# Patient Record
Sex: Female | Born: 1980 | Race: White | Hispanic: No | Marital: Married | Smoking: Never smoker
Health system: Southern US, Community
[De-identification: ages and names within clinical notes are randomized; demographics above are authoritative.]

## PROBLEM LIST (undated history)

## (undated) DIAGNOSIS — I10 Essential (primary) hypertension: Secondary | ICD-10-CM

## (undated) DIAGNOSIS — N809 Endometriosis, unspecified: Secondary | ICD-10-CM

## (undated) DIAGNOSIS — G43909 Migraine, unspecified, not intractable, without status migrainosus: Secondary | ICD-10-CM

## (undated) DIAGNOSIS — E282 Polycystic ovarian syndrome: Secondary | ICD-10-CM

## (undated) HISTORY — PX: OTHER SURGICAL HISTORY: SHX169

## (undated) HISTORY — DX: Polycystic ovarian syndrome: E28.2

## (undated) HISTORY — DX: Migraine, unspecified, not intractable, without status migrainosus: G43.909

## (undated) HISTORY — PX: ADENOIDECTOMY: SUR15

## (undated) HISTORY — DX: Endometriosis, unspecified: N80.9

## (undated) HISTORY — DX: Essential (primary) hypertension: I10

---
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
## 2001-12-31 ENCOUNTER — Other Ambulatory Visit: Admission: RE | Admit: 2001-12-31 | Discharge: 2001-12-31 | Payer: Self-pay | Admitting: *Deleted

## 2002-05-21 ENCOUNTER — Other Ambulatory Visit: Admission: RE | Admit: 2002-05-21 | Discharge: 2002-05-21 | Payer: Self-pay | Admitting: *Deleted

## 2002-10-18 ENCOUNTER — Other Ambulatory Visit: Admission: RE | Admit: 2002-10-18 | Discharge: 2002-10-18 | Payer: Self-pay | Admitting: *Deleted

## 2003-03-20 ENCOUNTER — Other Ambulatory Visit: Admission: RE | Admit: 2003-03-20 | Discharge: 2003-03-20 | Payer: Self-pay | Admitting: *Deleted

## 2006-12-27 ENCOUNTER — Other Ambulatory Visit: Admission: RE | Admit: 2006-12-27 | Discharge: 2006-12-27 | Payer: Self-pay | Admitting: *Deleted

## 2007-06-27 ENCOUNTER — Ambulatory Visit: Payer: Self-pay | Admitting: Family Medicine

## 2007-10-25 HISTORY — PX: WISDOM TOOTH EXTRACTION: SHX21

## 2008-01-09 ENCOUNTER — Other Ambulatory Visit: Admission: RE | Admit: 2008-01-09 | Discharge: 2008-01-09 | Payer: Self-pay | Admitting: *Deleted

## 2009-04-14 ENCOUNTER — Ambulatory Visit: Payer: Self-pay | Admitting: Internal Medicine

## 2015-11-30 ENCOUNTER — Other Ambulatory Visit: Payer: Self-pay | Admitting: Obstetrics and Gynecology

## 2015-11-30 DIAGNOSIS — N644 Mastodynia: Secondary | ICD-10-CM

## 2015-12-03 ENCOUNTER — Ambulatory Visit
Admission: RE | Admit: 2015-12-03 | Discharge: 2015-12-03 | Disposition: A | Discharge status: Home / Self Care | Payer: BLUE CROSS/BLUE SHIELD | Source: Ambulatory Visit | Attending: Obstetrics and Gynecology | Admitting: Obstetrics and Gynecology

## 2015-12-03 ENCOUNTER — Other Ambulatory Visit: Payer: Self-pay | Admitting: Obstetrics and Gynecology

## 2015-12-03 DIAGNOSIS — N644 Mastodynia: Secondary | ICD-10-CM

## 2016-02-04 DIAGNOSIS — H5203 Hypermetropia, bilateral: Secondary | ICD-10-CM | POA: Diagnosis not present

## 2016-08-02 DIAGNOSIS — M531 Cervicobrachial syndrome: Secondary | ICD-10-CM | POA: Diagnosis not present

## 2016-08-02 DIAGNOSIS — M9902 Segmental and somatic dysfunction of thoracic region: Secondary | ICD-10-CM | POA: Diagnosis not present

## 2016-08-02 DIAGNOSIS — M5386 Other specified dorsopathies, lumbar region: Secondary | ICD-10-CM | POA: Diagnosis not present

## 2016-08-16 ENCOUNTER — Ambulatory Visit
Admission: RE | Admit: 2016-08-16 | Discharge: 2016-08-16 | Disposition: A | Discharge status: Home / Self Care | Payer: BLUE CROSS/BLUE SHIELD | Source: Ambulatory Visit | Attending: Physician Assistant | Admitting: Physician Assistant

## 2016-08-16 ENCOUNTER — Other Ambulatory Visit: Payer: Self-pay | Admitting: Physician Assistant

## 2016-08-16 DIAGNOSIS — M546 Pain in thoracic spine: Secondary | ICD-10-CM | POA: Diagnosis not present

## 2016-08-16 DIAGNOSIS — M5387 Other specified dorsopathies, lumbosacral region: Secondary | ICD-10-CM | POA: Diagnosis not present

## 2016-08-16 DIAGNOSIS — R52 Pain, unspecified: Secondary | ICD-10-CM

## 2016-08-16 DIAGNOSIS — R079 Chest pain, unspecified: Secondary | ICD-10-CM | POA: Diagnosis not present

## 2016-08-16 DIAGNOSIS — M5442 Lumbago with sciatica, left side: Secondary | ICD-10-CM | POA: Diagnosis not present

## 2016-08-16 DIAGNOSIS — R0789 Other chest pain: Secondary | ICD-10-CM | POA: Diagnosis not present

## 2016-08-16 DIAGNOSIS — E559 Vitamin D deficiency, unspecified: Secondary | ICD-10-CM | POA: Diagnosis not present

## 2016-08-16 DIAGNOSIS — R635 Abnormal weight gain: Secondary | ICD-10-CM | POA: Diagnosis not present

## 2016-08-16 DIAGNOSIS — R945 Abnormal results of liver function studies: Secondary | ICD-10-CM | POA: Diagnosis not present

## 2016-08-16 DIAGNOSIS — M5441 Lumbago with sciatica, right side: Secondary | ICD-10-CM | POA: Diagnosis not present

## 2016-08-16 DIAGNOSIS — R0781 Pleurodynia: Secondary | ICD-10-CM | POA: Diagnosis not present

## 2016-09-20 DIAGNOSIS — M531 Cervicobrachial syndrome: Secondary | ICD-10-CM | POA: Diagnosis not present

## 2016-09-20 DIAGNOSIS — M5387 Other specified dorsopathies, lumbosacral region: Secondary | ICD-10-CM | POA: Diagnosis not present

## 2016-09-20 DIAGNOSIS — M7541 Impingement syndrome of right shoulder: Secondary | ICD-10-CM | POA: Diagnosis not present

## 2016-12-06 DIAGNOSIS — Z6835 Body mass index (BMI) 35.0-35.9, adult: Secondary | ICD-10-CM | POA: Diagnosis not present

## 2016-12-06 DIAGNOSIS — Z01419 Encounter for gynecological examination (general) (routine) without abnormal findings: Secondary | ICD-10-CM | POA: Diagnosis not present

## 2016-12-06 DIAGNOSIS — Z31430 Encounter of female for testing for genetic disease carrier status for procreative management: Secondary | ICD-10-CM | POA: Diagnosis not present

## 2016-12-06 DIAGNOSIS — R350 Frequency of micturition: Secondary | ICD-10-CM | POA: Diagnosis not present

## 2017-01-25 DIAGNOSIS — L659 Nonscarring hair loss, unspecified: Secondary | ICD-10-CM | POA: Diagnosis not present

## 2017-06-22 DIAGNOSIS — Z30432 Encounter for removal of intrauterine contraceptive device: Secondary | ICD-10-CM | POA: Diagnosis not present

## 2017-12-08 DIAGNOSIS — N926 Irregular menstruation, unspecified: Secondary | ICD-10-CM | POA: Diagnosis not present

## 2017-12-15 DIAGNOSIS — R945 Abnormal results of liver function studies: Secondary | ICD-10-CM | POA: Diagnosis not present

## 2017-12-20 DIAGNOSIS — Z8049 Family history of malignant neoplasm of other genital organs: Secondary | ICD-10-CM | POA: Diagnosis not present

## 2017-12-20 DIAGNOSIS — Z8041 Family history of malignant neoplasm of ovary: Secondary | ICD-10-CM | POA: Diagnosis not present

## 2017-12-20 DIAGNOSIS — Z01419 Encounter for gynecological examination (general) (routine) without abnormal findings: Secondary | ICD-10-CM | POA: Diagnosis not present

## 2017-12-20 DIAGNOSIS — Z801 Family history of malignant neoplasm of trachea, bronchus and lung: Secondary | ICD-10-CM | POA: Diagnosis not present

## 2017-12-20 DIAGNOSIS — Z8042 Family history of malignant neoplasm of prostate: Secondary | ICD-10-CM | POA: Diagnosis not present

## 2017-12-20 DIAGNOSIS — Z6836 Body mass index (BMI) 36.0-36.9, adult: Secondary | ICD-10-CM | POA: Diagnosis not present

## 2017-12-27 ENCOUNTER — Ambulatory Visit: Payer: BLUE CROSS/BLUE SHIELD | Admitting: Internal Medicine

## 2017-12-27 ENCOUNTER — Encounter: Payer: Self-pay | Admitting: Internal Medicine

## 2017-12-27 VITALS — BP 122/80 | HR 88 | Resp 16 | Ht 63.0 in | Wt 218.2 lb

## 2017-12-27 DIAGNOSIS — R635 Abnormal weight gain: Secondary | ICD-10-CM | POA: Diagnosis not present

## 2017-12-27 DIAGNOSIS — R5383 Other fatigue: Secondary | ICD-10-CM

## 2017-12-27 DIAGNOSIS — R748 Abnormal levels of other serum enzymes: Secondary | ICD-10-CM

## 2017-12-27 NOTE — Progress Notes (Signed)
[facility] [place], [place] [address]  Internal MEDICINE  Office Visit Note  Patient Name: Jennifer Barron  [id number]  [id number]  Date of Service: 12/28/2017   Complaints/HPI Pt is here for establishment of PCP. She had her labs done by her obgyn which showed elevated transaminases. She does need take any medications and concerned about it. No h/o of increased risk factors for this. She is concerned about her fatigue and weight gain. Denies any problems with her menses.   Current Medication: No outpatient encounter medications on file as of 12/27/2017.   No facility-administered encounter medications on file as of 12/27/2017.     Surgical History: Past Surgical History:  Procedure Laterality Date  . abdominal laproscopy    . ADENOIDECTOMY     twice  . TONSILLECTOMY Bilateral 1987  . WISDOM TOOTH EXTRACTION  2009    Medical History: Past Medical History:  Diagnosis Date  . Endometriosis     Family History: Family History  Problem Relation Age of Onset  . Ovarian cancer Paternal Grandmother   . Prostate cancer Paternal Grandfather     Social History   Socioeconomic History  . Marital status: Married    Spouse name: Not on file  . Number of children: Not on file  . Years of education: Not on file  . Highest education level: Not on file  Social Needs  . Financial resource strain: Not on file  . Food insecurity - worry: Not on file  . Food insecurity - inability: Not on file  . Transportation needs - medical: Not on file  . Transportation needs - non-medical: Not on file  Occupational History  . Not on file  Tobacco Use  . Smoking status: Never Smoker  . Smokeless tobacco: Never Used  Substance and Sexual Activity  . Alcohol use: Yes    Frequency: Never    Comment: social  . Drug use: No  . Sexual activity: Not on file  Other Topics Concern  . Not on file  Social History Narrative  . Not on file     Review of Systems   Constitutional: Positive for fatigue and unexpected weight change. Negative for chills.  HENT: Negative for congestion, postnasal drip, rhinorrhea, sneezing and sore throat.   Eyes: Negative for redness.  Respiratory: Negative for cough, chest tightness and shortness of breath.   Cardiovascular: Negative for chest pain and palpitations.  Gastrointestinal: Negative for abdominal pain, constipation, diarrhea, nausea and vomiting.  Genitourinary: Negative for dysuria and frequency.  Musculoskeletal: Negative for arthralgias, back pain, joint swelling and neck pain.  Skin: Negative for rash.  Neurological: Negative.  Negative for tremors and numbness.  Hematological: Negative for adenopathy. Does not bruise/bleed easily.  Psychiatric/Behavioral: Negative for behavioral problems (Depression), sleep disturbance and suicidal ideas. The patient is not nervous/anxious.     Vital Signs: BP 122/80   Pulse 88   Resp 16   Ht 5' 3"  (1.6 m)   Wt 218 lb 3.2 oz (99 kg)   SpO2 98%   BMI 38.65 kg/m    Physical Exam  Constitutional: She is oriented to person, place, and time. She appears well-developed and well-nourished. No distress.  HENT:  Head: Normocephalic and atraumatic.  Mouth/Throat: Oropharynx is clear and moist.  Eyes: EOM are normal. Pupils are equal, round, and reactive to light.  Neck: Normal range of motion. Neck supple.  Cardiovascular: Normal rate, regular rhythm and normal heart sounds. Exam reveals no gallop and no  friction rub.  No murmur heard. Pulmonary/Chest: Effort normal.  Abdominal: Soft. Bowel sounds are normal.  Musculoskeletal: Normal range of motion.  Neurological: She is alert and oriented to person, place, and time. No cranial nerve deficit.  Skin: Skin is warm and dry. She is not diaphoretic.  Psychiatric: She has a normal mood and affect. Her behavior is normal. Judgment and thought content normal.    Assessment/Plan: 1. Abnormal liver enzymes -  Fe+TIBC+Fer - Hepatitis B Surface AntiGEN - Hepatitis c vrs RNA detect by PCR-qual - US Abdomen Complete; Future  2. Abnormal weight gain - Fe+TIBC+Fer - TSH+T4F+T3Free - Thyroglobulin antibody - Thyroid peroxidase antibody  3. Fatigue, unspecified type - Fe+TIBC+Fer - B12 and Folate Panel  General Counseling: Jennifer Barron verbalizes understanding of the findings of todays visit and agrees with plan of treatment. I have discussed any further diagnostic evaluation that may be needed or ordered today. We also reviewed her medications today. she has been encouraged to call the office with any questions or concerns that should arise related to todays visit. Obesity Counseling: Risk Assessment: An assessment of behavioral risk factors was made today and includes lack of exercise sedentary lifestyle, lack of portion control and poor dietary habits.  Risk Modification Advice: She was counseled on portion control guidelines. Restricting daily caloric intake to. . The detrimental long term effects of obesity on her health and ongoing poor compliance was also discussed with the patient.   Orders Placed This Encounter  Procedures  . US Abdomen Complete  . Fe+TIBC+Fer  . Hepatitis B Surface AntiGEN  . Hepatitis c vrs RNA detect by PCR-qual  . TSH+T4F+T3Free  . Thyroglobulin antibody  . Thyroid peroxidase antibody    Time spent:25 Minutes

## 2017-12-28 ENCOUNTER — Encounter: Payer: Self-pay | Admitting: Internal Medicine

## 2018-01-08 ENCOUNTER — Other Ambulatory Visit: Payer: Self-pay

## 2018-01-12 ENCOUNTER — Ambulatory Visit (INDEPENDENT_AMBULATORY_CARE_PROVIDER_SITE_OTHER): Payer: BLUE CROSS/BLUE SHIELD

## 2018-01-12 DIAGNOSIS — R5383 Other fatigue: Secondary | ICD-10-CM | POA: Diagnosis not present

## 2018-01-12 DIAGNOSIS — R748 Abnormal levels of other serum enzymes: Secondary | ICD-10-CM | POA: Diagnosis not present

## 2018-01-12 DIAGNOSIS — R635 Abnormal weight gain: Secondary | ICD-10-CM | POA: Diagnosis not present

## 2018-01-12 LAB — HM HEPATITIS C SCREENING LAB: HM Hepatitis Screen: NEGATIVE

## 2018-01-13 LAB — B12 AND FOLATE PANEL
Folate: 4.7 ng/mL (ref >3.0)
Vitamin B-12: 712 pg/mL (ref 232–1245)

## 2018-01-14 LAB — IRON,TIBC AND FERRITIN PANEL
Ferritin: 547 ng/mL — ABNORMAL HIGH (ref 15–150)
Iron Saturation: 33 % (ref 15–55)
Iron: 121 ug/dL (ref 27–159)
Total Iron Binding Capacity: 365 ug/dL (ref 250–450)
UIBC: 244 ug/dL (ref 131–425)

## 2018-01-14 LAB — HEPATITIS B SURFACE ANTIGEN: HEP B S AG: NEGATIVE

## 2018-01-14 LAB — TSH+T4F+T3FREE
Free T4: 1.36 ng/dL (ref 0.82–1.77)
T3, Free: 3.5 pg/mL (ref 2.0–4.4)
TSH: 1.9 u[IU]/mL (ref 0.450–4.500)

## 2018-01-14 LAB — HEPATITIS C VRS RNA DETECT BY PCR-QUAL: HCV RNA NAA QUALITATIVE: NEGATIVE

## 2018-01-15 ENCOUNTER — Other Ambulatory Visit: Payer: Self-pay

## 2018-01-23 ENCOUNTER — Encounter: Payer: Self-pay | Admitting: Internal Medicine

## 2018-01-23 ENCOUNTER — Ambulatory Visit: Payer: BLUE CROSS/BLUE SHIELD | Admitting: Internal Medicine

## 2018-01-23 VITALS — BP 126/90 | HR 83 | Resp 16 | Ht 62.0 in | Wt 213.8 lb

## 2018-01-23 DIAGNOSIS — K760 Fatty (change of) liver, not elsewhere classified: Secondary | ICD-10-CM | POA: Diagnosis not present

## 2018-01-23 DIAGNOSIS — R7989 Other specified abnormal findings of blood chemistry: Secondary | ICD-10-CM

## 2018-01-23 DIAGNOSIS — N809 Endometriosis, unspecified: Secondary | ICD-10-CM | POA: Insufficient documentation

## 2018-01-23 MED ORDER — PHENTERMINE HCL 37.5 MG PO CAPS: 37.5000 mg | ORAL_CAPSULE | ORAL | 0 refills | Status: DC

## 2018-01-23 NOTE — Progress Notes (Signed)
[facility] [place], [place] [address]  Internal MEDICINE  Office Visit Note  Patient Name: Jennifer Barron  [id number]  [id number]  Date of Service: 02/01/2018  Chief Complaint  Patient presents with  . Results    labs, ultrasound    HPI Pt is here for routine follow up.  She had her labs done by her obgyn which showed elevated transaminases. She does need take any medications and concerned about it. No h/o of increased risk factors for this. She is concerned about her fatigue and weight gain. Will like to start weight loss programe Denies any problems with her menses. Ultrasound of the abdomen was ordered along with hepatic profile. Pt is here to discuss result  Current Medication: Outpatient Encounter Medications as of 01/23/2018  Medication Sig  . phentermine 37.5 MG capsule Take 1 capsule (37.5 mg total) by mouth every morning. (Patient not taking: Reported on 01/31/2018)   No facility-administered encounter medications on file as of 01/23/2018.     Surgical History: Past Surgical History:  Procedure Laterality Date  . abdominal laproscopy    . ADENOIDECTOMY     twice  . TONSILLECTOMY Bilateral 1987  . WISDOM TOOTH EXTRACTION  2009    Medical History: Past Medical History:  Diagnosis Date  . Endometriosis   . Migraine     Family History: Family History  Problem Relation Age of Onset  . Ovarian cancer Paternal Grandmother   . Cancer Paternal Grandmother   . Prostate cancer Paternal Grandfather   . Cancer Paternal Grandfather   . Cancer Father     Social History   Socioeconomic History  . Marital status: Married    Spouse name: Not on file  . Number of children: Not on file  . Years of education: Not on file  . Highest education level: Not on file  Occupational History  . Not on file  Social Needs  . Financial resource strain: Not on file  . Food insecurity:    Worry: Not on file    Inability: Not on file  .  Transportation needs:    Medical: Not on file    Non-medical: Not on file  Tobacco Use  . Smoking status: Never Smoker  . Smokeless tobacco: Never Used  Substance and Sexual Activity  . Alcohol use: Yes    Frequency: Never    Comment: social  . Drug use: No  . Sexual activity: Yes  Lifestyle  . Physical activity:    Days per week: Not on file    Minutes per session: Not on file  . Stress: Not on file  Relationships  . Social connections:    Talks on phone: Not on file    Gets together: Not on file    Attends religious service: Not on file    Active member of club or organization: Not on file    Attends meetings of clubs or organizations: Not on file    Relationship status: Not on file  . Intimate partner violence:    Fear of current or ex partner: Not on file    Emotionally abused: Not on file    Physically abused: Not on file    Forced sexual activity: Not on file  Other Topics Concern  . Not on file  Social History Narrative  . Not on file   Review of Systems  Constitutional: Positive for fatigue and unexpected weight change. Negative for chills.  HENT: Negative for congestion, postnasal drip, rhinorrhea,  sneezing and sore throat.   Eyes: Negative for redness.  Respiratory: Negative for cough, chest tightness and shortness of breath.   Cardiovascular: Negative for chest pain and palpitations.  Gastrointestinal: Negative for abdominal pain, constipation, diarrhea, nausea and vomiting.  Genitourinary: Negative for dysuria and frequency.  Musculoskeletal: Negative for arthralgias, back pain, joint swelling and neck pain.  Skin: Negative for rash.  Neurological: Negative.  Negative for tremors and numbness.  Hematological: Negative for adenopathy. Does not bruise/bleed easily.  Psychiatric/Behavioral: Negative for behavioral problems (Depression), sleep disturbance and suicidal ideas. The patient is not nervous/anxious.     Vital Signs: BP 126/90 (BP Location: Left  Arm, Patient Position: Sitting)   Pulse 83   Resp 16   Ht 5' 2"  (1.575 m)   Wt 213 lb 12.8 oz (97 kg)   SpO2 98%   BMI 39.10 kg/m    Physical Exam  Constitutional: She is oriented to person, place, and time. She appears well-developed and well-nourished. No distress.  HENT:  Head: Normocephalic and atraumatic.  Mouth/Throat: Oropharynx is clear and moist.  Eyes: EOM are normal. Pupils are equal, round, and reactive to light.  Neck: Normal range of motion. Neck supple.  Cardiovascular: Normal rate, regular rhythm and normal heart sounds. Exam reveals no gallop and no friction rub.  No murmur heard. Pulmonary/Chest: Effort normal.  Abdominal: Soft. Bowel sounds are normal.  Musculoskeletal: Normal range of motion.  Neurological: She is alert and oriented to person, place, and time. No cranial nerve deficit.  Skin: Skin is warm and dry. She is not diaphoretic.  Psychiatric: She has a normal mood and affect. Her behavior is normal. Judgment and thought content normal.  Assessment/Plan: 1. Elevated ferritin - Ambulatory referral to Hematology.   2. Fatty liver disease, nonalcoholic - Monitor for now   3. Morbid obesity (HCC) - phentermine 37.5 MG capsule; Take 1 capsule (37.5 mg total) by mouth every morning. (Patient not taking: Reported on 01/31/2018)  Dispense: 30 capsule; Refill: 0 - Metabolic Test   General Counseling: Jennifer Barron verbalizes understanding of the findings of todays visit and agrees with plan of treatment. I have discussed any further diagnostic evaluation that may be needed or ordered today. We also reviewed her medications today. she has been encouraged to call the office with any questions or concerns that should arise related to todays visit. Obesity Counseling: Risk Assessment: An assessment of behavioral risk factors was made today and includes lack of exercise sedentary lifestyle, lack of portion control and poor dietary habits.  Risk Modification Advice: She  was counseled on portion control guidelines. Restricting daily caloric intake to. . The detrimental long term effects of obesity on her health and ongoing poor compliance was also discussed with the patient.   Orders Placed This Encounter  Procedures  . Metabolic Test  . Ambulatory referral to Hematology    Meds ordered this encounter  Medications  . phentermine 37.5 MG capsule    Sig: Take 1 capsule (37.5 mg total) by mouth every morning.    Dispense:  30 capsule    Refill:  0    Time spent:25 Minutes  Dr Lavera Guise Internal medicine

## 2018-01-24 DIAGNOSIS — L700 Acne vulgaris: Secondary | ICD-10-CM | POA: Diagnosis not present

## 2018-01-24 DIAGNOSIS — D2272 Melanocytic nevi of left lower limb, including hip: Secondary | ICD-10-CM | POA: Diagnosis not present

## 2018-01-24 DIAGNOSIS — D2261 Melanocytic nevi of right upper limb, including shoulder: Secondary | ICD-10-CM | POA: Diagnosis not present

## 2018-01-24 DIAGNOSIS — D2262 Melanocytic nevi of left upper limb, including shoulder: Secondary | ICD-10-CM | POA: Diagnosis not present

## 2018-01-31 ENCOUNTER — Inpatient Hospital Stay: Payer: BLUE CROSS/BLUE SHIELD | Attending: Oncology | Admitting: Oncology

## 2018-01-31 ENCOUNTER — Other Ambulatory Visit: Payer: Self-pay

## 2018-01-31 ENCOUNTER — Inpatient Hospital Stay: Payer: BLUE CROSS/BLUE SHIELD

## 2018-01-31 ENCOUNTER — Encounter: Payer: Self-pay | Admitting: Oncology

## 2018-01-31 VITALS — BP 138/93 | HR 86 | Temp 98.5°F | Resp 16 | Wt 214.2 lb

## 2018-01-31 DIAGNOSIS — R7989 Other specified abnormal findings of blood chemistry: Secondary | ICD-10-CM

## 2018-01-31 DIAGNOSIS — R740 Nonspecific elevation of levels of transaminase and lactic acid dehydrogenase [LDH]: Secondary | ICD-10-CM | POA: Diagnosis not present

## 2018-01-31 DIAGNOSIS — R17 Unspecified jaundice: Secondary | ICD-10-CM

## 2018-01-31 DIAGNOSIS — E559 Vitamin D deficiency, unspecified: Secondary | ICD-10-CM | POA: Diagnosis not present

## 2018-01-31 LAB — CBC WITH DIFFERENTIAL/PLATELET
BASOS ABS: 0 10*3/uL (ref 0–0.1)
BASOS PCT: 1 %
Eosinophils Absolute: 0.2 10*3/uL (ref 0–0.7)
Eosinophils Relative: 3 %
HEMATOCRIT: 43.5 % (ref 35.0–47.0)
HEMOGLOBIN: 15.8 g/dL (ref 12.0–16.0)
LYMPHS PCT: 34 %
Lymphs Abs: 2.5 10*3/uL (ref 1.0–3.6)
MCH: 34.1 pg — ABNORMAL HIGH (ref 26.0–34.0)
MCHC: 36.2 g/dL — ABNORMAL HIGH (ref 32.0–36.0)
MCV: 94.2 fL (ref 80.0–100.0)
MONOS PCT: 9 %
Monocytes Absolute: 0.6 10*3/uL (ref 0.2–0.9)
NEUTROS ABS: 3.8 10*3/uL (ref 1.4–6.5)
NEUTROS PCT: 53 %
Platelets: 289 10*3/uL (ref 150–440)
RBC: 4.62 MIL/uL (ref 3.80–5.20)
RDW: 11.9 % (ref 11.5–14.5)
WBC: 7.2 10*3/uL (ref 3.6–11.0)

## 2018-01-31 LAB — COMPREHENSIVE METABOLIC PANEL
ALT: 282 U/L — ABNORMAL HIGH (ref 14–54)
ANION GAP: 8 (ref 5–15)
AST: 184 U/L — ABNORMAL HIGH (ref 15–41)
Albumin: 4.7 g/dL (ref 3.5–5.0)
Alkaline Phosphatase: 57 U/L (ref 38–126)
BILIRUBIN TOTAL: 1.4 mg/dL — AB (ref 0.3–1.2)
BUN: 9 mg/dL (ref 6–20)
CALCIUM: 9.6 mg/dL (ref 8.9–10.3)
CO2: 25 mmol/L (ref 22–32)
CREATININE: 0.81 mg/dL (ref 0.44–1.00)
Chloride: 99 mmol/L — ABNORMAL LOW (ref 101–111)
Glucose, Bld: 103 mg/dL — ABNORMAL HIGH (ref 65–99)
Potassium: 4 mmol/L (ref 3.5–5.1)
Sodium: 132 mmol/L — ABNORMAL LOW (ref 135–145)
TOTAL PROTEIN: 8 g/dL (ref 6.5–8.1)

## 2018-01-31 NOTE — Progress Notes (Signed)
New patient in for evaluation elevated ferritin.  Pt reports being tired and fatigue, some shortness of breath on exertion.

## 2018-02-01 LAB — PTH, INTACT AND CALCIUM
CALCIUM TOTAL (PTH): 9.8 mg/dL (ref 8.7–10.2)
PTH: 42 pg/mL (ref 15–65)

## 2018-02-01 LAB — VITAMIN D 25 HYDROXY (VIT D DEFICIENCY, FRACTURES): Vit D, 25-Hydroxy: 15.1 ng/mL — ABNORMAL LOW (ref 30.0–100.0)

## 2018-02-01 MED ORDER — VITAMIN D 1000 UNITS PO TABS: 1000.0000 [IU] | ORAL_TABLET | Freq: Every day | ORAL | 0 refills | Status: AC

## 2018-02-01 NOTE — Progress Notes (Signed)
Hematology/Oncology Consult note [facility] [phone] [phone]   Patient Care Team: Lavera Guise, MD as PCP - General (Internal Medicine)  REFERRING PROVIDER: Lavera Guise, MD CHIEF COMPLAINTS/PURPOSE OF CONSULTATION:  Evaluation of elevated ferritin.  HISTORY OF PRESENTING ILLNESS:  Jennifer Barron Morning is a  37 y.o.  female with PMH listed below who was referred to me for evaluation of elevated ferritin. Patient follows up with GYN for IUD placement, and was fine to have abnormal liver function test, mainly elevated transaminitis.  Patient went to primary care physician Dr. Humphrey Rolls And had additional testing.  She was found to have elevated ferritin level at 547, iron saturation 33, ultrasound of abdomen was done on January 12, 2018, which showed upper normal limits spleen size and increased echogenicity of liver questionable fatty liver. Patient was referred to [facility] for further evaluation of elevated ferritin. Currently patient takes spironolactone for acne and this was started by dermatologist a week ago.  She also takes phentermine for obesity, recently started. Patient reports feeling tired fatigue.  Denies any joint pain shortness of breath, lower extremity swelling.  She denies any family history of hemochromatosis. Review of Systems  Constitutional: Positive for malaise/fatigue. Negative for chills, fever and weight loss.  HENT: Negative for hearing loss and nosebleeds.   Eyes: Negative for double vision and photophobia.  Cardiovascular: Negative for chest pain, orthopnea and claudication.  Gastrointestinal: Negative for nausea and vomiting.  Genitourinary: Negative for dysuria and urgency.  Musculoskeletal: Negative for myalgias and neck pain.  Skin: Negative for rash.  Neurological: Negative for dizziness, tingling and tremors.  Endo/Heme/Allergies: Does not bruise/bleed easily.  Psychiatric/Behavioral: Negative for depression and  substance abuse.    MEDICAL HISTORY:  Past Medical History:  Diagnosis Date  . Endometriosis   . Migraine     SURGICAL HISTORY: Past Surgical History:  Procedure Laterality Date  . abdominal laproscopy    . ADENOIDECTOMY     twice  . TONSILLECTOMY Bilateral 1987  . WISDOM TOOTH EXTRACTION  2009    SOCIAL HISTORY: Social History   Socioeconomic History  . Marital status: Married    Spouse name: Not on file  . Number of children: Not on file  . Years of education: Not on file  . Highest education level: Not on file  Occupational History  . Not on file  Social Needs  . Financial resource strain: Not on file  . Food insecurity:    Worry: Not on file    Inability: Not on file  . Transportation needs:    Medical: Not on file    Non-medical: Not on file  Tobacco Use  . Smoking status: Never Smoker  . Smokeless tobacco: Never Used  Substance and Sexual Activity  . Alcohol use: Yes    Frequency: Never    Comment: social  . Drug use: No  . Sexual activity: Yes  Lifestyle  . Physical activity:    Days per week: Not on file    Minutes per session: Not on file  . Stress: Not on file  Relationships  . Social connections:    Talks on phone: Not on file    Gets together: Not on file    Attends religious service: Not on file    Active member of club or organization: Not on file    Attends meetings of clubs or organizations: Not on file    Relationship status: Not on file  . Intimate partner violence:  Fear of current or ex partner: Not on file    Emotionally abused: Not on file    Physically abused: Not on file    Forced sexual activity: Not on file  Other Topics Concern  . Not on file  Social History Narrative  . Not on file    FAMILY HISTORY: Family History  Problem Relation Age of Onset  . Ovarian cancer Paternal Grandmother   . Cancer Paternal Grandmother   . Prostate cancer Paternal Grandfather   . Cancer Paternal Grandfather   . Cancer Father      ALLERGIES:  has No Known Allergies.  MEDICATIONS:  Current Outpatient Medications  Medication Sig Dispense Refill  . spironolactone (ALDACTONE) 50 MG tablet   4  . phentermine 37.5 MG capsule Take 1 capsule (37.5 mg total) by mouth every morning. (Patient not taking: Reported on 01/31/2018) 30 capsule 0   No current facility-administered medications for this visit.      PHYSICAL EXAMINATION: ECOG PERFORMANCE STATUS: 1 - Symptomatic but completely ambulatory Vitals:   01/31/18 1456  BP: (!) 138/93  Pulse: 86  Resp: 16  Temp: 98.5 F (36.9 C)  SpO2: 97%   Filed Weights   01/31/18 1456  Weight: 214 lb 3 oz (97.2 kg)    Physical Exam  Constitutional: She is oriented to person, place, and time. She appears well-developed and well-nourished. No distress.  HENT:  Head: Normocephalic and atraumatic.  Eyes: Pupils are equal, round, and reactive to light. Conjunctivae and EOM are normal.  Neck: Normal range of motion. Neck supple.  Cardiovascular: Normal rate and regular rhythm.  No murmur heard. Pulmonary/Chest: Effort normal and breath sounds normal. She has no wheezes.  Abdominal: Soft. Bowel sounds are normal.  Musculoskeletal: Normal range of motion. She exhibits no edema.  Lymphadenopathy:    She has no cervical adenopathy.  Neurological: She is alert and oriented to person, place, and time. She displays normal reflexes. Coordination normal.  Skin: Skin is warm and dry.  Psychiatric: She has a normal mood and affect. Her behavior is normal.     LABORATORY DATA:  I have reviewed the data as listed Lab Results  Component Value Date   WBC 7.2 01/31/2018   HGB 15.8 01/31/2018   HCT 43.5 01/31/2018   MCV 94.2 01/31/2018   PLT 289 01/31/2018   Recent Labs    01/31/18 1515  NA 132*  K 4.0  CL 99*  CO2 25  GLUCOSE 103*  BUN 9  CREATININE 0.81  CALCIUM 9.6  9.8  GFRNONAA >60  GFRAA >60  PROT 8.0  ALBUMIN 4.7  AST 184*  ALT 282*  ALKPHOS 57  BILITOT  1.4*    Hepatitis B surface antigen negative, hepatitis C RNA nondetectable.   ASSESSMENT & PLAN:  1. Elevated ferritin   2. Hypercalcemia    Patient brought her lab work was done at [facility].  In addition to transaminitis, she also has a slightly high calcium level, this can be due to dehydration or secondary to underlying malignancy.  Discussed with patient that ferritin can reflect her iron store but also can serve as acute phase reactant.  Her iron saturation appears normal.   2 weeks to discuss results.  I will obtain some lab workup first including repeating kidney and liver function, CBC, PTH, vitamin D 25-hydroxy, hemochromatosis DNA PCR.   Lab results were reviewed. Calcium level normalized. She has transaminitis, and hyperbilirubinemia with a bilirubin of 1.4.  We will add  direct bilirubin test.  Start vitamin D supplements 1000 unit daily. Rx sent to her pharmacy. Repeat Vitamin D level in 3 months.  All questions were answered. The patient knows to call the clinic with any problems questions or concerns.  Return of visit: 2 weeks  Dr. Humphrey Rolls, thank you for this kind referral and the opportunity to participate in the care of this patient. A copy of today's note is routed to referring provider    Earlie Server, MD, PhD Hematology Oncology [facility] Pager- [phone] 02/01/2018

## 2018-02-02 ENCOUNTER — Encounter: Payer: Self-pay | Admitting: *Deleted

## 2018-02-05 LAB — HEMOCHROMATOSIS DNA-PCR(C282Y,H63D)

## 2018-02-06 DIAGNOSIS — Z809 Family history of malignant neoplasm, unspecified: Secondary | ICD-10-CM | POA: Diagnosis not present

## 2018-02-08 ENCOUNTER — Inpatient Hospital Stay: Payer: BLUE CROSS/BLUE SHIELD

## 2018-02-08 DIAGNOSIS — R740 Nonspecific elevation of levels of transaminase and lactic acid dehydrogenase [LDH]: Secondary | ICD-10-CM | POA: Diagnosis not present

## 2018-02-08 DIAGNOSIS — R7989 Other specified abnormal findings of blood chemistry: Secondary | ICD-10-CM | POA: Diagnosis not present

## 2018-02-08 DIAGNOSIS — R17 Unspecified jaundice: Secondary | ICD-10-CM | POA: Diagnosis not present

## 2018-02-08 DIAGNOSIS — E559 Vitamin D deficiency, unspecified: Secondary | ICD-10-CM | POA: Diagnosis not present

## 2018-02-08 LAB — CBC WITH DIFFERENTIAL/PLATELET
Basophils Absolute: 0.1 10*3/uL (ref 0–0.1)
Basophils Relative: 1 %
Eosinophils Absolute: 0.2 10*3/uL (ref 0–0.7)
Eosinophils Relative: 3 %
HCT: 43.4 % (ref 35.0–47.0)
Hemoglobin: 15.6 g/dL (ref 12.0–16.0)
Lymphocytes Relative: 26 %
Lymphs Abs: 1.9 10*3/uL (ref 1.0–3.6)
MCH: 34.1 pg — ABNORMAL HIGH (ref 26.0–34.0)
MCHC: 35.9 g/dL (ref 32.0–36.0)
MCV: 95 fL (ref 80.0–100.0)
Monocytes Absolute: 0.7 10*3/uL (ref 0.2–0.9)
Monocytes Relative: 9 %
Neutro Abs: 4.5 10*3/uL (ref 1.4–6.5)
Neutrophils Relative %: 61 %
Platelets: 254 10*3/uL (ref 150–440)
RBC: 4.57 MIL/uL (ref 3.80–5.20)
RDW: 12.2 % (ref 11.5–14.5)
WBC: 7.3 10*3/uL (ref 3.6–11.0)

## 2018-02-08 LAB — COMPREHENSIVE METABOLIC PANEL
ALBUMIN: 4.5 g/dL (ref 3.5–5.0)
ALK PHOS: 48 U/L (ref 38–126)
ALT: 238 U/L — ABNORMAL HIGH (ref 14–54)
ANION GAP: 8 (ref 5–15)
AST: 141 U/L — AB (ref 15–41)
BUN: 11 mg/dL (ref 6–20)
CALCIUM: 9.6 mg/dL (ref 8.9–10.3)
CO2: 24 mmol/L (ref 22–32)
Chloride: 96 mmol/L — ABNORMAL LOW (ref 101–111)
Creatinine, Ser: 0.88 mg/dL (ref 0.44–1.00)
GFR calc Af Amer: >60 mL/min (ref >60)
GLUCOSE: 91 mg/dL (ref 65–99)
Potassium: 4 mmol/L (ref 3.5–5.1)
Sodium: 128 mmol/L — ABNORMAL LOW (ref 135–145)
Total Bilirubin: 2.1 mg/dL — ABNORMAL HIGH (ref 0.3–1.2)
Total Protein: 7.5 g/dL (ref 6.5–8.1)

## 2018-02-08 LAB — BILIRUBIN, DIRECT: Bilirubin, Direct: 0.3 mg/dL (ref 0.1–0.5)

## 2018-02-13 ENCOUNTER — Encounter: Payer: Self-pay | Admitting: Oncology

## 2018-02-13 ENCOUNTER — Other Ambulatory Visit: Payer: Self-pay

## 2018-02-13 ENCOUNTER — Inpatient Hospital Stay: Payer: BLUE CROSS/BLUE SHIELD

## 2018-02-13 ENCOUNTER — Inpatient Hospital Stay (HOSPITAL_BASED_OUTPATIENT_CLINIC_OR_DEPARTMENT_OTHER): Payer: BLUE CROSS/BLUE SHIELD | Admitting: Oncology

## 2018-02-13 DIAGNOSIS — E559 Vitamin D deficiency, unspecified: Secondary | ICD-10-CM | POA: Diagnosis not present

## 2018-02-13 DIAGNOSIS — R7401 Elevation of levels of liver transaminase levels: Secondary | ICD-10-CM

## 2018-02-13 DIAGNOSIS — R7989 Other specified abnormal findings of blood chemistry: Secondary | ICD-10-CM | POA: Diagnosis not present

## 2018-02-13 DIAGNOSIS — R740 Nonspecific elevation of levels of transaminase and lactic acid dehydrogenase [LDH]: Secondary | ICD-10-CM | POA: Diagnosis not present

## 2018-02-13 DIAGNOSIS — Z148 Genetic carrier of other disease: Secondary | ICD-10-CM | POA: Insufficient documentation

## 2018-02-13 DIAGNOSIS — R17 Unspecified jaundice: Secondary | ICD-10-CM | POA: Diagnosis not present

## 2018-02-13 LAB — FERRITIN: FERRITIN: 466 ng/mL — AB (ref 11–307)

## 2018-02-13 LAB — BASIC METABOLIC PANEL
Anion gap: 9 (ref 5–15)
BUN: 10 mg/dL (ref 6–20)
CALCIUM: 10 mg/dL (ref 8.9–10.3)
CHLORIDE: 101 mmol/L (ref 101–111)
CO2: 26 mmol/L (ref 22–32)
CREATININE: 0.77 mg/dL (ref 0.44–1.00)
GFR calc Af Amer: >60 mL/min (ref >60)
GFR calc non Af Amer: >60 mL/min (ref >60)
GLUCOSE: 110 mg/dL — AB (ref 65–99)
Potassium: 4.4 mmol/L (ref 3.5–5.1)
Sodium: 136 mmol/L (ref 135–145)

## 2018-02-13 LAB — CBC
HEMATOCRIT: 44.8 % (ref 36.0–46.0)
HEMOGLOBIN: 16.2 g/dL — AB (ref 12.0–15.0)
MCH: 34.4 pg — AB (ref 26.0–34.0)
MCHC: 36.1 g/dL — AB (ref 30.0–36.0)
MCV: 95.2 fL (ref 78.0–100.0)
Platelets: 268 10*3/uL (ref 150–400)
RBC: 4.7 MIL/uL (ref 3.87–5.11)
RDW: 12.2 % (ref 11.5–15.5)
WBC: 5.6 10*3/uL (ref 4.0–10.5)

## 2018-02-13 LAB — DAT, POLYSPECIFIC AHG (ARMC ONLY): POLYSPECIFIC AHG TEST: NEGATIVE

## 2018-02-13 LAB — IRON AND TIBC
IRON: 131 ug/dL (ref 28–170)
Saturation Ratios: 31 % (ref 10.4–31.8)
TIBC: 426 ug/dL (ref 250–450)
UIBC: 295 ug/dL

## 2018-02-13 LAB — LACTATE DEHYDROGENASE: LDH: 207 U/L — ABNORMAL HIGH (ref 98–192)

## 2018-02-13 NOTE — Progress Notes (Signed)
Patient here today for follow up.   

## 2018-02-13 NOTE — Progress Notes (Signed)
Hematology/Oncology Follow up note [facility] Telephone:(336) [phone] [phone]   Patient Care Team: Lavera Guise, MD as PCP - General (Internal Medicine)  REFERRING PROVIDER: Lavera Guise, MD CHIEF COMPLAINTS/PURPOSE OF CONSULTATION:  Evaluation of elevated ferritin.  HISTORY OF PRESENTING ILLNESS:  Jennifer Barron is a  37 y.o.  female with PMH listed below who was referred to me for evaluation of elevated ferritin. Patient follows up with GYN for IUD placement, and was fine to have abnormal liver function test, mainly elevated transaminitis.  Patient went to primary care physician Dr. Humphrey Rolls And had additional testing.  She was found to have elevated ferritin level at 547, iron saturation 33, ultrasound of abdomen was done on January 12, 2018, which showed upper normal limits spleen size and increased echogenicity of liver questionable fatty liver. Patient was referred to [facility] for further evaluation of elevated ferritin. Currently patient takes spironolactone for acne and this was started by dermatologist a week ago.  She also takes phentermine for obesity, recently started. Patient reports feeling tired fatigue.  Denies any joint pain shortness of breath, lower extremity swelling.  She denies any family history of hemochromatosis.  INTERVAL HISTORY Jennifer Barron is a 37 y.o. female who has above history reviewed by me today presents for follow up visit for management of iron overload.  She has had labs done and present to discuss result. Continues to feel fatigued.   Problems and complaints are listed below: Review of Systems  Constitutional: Positive for malaise/fatigue. Negative for chills, diaphoresis, fever and weight loss.  HENT: Negative for ear discharge, hearing loss and nosebleeds.   Eyes: Negative for double vision, photophobia and pain.  Respiratory: Negative for cough, hemoptysis and sputum production.   Cardiovascular: Negative  for chest pain, palpitations, orthopnea, claudication and leg swelling.  Gastrointestinal: Negative for abdominal pain, nausea and vomiting.  Genitourinary: Negative for dysuria, frequency and urgency.  Musculoskeletal: Negative for myalgias and neck pain.  Skin: Negative for itching and rash.  Neurological: Negative for dizziness, tingling, tremors and sensory change.  Endo/Heme/Allergies: Does not bruise/bleed easily.  Psychiatric/Behavioral: Negative for depression, hallucinations and substance abuse.    MEDICAL HISTORY:  Past Medical History:  Diagnosis Date  . Endometriosis   . Migraine     SURGICAL HISTORY: Past Surgical History:  Procedure Laterality Date  . abdominal laproscopy    . ADENOIDECTOMY     twice  . TONSILLECTOMY Bilateral 1987  . WISDOM TOOTH EXTRACTION  2009    SOCIAL HISTORY: Social History   Socioeconomic History  . Marital status: Married    Spouse name: Not on file  . Number of children: Not on file  . Years of education: Not on file  . Highest education level: Not on file  Occupational History  . Not on file  Social Needs  . Financial resource strain: Not on file  . Food insecurity:    Worry: Not on file    Inability: Not on file  . Transportation needs:    Medical: Not on file    Non-medical: Not on file  Tobacco Use  . Smoking status: Never Smoker  . Smokeless tobacco: Never Used  Substance and Sexual Activity  . Alcohol use: Yes    Frequency: Never    Comment: social  . Drug use: No  . Sexual activity: Yes  Lifestyle  . Physical activity:    Days per week: Not on file    Minutes per session: Not on  file  . Stress: Not on file  Relationships  . Social connections:    Talks on phone: Not on file    Gets together: Not on file    Attends religious service: Not on file    Active member of club or organization: Not on file    Attends meetings of clubs or organizations: Not on file    Relationship status: Not on file  . Intimate  partner violence:    Fear of current or ex partner: Not on file    Emotionally abused: Not on file    Physically abused: Not on file    Forced sexual activity: Not on file  Other Topics Concern  . Not on file  Social History Narrative  . Not on file    FAMILY HISTORY: Family History  Problem Relation Age of Onset  . Ovarian cancer Paternal Grandmother   . Cancer Paternal Grandmother   . Prostate cancer Paternal Grandfather   . Cancer Paternal Grandfather   . Cancer Father     ALLERGIES:  has No Known Allergies.  MEDICATIONS:  Current Outpatient Medications  Medication Sig Dispense Refill  . cholecalciferol (VITAMIN D) 1000 units tablet Take 1 tablet (1,000 Units total) by mouth daily. 90 tablet 0  . phentermine 37.5 MG capsule Take 1 capsule (37.5 mg total) by mouth every morning. (Patient not taking: Reported on 01/31/2018) 30 capsule 0  . spironolactone (ALDACTONE) 50 MG tablet   4   No current facility-administered medications for this visit.      PHYSICAL EXAMINATION: ECOG PERFORMANCE STATUS: 1 - Symptomatic but completely ambulatory Vitals:   02/13/18 0902  BP: 134/86  Pulse: 89  Temp: 98.4 F (36.9 C)   Filed Weights   02/13/18 0902  Weight: 208 lb 2 oz (94.4 kg)    Physical Exam  Constitutional: She is oriented to person, place, and time. She appears well-developed and well-nourished. She appears distressed.  HENT:  Head: Normocephalic and atraumatic.  Right Ear: External ear normal.  Left Ear: External ear normal.  Eyes: Pupils are equal, round, and reactive to light. Conjunctivae and EOM are normal.  Neck: Normal range of motion. Neck supple.  Cardiovascular: Normal rate and regular rhythm. Exam reveals no gallop and no friction rub.  No murmur heard. Pulmonary/Chest: Effort normal and breath sounds normal. She has no wheezes.  Abdominal: Soft. Bowel sounds are normal. There is no tenderness.  Musculoskeletal: Normal range of motion. She exhibits no  edema.  Lymphadenopathy:    She has no cervical adenopathy.  Neurological: She is alert and oriented to person, place, and time. She displays normal reflexes. Coordination normal.  Skin: Skin is warm and dry. No erythema.  Psychiatric: She has a normal mood and affect. Her behavior is normal.     LABORATORY DATA:  I have reviewed the data as listed Lab Results  Component Value Date   WBC 7.3 02/08/2018   HGB 15.6 02/08/2018   HCT 43.4 02/08/2018   MCV 95.0 02/08/2018   PLT 254 02/08/2018   Recent Labs    01/31/18 1515 02/08/18 1550  NA 132* 128*  K 4.0 4.0  CL 99* 96*  CO2 25 24  GLUCOSE 103* 91  BUN 9 11  CREATININE 0.81 0.88  CALCIUM 9.6  9.8 9.6  GFRNONAA >60 >60  GFRAA >60 >60  PROT 8.0 7.5  ALBUMIN 4.7 4.5  AST 184* 141*  ALT 282* 238*  ALKPHOS 57 48  BILITOT 1.4* 2.1*  BILIDIR  --  0.3    Hepatitis B surface antigen negative, hepatitis C RNA nondetectable.  01/13/2108 US abdomen showed upper normal limits spleen size and increased echogenicity of liver questionable fatty liver.  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 Iron overload   3. Hyperbilirubinemia   4. Vitamin D deficiency   5. Transaminitis    # Discussed with patient that she has heterozygous H63D hemochromatosis. Usually heterozygous patient are asymtomatic. However, she does have transaminitis, ferritin >500, will recheck iron panel today. Plan theraputic phelmbotomy 531m x 4.  Refer to GI for evaluation for liver hemochromatosis   # Transaminitis in patient with hemochromatosis, refer to GI.   # Hyperbilirubinemia: mostly indirect, check LDH, haptoglobin, DAT rule out hemolysis. :also can be GRosanna Randysyndrome, can confirm after hemolysis ruled out.  # Hyponatremia: repeat BMP showed sodium has resolved.  # Vitamin D deficiency: started on Vitamin D 1000 units daily recheck vitamin D in 3 months.   All questions were answered. The patient knows to call the clinic  with any problems questions or concerns.  Return of visit: 2 weeks  Dr. KHumphrey Rolls thank you for this kind referral and the opportunity to participate in the care of this patient. A copy of today's note is routed to referring provider    ZEarlie Server MD, PhD Hematology Oncology [facility]ager- [phone]/23/2019

## 2018-02-14 ENCOUNTER — Inpatient Hospital Stay: Payer: BLUE CROSS/BLUE SHIELD | Admitting: Oncology

## 2018-02-14 LAB — HAPTOGLOBIN: HAPTOGLOBIN: 60 mg/dL (ref 34–200)

## 2018-02-20 ENCOUNTER — Inpatient Hospital Stay: Payer: BLUE CROSS/BLUE SHIELD

## 2018-02-20 DIAGNOSIS — R740 Nonspecific elevation of levels of transaminase and lactic acid dehydrogenase [LDH]: Secondary | ICD-10-CM | POA: Diagnosis not present

## 2018-02-20 DIAGNOSIS — E559 Vitamin D deficiency, unspecified: Secondary | ICD-10-CM | POA: Diagnosis not present

## 2018-02-20 DIAGNOSIS — R7989 Other specified abnormal findings of blood chemistry: Secondary | ICD-10-CM | POA: Diagnosis not present

## 2018-02-20 DIAGNOSIS — R17 Unspecified jaundice: Secondary | ICD-10-CM | POA: Diagnosis not present

## 2018-02-20 NOTE — Progress Notes (Signed)
Phlebotomy completed 02/20/18. 500 ml removed. Vital signs stable.

## 2018-02-27 ENCOUNTER — Inpatient Hospital Stay: Payer: BLUE CROSS/BLUE SHIELD | Attending: Oncology

## 2018-02-27 DIAGNOSIS — R17 Unspecified jaundice: Secondary | ICD-10-CM | POA: Insufficient documentation

## 2018-02-27 DIAGNOSIS — E559 Vitamin D deficiency, unspecified: Secondary | ICD-10-CM | POA: Insufficient documentation

## 2018-02-27 DIAGNOSIS — R740 Nonspecific elevation of levels of transaminase and lactic acid dehydrogenase [LDH]: Secondary | ICD-10-CM | POA: Insufficient documentation

## 2018-03-06 ENCOUNTER — Encounter: Payer: Self-pay | Admitting: Internal Medicine

## 2018-03-06 ENCOUNTER — Inpatient Hospital Stay: Payer: BLUE CROSS/BLUE SHIELD

## 2018-03-06 ENCOUNTER — Ambulatory Visit (INDEPENDENT_AMBULATORY_CARE_PROVIDER_SITE_OTHER): Payer: BLUE CROSS/BLUE SHIELD | Admitting: Internal Medicine

## 2018-03-06 VITALS — BP 132/96 | HR 112 | Resp 16 | Ht 63.0 in | Wt 201.6 lb

## 2018-03-06 DIAGNOSIS — R7989 Other specified abnormal findings of blood chemistry: Secondary | ICD-10-CM | POA: Diagnosis not present

## 2018-03-06 DIAGNOSIS — E559 Vitamin D deficiency, unspecified: Secondary | ICD-10-CM | POA: Diagnosis not present

## 2018-03-06 DIAGNOSIS — R740 Nonspecific elevation of levels of transaminase and lactic acid dehydrogenase [LDH]: Secondary | ICD-10-CM | POA: Diagnosis not present

## 2018-03-06 DIAGNOSIS — R17 Unspecified jaundice: Secondary | ICD-10-CM | POA: Diagnosis not present

## 2018-03-06 MED ORDER — PHENTERMINE HCL 37.5 MG PO CAPS: 37.5000 mg | ORAL_CAPSULE | ORAL | 0 refills | Status: DC

## 2018-03-06 NOTE — Progress Notes (Signed)
PIV inserted to right AC with good blood return.  500 mL of blood removed from patient as ordered.  Pt tolerated procedure well. Pt drank a cup of water and ate some crackers prior to leaving the chemo suite.  Pt left infusion room stable and ambulatory with no complaints.

## 2018-03-06 NOTE — Progress Notes (Signed)
[facility] [place], [place] [address]  Internal MEDICINE  Office Visit Note  Patient Name: Jennifer Barron  [id number]  [id number]  Date of Service: 03/06/2018  Chief Complaint  Patient presents with  . Elevated ferritin  . weight management    HPI  Pt is here for routine follow up. Pt has seen Hematology, she is getting phlebotomy. Able to lose weight about 10 lbs since last visit   Current Medication: Outpatient Encounter Medications as of 03/06/2018  Medication Sig  . cholecalciferol (VITAMIN D) 1000 units tablet Take 1 tablet (1,000 Units total) by mouth daily.  . phentermine 37.5 MG capsule Take 1 capsule (37.5 mg total) by mouth every morning.  Marland Kitchen spironolactone (ALDACTONE) 50 MG tablet   . [DISCONTINUED] phentermine 37.5 MG capsule Take 1 capsule (37.5 mg total) by mouth every morning.   No facility-administered encounter medications on file as of 03/06/2018.     Surgical History: Past Surgical History:  Procedure Laterality Date  . abdominal laproscopy    . ADENOIDECTOMY     twice  . TONSILLECTOMY Bilateral 1987  . WISDOM TOOTH EXTRACTION  2009    Medical History: Past Medical History:  Diagnosis Date  . Endometriosis   . Migraine     Family History: Family History  Problem Relation Age of Onset  . Ovarian cancer Paternal Grandmother   . Cancer Paternal Grandmother   . Prostate cancer Paternal Grandfather   . Cancer Paternal Grandfather   . Cancer Father     Social History   Socioeconomic History  . Marital status: Married    Spouse name: Not on file  . Number of children: Not on file  . Years of education: Not on file  . Highest education level: Not on file  Occupational History  . Not on file  Social Needs  . Financial resource strain: Not on file  . Food insecurity:    Worry: Not on file    Inability: Not on file  . Transportation needs:    Medical: Not on file    Non-medical: Not on file  Tobacco Use   . Smoking status: Never Smoker  . Smokeless tobacco: Never Used  Substance and Sexual Activity  . Alcohol use: Yes    Frequency: Never    Comment: social  . Drug use: No  . Sexual activity: Yes  Lifestyle  . Physical activity:    Days per week: Not on file    Minutes per session: Not on file  . Stress: Not on file  Relationships  . Social connections:    Talks on phone: Not on file    Gets together: Not on file    Attends religious service: Not on file    Active member of club or organization: Not on file    Attends meetings of clubs or organizations: Not on file    Relationship status: Not on file  . Intimate partner violence:    Fear of current or ex partner: Not on file    Emotionally abused: Not on file    Physically abused: Not on file    Forced sexual activity: Not on file  Other Topics Concern  . Not on file  Social History Narrative  . Not on file      Review of Systems  Constitutional: Negative for chills, diaphoresis and fatigue.  HENT: Negative for ear pain, postnasal drip and sinus pressure.   Eyes: Negative for photophobia, discharge, redness, itching and visual  disturbance.  Respiratory: Negative for cough, shortness of breath and wheezing.   Cardiovascular: Negative for chest pain, palpitations and leg swelling.  Gastrointestinal: Negative for abdominal pain, constipation, diarrhea, nausea and vomiting.  Genitourinary: Negative for dysuria and flank pain.  Musculoskeletal: Negative for arthralgias, back pain, gait problem and neck pain.  Skin: Negative for color change.  Allergic/Immunologic: Negative for environmental allergies and food allergies.  Neurological: Negative for dizziness and headaches.  Hematological: Does not bruise/bleed easily.  Psychiatric/Behavioral: Negative for agitation, behavioral problems (depression) and hallucinations.   Vital Signs: BP (!) 132/96   Pulse (!) 112   Resp 16   Ht 5' 3"  (1.6 m)   Wt 201 lb 9.6 oz (91.4 kg)    SpO2 99%   BMI 35.71 kg/m   Physical Exam  Constitutional: She is oriented to person, place, and time. She appears well-developed and well-nourished. No distress.  HENT:  Head: Normocephalic and atraumatic.  Mouth/Throat: Oropharynx is clear and moist. No oropharyngeal exudate.  Eyes: Pupils are equal, round, and reactive to light. EOM are normal.  Neck: Normal range of motion. Neck supple. No JVD present. No tracheal deviation present. No thyromegaly present.  Cardiovascular: Normal rate, regular rhythm and normal heart sounds. Exam reveals no gallop and no friction rub.  No murmur heard. Pulmonary/Chest: Effort normal. No respiratory distress. She has no wheezes. She has no rales. She exhibits no tenderness.  Abdominal: Soft. Bowel sounds are normal.  Musculoskeletal: Normal range of motion.  Lymphadenopathy:    She has no cervical adenopathy.  Neurological: She is alert and oriented to person, place, and time. No cranial nerve deficit.  Skin: Skin is warm and dry. She is not diaphoretic.  Psychiatric: She has a normal mood and affect. Her behavior is normal. Judgment and thought content normal.   Assessment/Plan: 1. Elevated ferritin - She is getting phlebotomy for it, will have f/u with Hematology  2. Morbid obesity (HCC) - phentermine 37.5 MG capsule; Take 1 capsule (37.5 mg total) by mouth every morning.  Dispense: 30 capsule; Refill: 0  General Counseling: Yolunda verbalizes understanding of the findings of todays visit and agrees with plan of treatment. I have discussed any further diagnostic evaluation that may be needed or ordered today. We also reviewed her medications today. she has been encouraged to call the office with any questions or concerns that should arise related to todays visit.  Meds ordered this encounter  Medications  . phentermine 37.5 MG capsule    Sig: Take 1 capsule (37.5 mg total) by mouth every morning.    Dispense:  30 capsule    Refill:  0     Time spent: 53 Minutes   Dr Lavera Guise Internal medicine

## 2018-03-09 ENCOUNTER — Inpatient Hospital Stay: Payer: BLUE CROSS/BLUE SHIELD

## 2018-03-09 DIAGNOSIS — R740 Nonspecific elevation of levels of transaminase and lactic acid dehydrogenase [LDH]: Secondary | ICD-10-CM | POA: Diagnosis not present

## 2018-03-09 DIAGNOSIS — E559 Vitamin D deficiency, unspecified: Secondary | ICD-10-CM | POA: Diagnosis not present

## 2018-03-09 DIAGNOSIS — R17 Unspecified jaundice: Secondary | ICD-10-CM | POA: Diagnosis not present

## 2018-03-09 LAB — CBC WITH DIFFERENTIAL/PLATELET
BASOS PCT: 1 %
Basophils Absolute: 0 10*3/uL (ref 0–0.1)
Eosinophils Absolute: 0.2 10*3/uL (ref 0–0.7)
Eosinophils Relative: 3 %
HEMATOCRIT: 37.7 % (ref 35.0–47.0)
HEMOGLOBIN: 13.9 g/dL (ref 12.0–16.0)
LYMPHS PCT: 26 %
Lymphs Abs: 1.7 10*3/uL (ref 1.0–3.6)
MCH: 35.1 pg — ABNORMAL HIGH (ref 26.0–34.0)
MCHC: 36.8 g/dL — ABNORMAL HIGH (ref 32.0–36.0)
MCV: 95.4 fL (ref 80.0–100.0)
Monocytes Absolute: 0.6 10*3/uL (ref 0.2–0.9)
Monocytes Relative: 9 %
NEUTROS ABS: 3.9 10*3/uL (ref 1.4–6.5)
NEUTROS PCT: 61 %
Platelets: 319 10*3/uL (ref 150–440)
RBC: 3.96 MIL/uL (ref 3.80–5.20)
RDW: 12.5 % (ref 11.5–14.5)
WBC: 6.4 10*3/uL (ref 3.6–11.0)

## 2018-03-09 LAB — IRON AND TIBC
Iron: 66 ug/dL (ref 28–170)
Saturation Ratios: 15 % (ref 10.4–31.8)
TIBC: 452 ug/dL — ABNORMAL HIGH (ref 250–450)
UIBC: 387 ug/dL

## 2018-03-09 LAB — FERRITIN: Ferritin: 168 ng/mL (ref 11–307)

## 2018-03-12 ENCOUNTER — Other Ambulatory Visit: Payer: BLUE CROSS/BLUE SHIELD

## 2018-03-12 ENCOUNTER — Encounter: Payer: Self-pay | Admitting: Oncology

## 2018-03-12 ENCOUNTER — Inpatient Hospital Stay (HOSPITAL_BASED_OUTPATIENT_CLINIC_OR_DEPARTMENT_OTHER): Payer: BLUE CROSS/BLUE SHIELD | Admitting: Oncology

## 2018-03-12 ENCOUNTER — Inpatient Hospital Stay: Payer: BLUE CROSS/BLUE SHIELD

## 2018-03-12 DIAGNOSIS — R17 Unspecified jaundice: Secondary | ICD-10-CM | POA: Diagnosis not present

## 2018-03-12 DIAGNOSIS — R740 Nonspecific elevation of levels of transaminase and lactic acid dehydrogenase [LDH]: Secondary | ICD-10-CM | POA: Diagnosis not present

## 2018-03-12 DIAGNOSIS — E559 Vitamin D deficiency, unspecified: Secondary | ICD-10-CM | POA: Diagnosis not present

## 2018-03-12 NOTE — Progress Notes (Signed)
Hematology/Oncology Follow up note [facility] Telephone:(336) [phone] Fax:(336) [phone]   Patient Care Team: Lavera Guise, MD as PCP - General (Internal Medicine)  REFERRING PROVIDER: Lavera Guise, MD REASON FOR VISIT Follow up for treatment of iron overload and hemochromatosis.    HISTORY OF PRESENTING ILLNESS:  Jennifer Barron is a  37 y.o.  female with PMH listed below who was referred to me for evaluation of elevated ferritin. Patient follows up with GYN for IUD placement, and was fine to have abnormal liver function test, mainly elevated transaminitis.  Patient went to primary care physician Dr. Humphrey Rolls And had additional testing.  She was found to have elevated ferritin level at 547, iron saturation 33, ultrasound of abdomen was done on January 12, 2018, which showed upper normal limits spleen size and increased echogenicity of liver questionable fatty liver. Patient was referred to [facility] for further evaluation of elevated ferritin. Currently patient takes spironolactone for acne and this was started by dermatologist a week ago.  She also takes phentermine for obesity, recently started. Patient reports feeling tired fatigue.  Denies any joint pain shortness of breath, lower extremity swelling.  She denies any family history of hemochromatosis.  INTERVAL HISTORY Jennifer Barron is a 37 y.o. female who has above history reviewed by me today presents for follow up visit for management of iron overload and hemachromatosis.  She has had therapeutic phlebotomy done during the interval and return to repeat iron panel. She continues to have chronic fatigue, otherwise feeling well.   Problems and complaints are listed below: Review of Systems  Constitutional: Positive for malaise/fatigue. Negative for chills, diaphoresis, fever and weight loss.  HENT: Negative for congestion, ear discharge, ear pain, hearing loss, nosebleeds, sinus pain and sore throat.     Eyes: Negative for double vision, photophobia, pain, discharge and redness.  Respiratory: Negative for cough, hemoptysis, sputum production, shortness of breath and wheezing.   Cardiovascular: Negative for chest pain, palpitations, orthopnea, claudication and leg swelling.  Gastrointestinal: Negative for abdominal pain, blood in stool, constipation, diarrhea, heartburn, melena, nausea and vomiting.  Genitourinary: Negative for dysuria, flank pain, frequency, hematuria and urgency.  Musculoskeletal: Negative for back pain, myalgias and neck pain.  Skin: Negative for itching and rash.  Neurological: Negative for dizziness, tingling, tremors, sensory change, focal weakness, weakness and headaches.  Endo/Heme/Allergies: Negative for environmental allergies. Does not bruise/bleed easily.  Psychiatric/Behavioral: Negative for depression, hallucinations and substance abuse. The patient is not nervous/anxious.     MEDICAL HISTORY:  Past Medical History:  Diagnosis Date  . Endometriosis   . Migraine     SURGICAL HISTORY: Past Surgical History:  Procedure Laterality Date  . abdominal laproscopy    . ADENOIDECTOMY     twice  . TONSILLECTOMY Bilateral 1987  . WISDOM TOOTH EXTRACTION  2009    SOCIAL HISTORY: Social History   Socioeconomic History  . Marital status: Married    Spouse name: Not on file  . Number of children: Not on file  . Years of education: Not on file  . Highest education level: Not on file  Occupational History  . Not on file  Social Needs  . Financial resource strain: Not on file  . Food insecurity:    Worry: Not on file    Inability: Not on file  . Transportation needs:    Medical: Not on file    Non-medical: Not on file  Tobacco Use  . Smoking status: Never Smoker  .  Smokeless tobacco: Never Used  Substance and Sexual Activity  . Alcohol use: Yes    Frequency: Never    Comment: social  . Drug use: No  . Sexual activity: Yes  Lifestyle  . Physical  activity:    Days per week: Not on file    Minutes per session: Not on file  . Stress: Not on file  Relationships  . Social connections:    Talks on phone: Not on file    Gets together: Not on file    Attends religious service: Not on file    Active member of club or organization: Not on file    Attends meetings of clubs or organizations: Not on file    Relationship status: Not on file  . Intimate partner violence:    Fear of current or ex partner: Not on file    Emotionally abused: Not on file    Physically abused: Not on file    Forced sexual activity: Not on file  Other Topics Concern  . Not on file  Social History Narrative  . Not on file    FAMILY HISTORY: Family History  Problem Relation Age of Onset  . Ovarian cancer Paternal Grandmother   . Cancer Paternal Grandmother   . Prostate cancer Paternal Grandfather   . Cancer Paternal Grandfather   . Cancer Father     ALLERGIES:  has No Known Allergies.  MEDICATIONS:  Current Outpatient Medications  Medication Sig Dispense Refill  . phentermine 37.5 MG capsule Take 1 capsule (37.5 mg total) by mouth every morning. 30 capsule 0  . spironolactone (ALDACTONE) 50 MG tablet   4  . cholecalciferol (VITAMIN D) 1000 units tablet Take 1 tablet (1,000 Units total) by mouth daily. (Patient not taking: Reported on 03/12/2018) 90 tablet 0   No current facility-administered medications for this visit.      PHYSICAL EXAMINATION: ECOG PERFORMANCE STATUS: 1 - Symptomatic but completely ambulatory Vitals:   03/12/18 1026  BP: (!) 135/97  Pulse: 100  Resp: 16  Temp: 98.5 F (36.9 C)   Filed Weights   03/12/18 1026  Weight: 197 lb 9 oz (89.6 kg)    Physical Exam  Constitutional: She is oriented to person, place, and time. She appears well-developed and well-nourished. No distress.  HENT:  Head: Normocephalic and atraumatic.  Right Ear: External ear normal.  Left Ear: External ear normal.  Mouth/Throat: Oropharynx is  clear and moist.  Eyes: Pupils are equal, round, and reactive to light. Conjunctivae and EOM are normal. No scleral icterus.  Neck: Normal range of motion. Neck supple.  Cardiovascular: Normal rate, regular rhythm and normal heart sounds. Exam reveals no gallop and no friction rub.  No murmur heard. Pulmonary/Chest: Effort normal and breath sounds normal. No respiratory distress. She has no wheezes. She has no rales. She exhibits no tenderness.  Abdominal: Soft. Bowel sounds are normal. She exhibits no distension and no mass. There is no tenderness.  Musculoskeletal: Normal range of motion. She exhibits no edema or deformity.  Lymphadenopathy:    She has no cervical adenopathy.  Neurological: She is alert and oriented to person, place, and time. She displays normal reflexes. No cranial nerve deficit. Coordination normal.  Skin: Skin is warm and dry. No rash noted. No erythema.  Psychiatric: She has a normal mood and affect. Her behavior is normal. Thought content normal.     LABORATORY DATA:  I have reviewed the data as listed Lab Results  Component Value Date  WBC 6.4 03/09/2018   HGB 13.9 03/09/2018   HCT 37.7 03/09/2018   MCV 95.4 03/09/2018   PLT 319 03/09/2018   Recent Labs    01/31/18 1515 02/08/18 1550 02/13/18 0940  NA 132* 128* 136  K 4.0 4.0 4.4  CL 99* 96* 101  CO2 25 24 26   GLUCOSE 103* 91 110*  BUN 9 11 10   CREATININE 0.81 0.88 0.77  CALCIUM 9.6  9.8 9.6 10.0  GFRNONAA >60 >60 >60  GFRAA >60 >60 >60  PROT 8.0 7.5  --   ALBUMIN 4.7 4.5  --   AST 184* 141*  --   ALT 282* 238*  --   ALKPHOS 57 48  --   BILITOT 1.4* 2.1*  --   BILIDIR  --  0.3  --     Hepatitis B surface antigen negative, hepatitis C RNA nondetectable.  01/13/2108 US abdomen showed upper normal limits spleen size and increased echogenicity of liver questionable fatty liver.  ASSESSMENT & PLAN:  1. Iron overload   2. Hereditary hemochromatosis ([place])   3. Hyperbilirubinemia   4.  Vitamin D deficiency    #Heterozygous H63D hemochromatosis. ferritin decreased to 168 after 4 phlebotomy.  She has been referred to GI for evaluation for liver hemochromatosis/ transaminitis.  Iron overload, improving, will proceed with one more phlebotomy, hopefully reach goal of ferritin <100.   # Transaminitis in patient with hemochromatosis, refer to GI.   # Hyperbilirubinemia: nommal haptoglobin, DAT,  ? Gilbert syndrome,    # Vitamin D deficiency: started on Vitamin D 1000 units daily recheck vitamin D in 3 months.   All questions were answered. The patient knows to call the clinic with any problems questions or concerns.  Return of visit: 3 months with repeat cbc, iron ferritin, vitamin D, labs to be done prior to visit.  Dr. Humphrey Rolls, thank you for this kind referral and the opportunity to participate in the care of this patient. A copy of today's note is routed to referring provider    Earlie Server, MD, PhD Hematology Oncology [facility] Pager- [phone] 03/12/2018

## 2018-03-12 NOTE — Progress Notes (Signed)
Pt in for follow up, had labs done on Friday 17th. Pt feeling better "not as tired", "nervous today about results".  Reports only taking phentermine twice a week.

## 2018-03-13 ENCOUNTER — Encounter: Payer: Self-pay | Admitting: Oncology

## 2018-03-15 ENCOUNTER — Other Ambulatory Visit: Payer: Self-pay | Admitting: Internal Medicine

## 2018-03-15 NOTE — Telephone Encounter (Signed)
SPOKE WITH PT DR Humphrey Rolls GAVE HER PRES  ON HAND

## 2018-03-27 ENCOUNTER — Other Ambulatory Visit
Admission: RE | Admit: 2018-03-27 | Discharge: 2018-03-27 | Disposition: A | Discharge status: Home / Self Care | Payer: BLUE CROSS/BLUE SHIELD | Source: Ambulatory Visit | Attending: Gastroenterology | Admitting: Gastroenterology

## 2018-03-27 ENCOUNTER — Ambulatory Visit (INDEPENDENT_AMBULATORY_CARE_PROVIDER_SITE_OTHER): Payer: BLUE CROSS/BLUE SHIELD | Admitting: Gastroenterology

## 2018-03-27 ENCOUNTER — Encounter: Payer: Self-pay | Admitting: Gastroenterology

## 2018-03-27 VITALS — BP 133/90 | HR 105 | Resp 18 | Ht 63.0 in | Wt 196.4 lb

## 2018-03-27 DIAGNOSIS — G43909 Migraine, unspecified, not intractable, without status migrainosus: Secondary | ICD-10-CM | POA: Diagnosis not present

## 2018-03-27 DIAGNOSIS — R945 Abnormal results of liver function studies: Secondary | ICD-10-CM

## 2018-03-27 DIAGNOSIS — Z6834 Body mass index (BMI) 34.0-34.9, adult: Secondary | ICD-10-CM | POA: Diagnosis not present

## 2018-03-27 DIAGNOSIS — R7989 Other specified abnormal findings of blood chemistry: Secondary | ICD-10-CM

## 2018-03-27 DIAGNOSIS — E669 Obesity, unspecified: Secondary | ICD-10-CM | POA: Diagnosis not present

## 2018-03-27 DIAGNOSIS — K760 Fatty (change of) liver, not elsewhere classified: Secondary | ICD-10-CM | POA: Diagnosis not present

## 2018-03-27 LAB — HEPATIC FUNCTION PANEL
ALK PHOS: 46 U/L (ref 38–126)
ALT: 179 U/L — ABNORMAL HIGH (ref 14–54)
AST: 139 U/L — AB (ref 15–41)
Albumin: 4.6 g/dL (ref 3.5–5.0)
BILIRUBIN DIRECT: 0.2 mg/dL (ref 0.1–0.5)
BILIRUBIN TOTAL: 1.5 mg/dL — AB (ref 0.3–1.2)
Indirect Bilirubin: 1.3 mg/dL — ABNORMAL HIGH (ref 0.3–0.9)
Total Protein: 7.7 g/dL (ref 6.5–8.1)

## 2018-03-27 NOTE — Progress Notes (Signed)
Cephas Darby, MD [address]  [place]  [place], [place] [address]  Main: [phone]  Fax: [phone]    Gastroenterology Consultation  Referring Provider:     Lavera Guise, MD Primary Care Physician:  Lavera Guise, MD Primary Gastroenterologist:  Dr. Cephas Darby Reason for Consultation:    Elevated LFTs, iron overload        HPI:   Jennifer Barron is a 37 y.o. female referred by Dr. Humphrey Rolls, Timoteo Gaul, MD  for consultation & management of elevated LFTs, iron overload. She was found to have elevated transaminases, to 4 times upper limit of normal in 11/2017 as part of routine physical by her OB/GYN. Later she had 2 sets of LFTs that were elevated on 2 separate occasions. She had an ultrasound abdomen on 01/12/2018 which revealed hepatic steatosis only and biliary sludge. Further workup revealed elevated ferritin 547, hemoglobin 15.8, heterozygous for H63D mutation. Hepatitis B surface antigen negative, HCV RNA negative. She was seen by hematology and underwent serial phlebotomies, which resulted in improvement of her ferritin as well as hemoglobin. She is referred here for further management. She reports that she lost about 20 pounds by following healthy diet and also on phentermine as needed for weight loss. She denies any other complaints today. She just returned from a 10 day trip to [place]. She reports intermittent episodes of epigastric pain which are mild. She denies any other GI symptoms.  NSAIDs: none  Antiplts/Anticoagulants/Anti thrombotics: None  GI Procedures:  none  Past Medical History:  Diagnosis Date  . Endometriosis   . Migraine     Past Surgical History:  Procedure Laterality Date  . abdominal laproscopy    . ADENOIDECTOMY     twice  . TONSILLECTOMY Bilateral 1987  . WISDOM TOOTH EXTRACTION  2009    Current Outpatient Medications:  .  cholecalciferol (VITAMIN D) 1000 units tablet, Take 1 tablet (1,000 Units total) by mouth daily.,  Disp: 90 tablet, Rfl: 0 .  phentermine 37.5 MG capsule, Take 1 capsule (37.5 mg total) by mouth every morning., Disp: 30 capsule, Rfl: 0 .  spironolactone (ALDACTONE) 50 MG tablet, , Disp: , Rfl: 4    Family History  Problem Relation Age of Onset  . Ovarian cancer Paternal Grandmother   . Cancer Paternal Grandmother   . Prostate cancer Paternal Grandfather   . Cancer Paternal Grandfather   . Cancer Father      Social History   Tobacco Use  . Smoking status: Never Smoker  . Smokeless tobacco: Never Used  Substance Use Topics  . Alcohol use: Yes    Frequency: Never    Comment: social  . Drug use: No    Allergies as of 03/27/2018  . (No Known Allergies)    Review of Systems:    All systems reviewed and negative except where noted in HPI.   Physical Exam:  BP 133/90 (BP Location: Right Arm, Patient Position: Sitting, Cuff Size: Normal)   Pulse (!) 105   Resp 18   Ht 5' 3"  (1.6 m)   Wt 196 lb 6.4 oz (89.1 kg)   BMI 34.79 kg/m  No LMP recorded. Patient is premenarcheal.  General:   Alert,  Well-developed, well-nourished, pleasant and cooperative in NAD Head:  Normocephalic and atraumatic. Eyes:  Sclera clear, no icterus.   Conjunctiva pink. Ears:  Normal auditory acuity. Nose:  No deformity, discharge, or lesions. Mouth:  No deformity or lesions,oropharynx pink & moist. Neck:  Supple; no masses or thyromegaly. Lungs:  Respirations even and unlabored.  Clear throughout to auscultation.   No wheezes, crackles, or rhonchi. No acute distress. Heart:  Regular rate and rhythm; no murmurs, clicks, rubs, or gallops. Abdomen:  Normal bowel sounds. Soft, non-tender and non-distended without masses, hepatosplenomegaly or hernias noted.  No guarding or rebound tenderness.   Rectal: Not performed Msk:  Symmetrical without gross deformities. Good, equal movement & strength bilaterally. Pulses:  Normal pulses noted. Extremities:  No clubbing or edema.  No cyanosis. Neurologic:   Alert and oriented x3;  grossly normal neurologically. Skin:  Intact without significant lesions or rashes. No jaundice. Lymph Nodes:  No significant cervical adenopathy. Psych:  Alert and cooperative. Normal mood and affect.  Imaging Studies: reviewed  Assessment and Plan:   Doranne Schmutz Goebel is a 38 y.o. 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

## 2018-03-28 LAB — ANA W/REFLEX IF POSITIVE: Anti Nuclear Antibody(ANA): NEGATIVE

## 2018-03-28 LAB — TISSUE TRANSGLUTAMINASE, IGA

## 2018-03-28 LAB — HIV ANTIBODY (ROUTINE TESTING W REFLEX): HIV SCREEN 4TH GENERATION: NONREACTIVE

## 2018-03-28 LAB — ANTI-MICROSOMAL ANTIBODY LIVER / KIDNEY: LKM1 Ab: 1.1 Units (ref 0.0–20.0)

## 2018-03-29 LAB — MISC LABCORP TEST (SEND OUT): Labcorp test code: 550140

## 2018-03-29 LAB — MITOCHONDRIAL ANTIBODIES: Mitochondrial M2 Ab, IgG: <20 Units (ref 0.0–20.0)

## 2018-03-29 LAB — BETA-2-GLYCOPROTEIN I ABS, IGG/M/A
Beta-2 Glyco I IgG: <9 GPI IgG units (ref 0–20)
Beta-2-Glycoprotein I IgA: <9 GPI IgA units (ref 0–25)
Beta-2-Glycoprotein I IgM: <9 GPI IgM units (ref 0–32)

## 2018-03-29 LAB — ANTI-SMOOTH MUSCLE ANTIBODY, IGG: F-Actin IgG: 6 Units (ref 0–19)

## 2018-04-10 ENCOUNTER — Ambulatory Visit: Payer: BLUE CROSS/BLUE SHIELD | Admitting: Gastroenterology

## 2018-05-04 DIAGNOSIS — L658 Other specified nonscarring hair loss: Secondary | ICD-10-CM | POA: Diagnosis not present

## 2018-05-04 DIAGNOSIS — L700 Acne vulgaris: Secondary | ICD-10-CM | POA: Diagnosis not present

## 2018-05-09 ENCOUNTER — Ambulatory Visit (INDEPENDENT_AMBULATORY_CARE_PROVIDER_SITE_OTHER): Payer: Self-pay | Admitting: Adult Health

## 2018-05-09 ENCOUNTER — Encounter: Payer: Self-pay | Admitting: Adult Health

## 2018-05-09 VITALS — BP 136/92 | HR 72 | Resp 16 | Ht 63.0 in | Wt 188.6 lb

## 2018-05-09 DIAGNOSIS — K5900 Constipation, unspecified: Secondary | ICD-10-CM

## 2018-05-09 DIAGNOSIS — K760 Fatty (change of) liver, not elsewhere classified: Secondary | ICD-10-CM | POA: Diagnosis not present

## 2018-05-09 DIAGNOSIS — R7989 Other specified abnormal findings of blood chemistry: Secondary | ICD-10-CM

## 2018-05-09 MED ORDER — PHENTERMINE HCL 37.5 MG PO CAPS: 37.5000 mg | ORAL_CAPSULE | ORAL | 0 refills | Status: DC

## 2018-05-09 MED ORDER — DOCUSATE SODIUM 100 MG PO CAPS: 100.0000 mg | ORAL_CAPSULE | Freq: Every day | ORAL | 0 refills | Status: DC

## 2018-05-09 NOTE — Progress Notes (Signed)
[facility] [place], [place] [address]  Internal MEDICINE  Office Visit Note  Patient Name: Jennifer Barron  [id number]  [id number]  Date of Service: 05/14/2018  Chief Complaint  Patient presents with  . Weight Loss    HPI Pt has multiple medical problems  Pt here for weight loss follow up.  She reports she has been taking her Phentermine. She has been trying to walk and ride her stationary bike.  She is watching her intake and states she is doing well.  She is down 25 pounds since starting the appetite suppressant. She denies palpitations, chest pain or other side effects of medication.       Current Medication: Outpatient Encounter Medications as of 05/09/2018  Medication Sig  . cholecalciferol (VITAMIN D) 1000 units tablet Take 1 tablet (1,000 Units total) by mouth daily.  Marland Kitchen docusate sodium (COLACE) 100 MG capsule Take 1 capsule (100 mg total) by mouth daily.  . phentermine 37.5 MG capsule Take 1 capsule (37.5 mg total) by mouth every morning.  Marland Kitchen spironolactone (ALDACTONE) 50 MG tablet   . [DISCONTINUED] phentermine 37.5 MG capsule Take 1 capsule (37.5 mg total) by mouth every morning.   No facility-administered encounter medications on file as of 05/09/2018.     Surgical History: Past Surgical History:  Procedure Laterality Date  . abdominal laproscopy    . ADENOIDECTOMY     twice  . TONSILLECTOMY Bilateral 1987  . WISDOM TOOTH EXTRACTION  2009    Medical History: Past Medical History:  Diagnosis Date  . Endometriosis   . Migraine     Family History: Family History  Problem Relation Age of Onset  . Ovarian cancer Paternal Grandmother   . Cancer Paternal Grandmother   . Prostate cancer Paternal Grandfather   . Cancer Paternal Grandfather   . Cancer Father     Social History   Socioeconomic History  . Marital status: Married    Spouse name: Not on file  . Number of children: Not on file  . Years of education: Not on file   . Highest education level: Not on file  Occupational History  . Not on file  Social Needs  . Financial resource strain: Not on file  . Food insecurity:    Worry: Not on file    Inability: Not on file  . Transportation needs:    Medical: Not on file    Non-medical: Not on file  Tobacco Use  . Smoking status: Never Smoker  . Smokeless tobacco: Never Used  Substance and Sexual Activity  . Alcohol use: Yes    Frequency: Never    Comment: social  . Drug use: No  . Sexual activity: Yes  Lifestyle  . Physical activity:    Days per week: Not on file    Minutes per session: Not on file  . Stress: Not on file  Relationships  . Social connections:    Talks on phone: Not on file    Gets together: Not on file    Attends religious service: Not on file    Active member of club or organization: Not on file    Attends meetings of clubs or organizations: Not on file    Relationship status: Not on file  . Intimate partner violence:    Fear of current or ex partner: Not on file    Emotionally abused: Not on file    Physically abused: Not on file    Forced sexual activity: Not  on file  Other Topics Concern  . Not on file  Social History Narrative  . Not on file      Review of Systems  Constitutional: Negative for chills, fatigue and unexpected weight change.  HENT: Negative for congestion, rhinorrhea, sneezing and sore throat.   Eyes: Negative for photophobia, pain and redness.  Respiratory: Negative for cough, chest tightness and shortness of breath.   Cardiovascular: Negative for chest pain and palpitations.  Gastrointestinal: Positive for constipation. Negative for abdominal pain, diarrhea, nausea and vomiting.  Endocrine: Negative.   Genitourinary: Negative for dysuria and frequency.  Musculoskeletal: Negative for arthralgias, back pain, joint swelling and neck pain.  Skin: Negative for rash.  Allergic/Immunologic: Negative.   Neurological: Negative for tremors and  numbness.  Hematological: Negative for adenopathy. Does not bruise/bleed easily.  Psychiatric/Behavioral: Negative for behavioral problems and sleep disturbance. The patient is not nervous/anxious.     Vital Signs: BP (!) 136/92   Pulse 72   Resp 16   Ht 5' 3"  (1.6 m)   Wt 188 lb 9.6 oz (85.5 kg)   SpO2 95%   BMI 33.41 kg/m    Physical Exam  Constitutional: She is oriented to person, place, and time. She appears well-developed and well-nourished. No distress.  HENT:  Head: Normocephalic and atraumatic.  Mouth/Throat: Oropharynx is clear and moist. No oropharyngeal exudate.  Eyes: Pupils are equal, round, and reactive to light. EOM are normal.  Neck: Normal range of motion. Neck supple. No JVD present. No tracheal deviation present. No thyromegaly present.  Cardiovascular: Normal rate, regular rhythm and normal heart sounds. Exam reveals no gallop and no friction rub.  No murmur heard. Pulmonary/Chest: Effort normal and breath sounds normal. No respiratory distress. She has no wheezes. She has no rales. She exhibits no tenderness.  Abdominal: Soft. There is no tenderness. There is no guarding.  Musculoskeletal: Normal range of motion.  Lymphadenopathy:    She has no cervical adenopathy.  Neurological: She is alert and oriented to person, place, and time. No cranial nerve deficit.  Skin: Skin is warm and dry. She is not diaphoretic.  Psychiatric: She has a normal mood and affect. Her behavior is normal. Judgment and thought content normal.  Nursing note and vitals reviewed.   Assessment/Plan:  1. Elevated ferritin Continue to follow up with Hematology for phlebotomy.   2. Morbid obesity ([place]) Pt continues to lose weight with appetite suppressant. She is starting back her exercise routine since returning from vacation.  She is intermittently taking her phentermine in addition to this.  - phentermine 37.5 MG capsule; Take 1 capsule (37.5 mg total) by mouth every morning.   Dispense: 30 capsule; Refill: 0  3. Fatty liver disease, nonalcoholic   Continue to monitor.   4. Constipation, unspecified constipation type Encouraged water intake.  Educated on fiber and roughage in diet.  - docusate sodium (COLACE) 100 MG capsule; Take 1 capsule (100 mg total) by mouth daily.  Dispense: 30 capsule; Refill: 0   General Counseling: Indiyah verbalizes understanding of the findings of todays visit and agrees with plan of treatment. I have discussed any further diagnostic evaluation that may be needed or ordered today. We also reviewed her medications today. she has been encouraged to call the office with any questions or concerns that should arise related to todays visit.   Meds ordered this encounter  Medications  . docusate sodium (COLACE) 100 MG capsule    Sig: Take 1 capsule (100 mg total) by mouth  daily.    Dispense:  30 capsule    Refill:  0  . phentermine 37.5 MG capsule    Sig: Take 1 capsule (37.5 mg total) by mouth every morning.    Dispense:  30 capsule    Refill:  0    Please use tablets instead of capsules.    Time spent: 25 Minutes   This patient was seen by Orson Gear AGNP-C in Collaboration with Dr Lavera Guise as a part of collaborative care agreement    Dr Lavera Guise Internal medicine

## 2018-05-29 ENCOUNTER — Ambulatory Visit (INDEPENDENT_AMBULATORY_CARE_PROVIDER_SITE_OTHER): Payer: BLUE CROSS/BLUE SHIELD | Admitting: Gastroenterology

## 2018-05-29 ENCOUNTER — Encounter: Payer: Self-pay | Admitting: Gastroenterology

## 2018-05-29 VITALS — BP 136/90 | HR 83 | Resp 17 | Ht 63.0 in | Wt 183.8 lb

## 2018-05-29 DIAGNOSIS — K7581 Nonalcoholic steatohepatitis (NASH): Secondary | ICD-10-CM

## 2018-05-29 NOTE — Progress Notes (Signed)
Jennifer Darby, MD [address]  [place]  [place], [place] [address]  Main: [phone]  Fax: [phone]    Gastroenterology Consultation  Referring Provider:     Lavera Guise, MD Primary Care Physician:  Lavera Guise, MD Primary Gastroenterologist:  Dr. Cephas Barron Reason for Consultation:    Elevated LFTs, iron overload        HPI:   Jennifer Barron is a 37 y.o. female referred by Dr. Humphrey Rolls, Timoteo Gaul, MD  for consultation & management of elevated LFTs, iron overload. She was found to have elevated transaminases, to 4 times upper limit of normal in 11/2017 as part of routine physical by her OB/GYN. Later she had 2 sets of LFTs that were elevated on 2 separate occasions. She had an ultrasound abdomen on 01/12/2018 which revealed hepatic steatosis only and biliary sludge. Further workup revealed elevated ferritin 547, hemoglobin 15.8, heterozygous for H63D mutation. Hepatitis B surface antigen negative, HCV RNA negative. She was seen by hematology and underwent serial phlebotomies, which resulted in improvement of her ferritin as well as hemoglobin. She is referred here for further management. She reports that she lost about 20 pounds by following healthy diet and also on phentermine as needed for weight loss. She denies any other complaints today. She just returned from a 10 day trip to [place]. She reports intermittent episodes of epigastric pain which are mild. She denies any other GI symptoms.  Follow up visit 05/29/2018 Secondary liver disease workup came back unremarkable. There is no evidence of fibrosis on Nash fibrosis score. Her transaminases have slightly improved. She is scheduled for phlebotomy end of this month with hematology. She reports that her fatigue has returned and thinks that she probably needs phlebotomy sooner than her scheduled date. She lost about 10 pounds since last visit following healthy diet and exercise. She takes phentermine about 2-3 times  per week. She does not have any other GI complaints  NSAIDs: none  Antiplts/Anticoagulants/Anti thrombotics: None  GI Procedures:  none  Past Medical History:  Diagnosis Date  . Endometriosis   . Migraine     Past Surgical History:  Procedure Laterality Date  . abdominal laproscopy    . ADENOIDECTOMY     twice  . TONSILLECTOMY Bilateral 1987  . WISDOM TOOTH EXTRACTION  2009    Current Outpatient Medications:  .  cholecalciferol (VITAMIN D) 1000 units tablet, Take 1 tablet (1,000 Units total) by mouth daily., Disp: 90 tablet, Rfl: 0 .  docusate sodium (COLACE) 100 MG capsule, Take 1 capsule (100 mg total) by mouth daily., Disp: 30 capsule, Rfl: 0 .  phentermine 37.5 MG capsule, Take 1 capsule (37.5 mg total) by mouth every morning., Disp: 30 capsule, Rfl: 0 .  spironolactone (ALDACTONE) 50 MG tablet, , Disp: , Rfl: 4    Family History  Problem Relation Age of Onset  . Ovarian cancer Paternal Grandmother   . Cancer Paternal Grandmother   . Prostate cancer Paternal Grandfather   . Cancer Paternal Grandfather   . Cancer Father      Social History   Tobacco Use  . Smoking status: Never Smoker  . Smokeless tobacco: Never Used  Substance Use Topics  . Alcohol use: Yes    Frequency: Never    Comment: social  . Drug use: No    Allergies as of 05/29/2018  . (No Known Allergies)    Review of Systems:    All systems reviewed and negative except  where noted in HPI.   Physical Exam:  BP 136/90 (BP Location: Left Arm, Patient Position: Sitting, Cuff Size: Large)   Pulse 83   Resp 17   Ht 5' 3"  (1.6 m)   Wt 183 lb 12.8 oz (83.4 kg)   BMI 32.56 kg/m  No LMP recorded. Patient is premenarcheal.  General:   Alert,  Well-developed, well-nourished, pleasant and cooperative in NAD Head:  Normocephalic and atraumatic. Eyes:  Sclera clear, no icterus.   Conjunctiva pink. Ears:  Normal auditory acuity. Nose:  No deformity, discharge, or lesions. Mouth:  No deformity  or lesions,oropharynx pink & moist. Neck:  Supple; no masses or thyromegaly. Lungs:  Respirations even and unlabored.  Clear throughout to auscultation.   No wheezes, crackles, or rhonchi. No acute distress. Heart:  Regular rate and rhythm; no murmurs, clicks, rubs, or gallops. Abdomen:  Normal bowel sounds. Soft, non-tender and non-distended without masses, hepatosplenomegaly or hernias noted.  No guarding or rebound tenderness.   Rectal: Not performed Msk:  Symmetrical without gross deformities. Good, equal movement & strength bilaterally. Pulses:  Normal pulses noted. Extremities:  No clubbing or edema.  No cyanosis. Neurologic:  Alert and oriented x3;  grossly normal neurologically. Skin:  Intact without significant lesions or rashes. No jaundice. Lymph Nodes:  No significant cervical adenopathy. Psych:  Alert and cooperative. Normal mood and affect.  Imaging Studies: reviewed  Assessment and Plan:   Jennifer Barron is a 37 y.o. 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 Tasia Catchings to schedule earlier appointment for phlebotomy   Follow up in 3-4 months   Jennifer Darby, MD

## 2018-06-05 NOTE — Progress Notes (Signed)
Please arrange patient to get cbc, iron ferritin, CMP, vitamin D level done this week and based on the labs, will decide if she needs phlebotomy earlier or not. Thanks.

## 2018-06-06 ENCOUNTER — Other Ambulatory Visit: Payer: Self-pay | Admitting: Oncology

## 2018-06-06 ENCOUNTER — Telehealth: Payer: Self-pay | Admitting: Oncology

## 2018-06-06 NOTE — Telephone Encounter (Signed)
Spoke to patient to let her know that Dr. Tasia Catchings want her to get labs this week and she should be expecting call from schedulers.

## 2018-06-06 NOTE — Progress Notes (Signed)
Patient notified and message sent to schedulers.

## 2018-06-07 ENCOUNTER — Inpatient Hospital Stay: Payer: BLUE CROSS/BLUE SHIELD | Attending: Oncology

## 2018-06-07 ENCOUNTER — Encounter: Payer: Self-pay | Admitting: Gastroenterology

## 2018-06-07 ENCOUNTER — Other Ambulatory Visit: Payer: Self-pay

## 2018-06-07 DIAGNOSIS — E559 Vitamin D deficiency, unspecified: Secondary | ICD-10-CM | POA: Insufficient documentation

## 2018-06-07 DIAGNOSIS — R5383 Other fatigue: Secondary | ICD-10-CM | POA: Insufficient documentation

## 2018-06-07 DIAGNOSIS — R740 Nonspecific elevation of levels of transaminase and lactic acid dehydrogenase [LDH]: Secondary | ICD-10-CM | POA: Diagnosis not present

## 2018-06-07 LAB — COMPREHENSIVE METABOLIC PANEL
ALBUMIN: 4.4 g/dL (ref 3.5–5.0)
ALT: 153 U/L — AB (ref 0–44)
AST: 113 U/L — AB (ref 15–41)
Alkaline Phosphatase: 52 U/L (ref 38–126)
Anion gap: 11 (ref 5–15)
BUN: 11 mg/dL (ref 6–20)
CHLORIDE: 103 mmol/L (ref 98–111)
CO2: 24 mmol/L (ref 22–32)
Calcium: 9.6 mg/dL (ref 8.9–10.3)
Creatinine, Ser: 0.86 mg/dL (ref 0.44–1.00)
GFR calc Af Amer: >60 mL/min (ref >60)
GFR calc non Af Amer: >60 mL/min (ref >60)
GLUCOSE: 104 mg/dL — AB (ref 70–99)
Potassium: 4.1 mmol/L (ref 3.5–5.1)
Sodium: 138 mmol/L (ref 135–145)
Total Bilirubin: 0.9 mg/dL (ref 0.3–1.2)
Total Protein: 7.4 g/dL (ref 6.5–8.1)

## 2018-06-07 LAB — CBC WITH DIFFERENTIAL/PLATELET
Basophils Absolute: 0 10*3/uL (ref 0–0.1)
Basophils Relative: 0 %
EOS PCT: 2 %
Eosinophils Absolute: 0.1 10*3/uL (ref 0–0.7)
HCT: 45.5 % (ref 35.0–47.0)
Hemoglobin: 15.5 g/dL (ref 12.0–16.0)
LYMPHS ABS: 1.5 10*3/uL (ref 1.0–3.6)
LYMPHS PCT: 27 %
MCH: 31.5 pg (ref 26.0–34.0)
MCHC: 34 g/dL (ref 32.0–36.0)
MCV: 92.5 fL (ref 80.0–100.0)
MONO ABS: 0.6 10*3/uL (ref 0.2–0.9)
MONOS PCT: 10 %
Neutro Abs: 3.3 10*3/uL (ref 1.4–6.5)
Neutrophils Relative %: 61 %
Platelets: 272 10*3/uL (ref 150–440)
RBC: 4.92 MIL/uL (ref 3.80–5.20)
RDW: 13.7 % (ref 11.5–14.5)
WBC: 5.4 10*3/uL (ref 3.6–11.0)

## 2018-06-07 LAB — IRON AND TIBC
Iron: 80 ug/dL (ref 28–170)
Saturation Ratios: 18 % (ref 10.4–31.8)
TIBC: 446 ug/dL (ref 250–450)
UIBC: 366 ug/dL

## 2018-06-07 LAB — FERRITIN: FERRITIN: 51 ng/mL (ref 11–307)

## 2018-06-08 LAB — VITAMIN D 25 HYDROXY (VIT D DEFICIENCY, FRACTURES): VIT D 25 HYDROXY: 32.4 ng/mL (ref 30.0–100.0)

## 2018-06-11 ENCOUNTER — Encounter: Payer: Self-pay | Admitting: *Deleted

## 2018-06-15 ENCOUNTER — Other Ambulatory Visit: Payer: BLUE CROSS/BLUE SHIELD

## 2018-06-18 ENCOUNTER — Inpatient Hospital Stay: Payer: BLUE CROSS/BLUE SHIELD

## 2018-06-18 ENCOUNTER — Encounter: Payer: Self-pay | Admitting: Oncology

## 2018-06-18 ENCOUNTER — Other Ambulatory Visit: Payer: Self-pay

## 2018-06-18 ENCOUNTER — Inpatient Hospital Stay (HOSPITAL_BASED_OUTPATIENT_CLINIC_OR_DEPARTMENT_OTHER): Payer: BLUE CROSS/BLUE SHIELD | Admitting: Oncology

## 2018-06-18 ENCOUNTER — Other Ambulatory Visit: Payer: BLUE CROSS/BLUE SHIELD

## 2018-06-18 DIAGNOSIS — R5383 Other fatigue: Secondary | ICD-10-CM

## 2018-06-18 DIAGNOSIS — R740 Nonspecific elevation of levels of transaminase and lactic acid dehydrogenase [LDH]: Secondary | ICD-10-CM | POA: Diagnosis not present

## 2018-06-18 DIAGNOSIS — R7401 Elevation of levels of liver transaminase levels: Secondary | ICD-10-CM

## 2018-06-18 DIAGNOSIS — E559 Vitamin D deficiency, unspecified: Secondary | ICD-10-CM | POA: Diagnosis not present

## 2018-06-18 NOTE — Progress Notes (Signed)
Patient here today for follow up and phlebotomy

## 2018-06-18 NOTE — Progress Notes (Signed)
Hematology/Oncology Follow up note [facility] [phone] [phone]   Patient Care Team: Lavera Guise, MD as PCP - General (Internal Medicine)  REFERRING PROVIDER: Lavera Guise, MD REASON FOR VISIT Follow up for treatment of iron overload and hemochromatosis.    HISTORY OF PRESENTING ILLNESS:  Jennifer Barron is a  37 y.o.  female with PMH listed below who was referred to me for evaluation of elevated ferritin. Patient follows up with GYN for IUD placement, and was fine to have abnormal liver function test, mainly elevated transaminitis.  Patient went to primary care physician Dr. Humphrey Rolls And had additional testing.  She was found to have elevated ferritin level at 547, iron saturation 33, ultrasound of abdomen was done on January 12, 2018, which showed upper normal limits spleen size and increased echogenicity of liver questionable fatty liver. Patient was referred to [facility] for further evaluation of elevated ferritin. Currently patient takes spironolactone for acne and this was started by dermatologist a week ago.  She also takes phentermine for obesity, recently started. Patient reports feeling tired fatigue.  Denies any joint pain shortness of breath, lower extremity swelling.  She denies any family history of hemochromatosis.  INTERVAL HISTORY Jennifer Barron is a 37 y.o. female who has above history reviewed by me today presents for follow-up visit for management of iron overload and hemochromatosis. During the interval patient was seen by Dr. Marius Ditch.  Patient reports fatigue has been worse and Dr. Marius Ditch felt may be the fatigue level was caused by increased of iron level.  Therefore patient's iron panel was obtained prior to the scheduled visits.  Ferritin has reduced to 51, saturation 18, TIBC 446. Today patient continues to feel fatigued, otherwise no new complaints.  Fatigue has been chronic.  Patient reports fatigue level was better  previously after each phlebotomy episodes.  Review of Systems  Constitutional: Positive for malaise/fatigue. Negative for chills, diaphoresis, fever and weight loss.  HENT: Negative for congestion, ear discharge, ear pain, hearing loss, nosebleeds, sinus pain and sore throat.   Eyes: Negative for double vision, photophobia, pain, discharge and redness.  Respiratory: Negative for cough, hemoptysis, sputum production, shortness of breath and wheezing.   Cardiovascular: Negative for chest pain, palpitations, orthopnea, claudication and leg swelling.  Gastrointestinal: Negative for abdominal pain, blood in stool, constipation, diarrhea, heartburn, melena, nausea and vomiting.  Genitourinary: Negative for dysuria, flank pain, frequency, hematuria and urgency.  Musculoskeletal: Negative for back pain, myalgias and neck pain.  Skin: Negative for itching and rash.  Neurological: Negative for dizziness, tingling, tremors, sensory change, focal weakness, weakness and headaches.  Endo/Heme/Allergies: Negative for environmental allergies. Does not bruise/bleed easily.  Psychiatric/Behavioral: Negative for depression, hallucinations and substance abuse. The patient is not nervous/anxious.     MEDICAL HISTORY:  Past Medical History:  Diagnosis Date  . Endometriosis   . Migraine     SURGICAL HISTORY: Past Surgical History:  Procedure Laterality Date  . abdominal laproscopy    . ADENOIDECTOMY     twice  . TONSILLECTOMY Bilateral 1987  . WISDOM TOOTH EXTRACTION  2009    SOCIAL HISTORY: Social History   Socioeconomic History  . Marital status: Married    Spouse name: Not on file  . Number of children: Not on file  . Years of education: Not on file  . Highest education level: Not on file  Occupational History  . Not on file  Social Needs  . Financial resource strain: Not on file  .  Food insecurity:    Worry: Not on file    Inability: Not on file  . Transportation needs:    Medical:  Not on file    Non-medical: Not on file  Tobacco Use  . Smoking status: Never Smoker  . Smokeless tobacco: Never Used  Substance and Sexual Activity  . Alcohol use: Yes    Frequency: Never    Comment: social  . Drug use: No  . Sexual activity: Yes  Lifestyle  . Physical activity:    Days per week: Not on file    Minutes per session: Not on file  . Stress: Not on file  Relationships  . Social connections:    Talks on phone: Not on file    Gets together: Not on file    Attends religious service: Not on file    Active member of club or organization: Not on file    Attends meetings of clubs or organizations: Not on file    Relationship status: Not on file  . Intimate partner violence:    Fear of current or ex partner: Not on file    Emotionally abused: Not on file    Physically abused: Not on file    Forced sexual activity: Not on file  Other Topics Concern  . Not on file  Social History Narrative  . Not on file    FAMILY HISTORY: Family History  Problem Relation Age of Onset  . Ovarian cancer Paternal Grandmother   . Cancer Paternal Grandmother   . Prostate cancer Paternal Grandfather   . Cancer Paternal Grandfather   . Cancer Father     ALLERGIES:  has No Known Allergies.  MEDICATIONS:  Current Outpatient Medications  Medication Sig Dispense Refill  . cholecalciferol (VITAMIN D) 1000 units tablet Take 1 tablet (1,000 Units total) by mouth daily. 90 tablet 0  . phentermine 37.5 MG capsule Take 1 capsule (37.5 mg total) by mouth every morning. 30 capsule 0  . spironolactone (ALDACTONE) 50 MG tablet   4  . docusate sodium (COLACE) 100 MG capsule Take 1 capsule (100 mg total) by mouth daily. 30 capsule 0   No current facility-administered medications for this visit.      PHYSICAL EXAMINATION: ECOG PERFORMANCE STATUS: 1 - Symptomatic but completely ambulatory Vitals:   06/18/18 1333  BP: 129/89  Pulse: 68  Resp: 18  Temp: 97.7 F (36.5 C)   Filed Weights     06/18/18 1333  Weight: 186 lb 2 oz (84.4 kg)    Physical Exam  Constitutional: She is oriented to person, place, and time. She appears well-developed and well-nourished. No distress.  HENT:  Head: Normocephalic and atraumatic.  Mouth/Throat: Oropharynx is clear and moist.  Eyes: Pupils are equal, round, and reactive to light. Conjunctivae and EOM are normal. No scleral icterus.  Neck: Normal range of motion. Neck supple.  Cardiovascular: Normal rate, regular rhythm and normal heart sounds. Exam reveals no gallop and no friction rub.  No murmur heard. Pulmonary/Chest: Effort normal and breath sounds normal. No respiratory distress. She has no wheezes. She has no rales. She exhibits no tenderness.  Abdominal: Soft. Bowel sounds are normal. She exhibits no distension and no mass. There is no tenderness.  Musculoskeletal: Normal range of motion. She exhibits no edema or deformity.  Lymphadenopathy:    She has no cervical adenopathy.  Neurological: She is alert and oriented to person, place, and time. She displays normal reflexes. No cranial nerve deficit. Coordination normal.  Skin:  Skin is warm and dry. No rash noted. No erythema.  Psychiatric: She has a normal mood and affect. Her behavior is normal. Thought content normal.     LABORATORY DATA:  I have reviewed the data as listed Lab Results  Component Value Date   WBC 5.4 06/07/2018   HGB 15.5 06/07/2018   HCT 45.5 06/07/2018   MCV 92.5 06/07/2018   PLT 272 06/07/2018   Recent Labs    02/08/18 1550 02/13/18 0940 03/27/18 1105 06/07/18 0828  NA 128* 136  --  138  K 4.0 4.4  --  4.1  CL 96* 101  --  103  CO2 24 26  --  24  GLUCOSE 91 110*  --  104*  BUN 11 10  --  11  CREATININE 0.88 0.77  --  0.86  CALCIUM 9.6 10.0  --  9.6  GFRNONAA >60 >60  --  >60  GFRAA >60 >60  --  >60  PROT 7.5  --  7.7 7.4  ALBUMIN 4.5  --  4.6 4.4  AST 141*  --  139* 113*  ALT 238*  --  179* 153*  ALKPHOS 48  --  46 52  BILITOT 2.1*  --   1.5* 0.9  BILIDIR 0.3  --  0.2  --   IBILI  --   --  1.3*  --     Hepatitis B surface antigen negative, hepatitis C RNA nondetectable.  01/13/2108 US abdomen showed upper normal limits spleen size and increased echogenicity of liver questionable fatty liver.  ASSESSMENT & PLAN:  1. Iron overload   2. Hereditary hemochromatosis ([place])   3. Vitamin D deficiency   4. Transaminitis   5. Other fatigue    #Heterozygous H63D hemochromatosis. ferritin has decreased to 51. We will proceed phlebotomy 500 cc x 1 today to further decrease ferritin level to be less than 50 which will be her goal. Plan CBC, iron TIBC ferritin monthly.  She can follow-up with me in 3 months for possible phlebotomy.  During interval if iron level above goal we will proceed with additional phlebotomy.  #Fatigue, etiology unknown.  Chronic.  Thyroid function has been previously checked are normal.  I suspect her fatigue level may be related to functional iron deficiency. Continue to monitor.  #Vitamin D deficiency, continue vitamin D 1000 units daily.  Vitamin D level has improved. #Transaminitis and patient with hemochromatosis.  Continue follow-up with GI.  All questions were answered. The patient knows to call the clinic with any problems questions or concerns.  Return of visit: 3 months with repeat cbc, iron ferritin, vitamin D, labs to be done prior to visit.  Orders Placed This Encounter  Procedures  . CBC with Differential/Platelet    Standing Status:   Standing    Number of Occurrences:   6    Standing Expiration Date:   06/19/2019  . Iron and TIBC    Standing Status:   Standing    Number of Occurrences:   6    Standing Expiration Date:   06/19/2019  . Ferritin    Standing Status:   Standing    Number of Occurrences:   6    Standing Expiration Date:   06/19/2019   Total face to face encounter time for this patient visit was 74mn. >50% of the time was  spent in counseling and coordination of care.    ZEarlie Server MD, PhD Hematology Oncology [facility]  [phone] 06/18/2018 

## 2018-06-22 ENCOUNTER — Encounter: Payer: Self-pay | Admitting: Adult Health

## 2018-06-22 ENCOUNTER — Ambulatory Visit: Payer: Self-pay | Admitting: Adult Health

## 2018-06-22 DIAGNOSIS — K760 Fatty (change of) liver, not elsewhere classified: Secondary | ICD-10-CM | POA: Diagnosis not present

## 2018-06-22 MED ORDER — PHENTERMINE HCL 37.5 MG PO CAPS: 37.5000 mg | ORAL_CAPSULE | ORAL | 0 refills | Status: DC

## 2018-06-22 NOTE — Progress Notes (Signed)
[facility] [place], [place] [address]  Internal MEDICINE  Office Visit Note  Patient Name: Jennifer Barron  [id number]  [id number]  Date of Service: 07/08/2018  Chief Complaint  Patient presents with  . Medical Management of Chronic Issues    weight management    HPI Pt here for for multiple medical problems, and weight loss follow up. She reports her fatigue is improving some.  She has lost nine pounds since last visit on phentermine.  She reports she has been lifting weights and eating well.  She has been sleeping good.  She denies chest pain, sob, palpitations or other side effects.  She has been receiving phlebotomy for her elevated ferritin level and has been feeling much better.      Current Medication: Outpatient Encounter Medications as of 06/22/2018  Medication Sig  . cholecalciferol (VITAMIN D) 1000 units tablet Take 1 tablet (1,000 Units total) by mouth daily.  . phentermine 37.5 MG capsule Take 1 capsule (37.5 mg total) by mouth every morning.  Marland Kitchen spironolactone (ALDACTONE) 50 MG tablet   . [DISCONTINUED] phentermine 37.5 MG capsule Take 1 capsule (37.5 mg total) by mouth every morning.  . docusate sodium (COLACE) 100 MG capsule Take 1 capsule (100 mg total) by mouth daily.   No facility-administered encounter medications on file as of 06/22/2018.     Surgical History: Past Surgical History:  Procedure Laterality Date  . abdominal laproscopy    . ADENOIDECTOMY     twice  . TONSILLECTOMY Bilateral 1987  . WISDOM TOOTH EXTRACTION  2009    Medical History: Past Medical History:  Diagnosis Date  . Endometriosis   . Migraine     Family History: Family History  Problem Relation Age of Onset  . Ovarian cancer Paternal Grandmother   . Cancer Paternal Grandmother   . Prostate cancer Paternal Grandfather   . Cancer Paternal Grandfather   . Cancer Father     Social History   Socioeconomic History  . Marital status: Married     Spouse name: Not on file  . Number of children: Not on file  . Years of education: Not on file  . Highest education level: Not on file  Occupational History  . Not on file  Social Needs  . Financial resource strain: Not on file  . Food insecurity:    Worry: Not on file    Inability: Not on file  . Transportation needs:    Medical: Not on file    Non-medical: Not on file  Tobacco Use  . Smoking status: Never Smoker  . Smokeless tobacco: Never Used  Substance and Sexual Activity  . Alcohol use: Yes    Frequency: Never    Comment: social  . Drug use: No  . Sexual activity: Yes  Lifestyle  . Physical activity:    Days per week: Not on file    Minutes per session: Not on file  . Stress: Not on file  Relationships  . Social connections:    Talks on phone: Not on file    Gets together: Not on file    Attends religious service: Not on file    Active member of club or organization: Not on file    Attends meetings of clubs or organizations: Not on file    Relationship status: Not on file  . Intimate partner violence:    Fear of current or ex partner: Not on file    Emotionally abused: Not on file  Physically abused: Not on file    Forced sexual activity: Not on file  Other Topics Concern  . Not on file  Social History Narrative  . Not on file      Review of Systems  Constitutional: Negative for chills, fatigue and unexpected weight change.  HENT: Negative for congestion, rhinorrhea, sneezing and sore throat.   Eyes: Negative for photophobia, pain and redness.  Respiratory: Negative for cough, chest tightness and shortness of breath.   Cardiovascular: Negative for chest pain and palpitations.  Gastrointestinal: Negative for abdominal pain, constipation, diarrhea, nausea and vomiting.  Endocrine: Negative.   Genitourinary: Negative for dysuria and frequency.  Musculoskeletal: Negative for arthralgias, back pain, joint swelling and neck pain.  Skin: Negative for rash.   Allergic/Immunologic: Negative.   Neurological: Negative for tremors and numbness.  Hematological: Negative for adenopathy. Does not bruise/bleed easily.  Psychiatric/Behavioral: Negative for behavioral problems and sleep disturbance. The patient is not nervous/anxious.     Vital Signs: BP 120/80   Pulse (!) 107   Resp 16   Ht 5' 3"  (1.6 m)   Wt 181 lb (82.1 kg)   SpO2 97%   BMI 32.06 kg/m    Physical Exam  Constitutional: She is oriented to person, place, and time. She appears well-developed and well-nourished. No distress.  HENT:  Head: Normocephalic and atraumatic.  Mouth/Throat: Oropharynx is clear and moist. No oropharyngeal exudate.  Eyes: Pupils are equal, round, and reactive to light. EOM are normal.  Neck: Normal range of motion. Neck supple. No JVD present. No tracheal deviation present. No thyromegaly present.  Cardiovascular: Normal rate, regular rhythm and normal heart sounds. Exam reveals no gallop and no friction rub.  No murmur heard. Pulmonary/Chest: Effort normal and breath sounds normal. No respiratory distress. She has no wheezes. She has no rales. She exhibits no tenderness.  Abdominal: Soft. There is no tenderness. There is no guarding.  Musculoskeletal: Normal range of motion.  Lymphadenopathy:    She has no cervical adenopathy.  Neurological: She is alert and oriented to person, place, and time. No cranial nerve deficit.  Skin: Skin is warm and dry. She is not diaphoretic.  Psychiatric: She has a normal mood and affect. Her behavior is normal. Judgment and thought content normal.  Nursing note and vitals reviewed.  Assessment/Plan: 1. Iron overload Continue to see hematology for phlebotomy.  2. Morbid obesity ([place]) Pt is down 9 pounds.  Continue phentermine.  Follow up in 6 weeks. - phentermine 37.5 MG capsule; Take 1 capsule (37.5 mg total) by mouth every morning.  Dispense: 30 capsule; Refill: 0  3. Fatty liver disease, nonalcoholic Continue to  follow up with hematology.   General Counseling: Nawaal verbalizes understanding of the findings of todays visit and agrees with plan of treatment. I have discussed any further diagnostic evaluation that may be needed or ordered today. We also reviewed her medications today. she has been encouraged to call the office with any questions or concerns that should arise related to todays visit.   Meds ordered this encounter  Medications  . phentermine 37.5 MG capsule    Sig: Take 1 capsule (37.5 mg total) by mouth every morning.    Dispense:  30 capsule    Refill:  0    Please use tablets instead of capsules.    Time spent:25  Minutes   This patient was seen by Orson Gear AGNP-C in Collaboration with Dr Lavera Guise as a part of collaborative care agreement  Dr Lavera Guise Internal medicine

## 2018-06-22 NOTE — Patient Instructions (Signed)
Exercising to Lose Weight Exercising can help you to lose weight. In order to lose weight through exercise, you need to do vigorous-intensity exercise. You can tell that you are exercising with vigorous intensity if you are breathing very hard and fast and cannot hold a conversation while exercising. Moderate-intensity exercise helps to maintain your current weight. You can tell that you are exercising at a moderate level if you have a higher heart rate and faster breathing, but you are still able to hold a conversation. How often should I exercise? Choose an activity that you enjoy and set realistic goals. Your health care provider can help you to make an activity plan that works for you. Exercise regularly as directed by your health care provider. This may include:  Doing resistance training twice each week, such as: ? Push-ups. ? Sit-ups. ? Lifting weights. ? Using resistance bands.  Doing a given intensity of exercise for a given amount of time. Choose from these options: ? 150 minutes of moderate-intensity exercise every week. ? 75 minutes of vigorous-intensity exercise every week. ? A mix of moderate-intensity and vigorous-intensity exercise every week.  Children, pregnant women, people who are out of shape, people who are overweight, and older adults may need to consult a health care provider for individual recommendations. If you have any sort of medical condition, be sure to consult your health care provider before starting a new exercise program. What are some activities that can help me to lose weight?  Walking at a rate of at least 4.5 miles an hour.  Jogging or running at a rate of 5 miles per hour.  Biking at a rate of at least 10 miles per hour.  Lap swimming.  Roller-skating or in-line skating.  Cross-country skiing.  Vigorous competitive sports, such as football, basketball, and soccer.  Jumping rope.  Aerobic dancing. How can I be more active in my day-to-day  activities?  Use the stairs instead of the elevator.  Take a walk during your lunch break.  If you drive, park your car farther away from work or school.  If you take public transportation, get off one stop early and walk the rest of the way.  Make all of your phone calls while standing up and walking around.  Get up, stretch, and walk around every 30 minutes throughout the day. What guidelines should I follow while exercising?  Do not exercise so much that you hurt yourself, feel dizzy, or get very short of breath.  Consult your health care provider prior to starting a new exercise program.  Wear comfortable clothes and shoes with good support.  Drink plenty of water while you exercise to prevent dehydration or heat stroke. Body water is lost during exercise and must be replaced.  Work out until you breathe faster and your heart beats faster. This information is not intended to replace advice given to you by your health care provider. Make sure you discuss any questions you have with your health care provider. Document Released: 11/12/2010 Document Revised: 03/17/2016 Document Reviewed: 03/13/2014 Elsevier Interactive Patient Education  2018 [employer]  

## 2018-07-19 ENCOUNTER — Other Ambulatory Visit: Payer: Self-pay

## 2018-07-19 ENCOUNTER — Inpatient Hospital Stay: Payer: BLUE CROSS/BLUE SHIELD | Attending: Oncology

## 2018-07-19 LAB — IRON AND TIBC
Iron: 159 ug/dL (ref 28–170)
Saturation Ratios: 33 % — ABNORMAL HIGH (ref 10.4–31.8)
TIBC: 488 ug/dL — ABNORMAL HIGH (ref 250–450)
UIBC: 329 ug/dL

## 2018-07-19 LAB — CBC WITH DIFFERENTIAL/PLATELET
BASOS ABS: 0 10*3/uL (ref 0–0.1)
Basophils Relative: 0 %
Eosinophils Absolute: 0.1 10*3/uL (ref 0–0.7)
Eosinophils Relative: 1 %
HCT: 43.2 % (ref 35.0–47.0)
HEMOGLOBIN: 15 g/dL (ref 12.0–16.0)
LYMPHS PCT: 12 %
Lymphs Abs: 1.1 10*3/uL (ref 1.0–3.6)
MCH: 32 pg (ref 26.0–34.0)
MCHC: 34.8 g/dL (ref 32.0–36.0)
MCV: 92 fL (ref 80.0–100.0)
Monocytes Absolute: 0.8 10*3/uL (ref 0.2–0.9)
Monocytes Relative: 9 %
NEUTROS ABS: 7 10*3/uL — AB (ref 1.4–6.5)
NEUTROS PCT: 78 %
Platelets: 303 10*3/uL (ref 150–440)
RBC: 4.69 MIL/uL (ref 3.80–5.20)
RDW: 14.2 % (ref 11.5–14.5)
WBC: 8.9 10*3/uL (ref 3.6–11.0)

## 2018-07-19 LAB — FERRITIN: Ferritin: 150 ng/mL (ref 11–307)

## 2018-07-25 ENCOUNTER — Encounter: Payer: Self-pay | Admitting: Oncology

## 2018-07-31 ENCOUNTER — Inpatient Hospital Stay: Payer: BLUE CROSS/BLUE SHIELD | Attending: Oncology

## 2018-08-03 ENCOUNTER — Encounter: Payer: Self-pay | Admitting: Adult Health

## 2018-08-03 ENCOUNTER — Ambulatory Visit (INDEPENDENT_AMBULATORY_CARE_PROVIDER_SITE_OTHER): Payer: Self-pay | Admitting: Adult Health

## 2018-08-03 DIAGNOSIS — R7989 Other specified abnormal findings of blood chemistry: Secondary | ICD-10-CM | POA: Diagnosis not present

## 2018-08-03 MED ORDER — PHENTERMINE HCL 37.5 MG PO CAPS: 37.5000 mg | ORAL_CAPSULE | ORAL | 0 refills | Status: DC

## 2018-08-03 NOTE — Progress Notes (Signed)
[facility] [place], [place] [address]  Internal MEDICINE  Office Visit Note  Patient Name: Jennifer Barron  [id number]  [id number]  Date of Service: 08/03/2018  Chief Complaint  Patient presents with  . Medical Management of Chronic Issues    weight loss management     HPI Pt is here for follow up on weight loss.  She is currently taking Phentermine a few days a week.  She has drastically changed her diet and exercise plan.  She has lost 11 pounds since 06/22/2018.  She denies chest pain, sob or palpitations.  She reports feeling great and denies further need at this time.      Current Medication: Outpatient Encounter Medications as of 08/03/2018  Medication Sig  . cholecalciferol (VITAMIN D) 1000 units tablet Take 1 tablet (1,000 Units total) by mouth daily.  Marland Kitchen docusate sodium (COLACE) 100 MG capsule Take 1 capsule (100 mg total) by mouth daily.  . phentermine 37.5 MG capsule Take 1 capsule (37.5 mg total) by mouth every morning.  Marland Kitchen spironolactone (ALDACTONE) 50 MG tablet   . [DISCONTINUED] phentermine 37.5 MG capsule Take 1 capsule (37.5 mg total) by mouth every morning.  . [DISCONTINUED] phentermine 37.5 MG capsule Take 1 capsule (37.5 mg total) by mouth every morning.   No facility-administered encounter medications on file as of 08/03/2018.     Surgical History: Past Surgical History:  Procedure Laterality Date  . abdominal laproscopy    . ADENOIDECTOMY     twice  . TONSILLECTOMY Bilateral 1987  . WISDOM TOOTH EXTRACTION  2009    Medical History: Past Medical History:  Diagnosis Date  . Endometriosis   . Migraine     Family History: Family History  Problem Relation Age of Onset  . Ovarian cancer Paternal Grandmother   . Cancer Paternal Grandmother   . Prostate cancer Paternal Grandfather   . Cancer Paternal Grandfather   . Cancer Father     Social History   Socioeconomic History  . Marital status: Married    Spouse  name: Not on file  . Number of children: Not on file  . Years of education: Not on file  . Highest education level: Not on file  Occupational History  . Not on file  Social Needs  . Financial resource strain: Not on file  . Food insecurity:    Worry: Not on file    Inability: Not on file  . Transportation needs:    Medical: Not on file    Non-medical: Not on file  Tobacco Use  . Smoking status: Never Smoker  . Smokeless tobacco: Never Used  Substance and Sexual Activity  . Alcohol use: Yes    Frequency: Never    Comment: social  . Drug use: No  . Sexual activity: Yes  Lifestyle  . Physical activity:    Days per week: Not on file    Minutes per session: Not on file  . Stress: Not on file  Relationships  . Social connections:    Talks on phone: Not on file    Gets together: Not on file    Attends religious service: Not on file    Active member of club or organization: Not on file    Attends meetings of clubs or organizations: Not on file    Relationship status: Not on file  . Intimate partner violence:    Fear of current or ex partner: Not on file    Emotionally abused: Not  on file    Physically abused: Not on file    Forced sexual activity: Not on file  Other Topics Concern  . Not on file  Social History Narrative  . Not on file      Review of Systems  Constitutional: Negative for chills, fatigue and unexpected weight change.  HENT: Negative for congestion, rhinorrhea, sneezing and sore throat.   Eyes: Negative for photophobia, pain and redness.  Respiratory: Negative for cough, chest tightness and shortness of breath.   Cardiovascular: Negative for chest pain and palpitations.  Gastrointestinal: Negative for abdominal pain, constipation, diarrhea, nausea and vomiting.  Endocrine: Negative.   Genitourinary: Negative for dysuria and frequency.  Musculoskeletal: Negative for arthralgias, back pain, joint swelling and neck pain.  Skin: Negative for rash.   Allergic/Immunologic: Negative.   Neurological: Negative for tremors and numbness.  Hematological: Negative for adenopathy. Does not bruise/bleed easily.  Psychiatric/Behavioral: Negative for behavioral problems and sleep disturbance. The patient is not nervous/anxious.     Vital Signs: BP 118/82   Pulse 93   Resp 16   Ht 5' 3"  (1.6 m)   Wt 170 lb 6.4 oz (77.3 kg)   SpO2 97%   BMI 30.19 kg/m    Physical Exam  Constitutional: She is oriented to person, place, and time. She appears well-developed and well-nourished. No distress.  HENT:  Head: Normocephalic and atraumatic.  Mouth/Throat: Oropharynx is clear and moist. No oropharyngeal exudate.  Eyes: Pupils are equal, round, and reactive to light. EOM are normal.  Neck: Normal range of motion. Neck supple. No JVD present. No tracheal deviation present. No thyromegaly present.  Cardiovascular: Normal rate, regular rhythm and normal heart sounds. Exam reveals no gallop and no friction rub.  No murmur heard. Pulmonary/Chest: Effort normal and breath sounds normal. No respiratory distress. She has no wheezes. She has no rales. She exhibits no tenderness.  Abdominal: Soft. There is no tenderness. There is no guarding.  Musculoskeletal: Normal range of motion.  Lymphadenopathy:    She has no cervical adenopathy.  Neurological: She is alert and oriented to person, place, and time. No cranial nerve deficit.  Skin: Skin is warm and dry. She is not diaphoretic.  Psychiatric: She has a normal mood and affect. Her behavior is normal. Judgment and thought content normal.  Nursing note and vitals reviewed.  Assessment/Plan: 1. Morbid obesity ([place]) Continue current diet and exercise plan.  Use Phentermine.   - phentermine 37.5 MG capsule; Take 1 capsule (37.5 mg total) by mouth every morning.  Dispense: 30 capsule; Refill: 0  2. Hereditary hemochromatosis ([place]) Stable, will continue to follow.   3. Elevated ferritin Will continue to  monitor.  General Counseling: Xochil verbalizes understanding of the findings of todays visit and agrees with plan of treatment. I have discussed any further diagnostic evaluation that may be needed or ordered today. We also reviewed her medications today. she has been encouraged to call the office with any questions or concerns that should arise related to todays visit.    No orders of the defined types were placed in this encounter.   Meds ordered this encounter  Medications  . DISCONTD: phentermine 37.5 MG capsule    Sig: Take 1 capsule (37.5 mg total) by mouth every morning.    Dispense:  30 capsule    Refill:  0    Please use tablets instead of capsules.  . phentermine 37.5 MG capsule    Sig: Take 1 capsule (37.5 mg total) by mouth  every morning.    Dispense:  30 capsule    Refill:  0    Please use tablets instead of capsules.    Time spent: 25 Minutes   This patient was seen by Orson Gear AGNP-C in Collaboration with Dr Lavera Guise as a part of collaborative care agreement     Kendell Bane AGNP-C Internal medicine

## 2018-08-03 NOTE — Patient Instructions (Signed)
Phendimetrazine extended-release capsules What is this medicine? PHENDIMETRAZINE (fen dye MET ra zeen) decreases your appetite. It is used with a reduced calorie diet and exercise to help you lose weight. This medicine may be used for other purposes; ask your health care provider or pharmacist if you have questions. COMMON BRAND NAME(S): Bontril SR, [place] [address], Prelu-2 What should I tell my health care provider before I take this medicine? They need to know if you have any of these conditions: -agitation or nervousness -diabetes -glaucoma -heart disease -high blood pressure -history of substance abuse -kidney disease -lung disease called Primary Pulmonary Hypertension (PPH) -taken an MAOI like Carbex, Eldepryl, Marplan, Nardil, or Parnate in last 14 days -thyroid disease -an unusual or allergic reaction to phendimetrazine, other medicines, foods, dyes, or preservatives -pregnant or trying to get pregnant -breast-feeding How should I use this medicine? Take this medicine by mouth with a glass of water. Follow the directions on the prescription label. Take this medicine 30 to 60 minutes before breakfast. Swallow whole. Do not open or chew the capsules. Do not take your medicine more often than directed. Do not suddenly stop taking your medicine because you may develop a severe reaction. Your doctor will tell you how much medicine to take. If your doctor wants you to stop the medicine, the dose will be slowly lowered over time to avoid any side effects. Talk to your pediatrician regarding the use of this medicine in children. While this medicine may be prescribed for children as young as 37 years of age for selected conditions, precautions do apply. Overdosage: If you think you have taken too much of this medicine contact a poison control center or emergency room at once. NOTE: This medicine is only for you. Do not share this medicine with others. What if I miss a dose? If you miss  a dose, take it as soon as you can. If it is almost time for your next dose, take only that dose. Do not take double or extra doses. What may interact with this medicine? Do not take this medicine with any of the following medications: -MAOIs like Carbex, Eldepryl, Marplan, Nardil, and Parnate -medicines for colds or breathing difficulties like pseudoephedrine or phenylephrine -procarbazine -sibutramine -stimulants like dexmethylphenidate, methylphenidate or modafinil This medicine may also interact with the following medications: -medicines for diabetes -medicines for high blood pressure This list may not describe all possible interactions. Give your health care provider a list of all the medicines, herbs, non-prescription drugs, or dietary supplements you use. Also tell them if you smoke, drink alcohol, or use illegal drugs. Some items may interact with your medicine. What should I watch for while using this medicine? Notify your physician immediately if you become short of breath while doing your normal activities. Do not take this medicine within 6 hours of bedtime. It can keep you from getting to sleep. Avoid drinks that contain caffeine and try to stick to a regular bedtime every night. This medicine was intended to be used in addition to a healthy diet and exercise. The best results are achieved this way. This medicine is only indicated for short-term use. Eventually your weight loss may level out. At that point, the drug will only help you maintain your new weight. Do not increase or in any way change your dose without consulting your doctor. You may get drowsy or dizzy. Do not drive, use machinery, or do anything that needs mental alertness until you know how this medicine affects you. Do  not stand or sit up quickly, especially if you are an older patient. This reduces the risk of dizzy or fainting spells. Alcohol may increase dizziness and drowsiness. Avoid alcoholic drinks. What side  effects may I notice from receiving this medicine? Side effects that you should report to your doctor or health care professional as soon as possible: -chest pain, palpitations -depression or severe changes in mood -increased blood pressure -irritability -nervousness or restlessness -painful urination -severe dizziness -vomiting Side effects that usually do not require medical attention (report to your doctor or health care professional if they continue or are bothersome): -blurred vision or other eye problems -changes in sexual ability or desire -constipation or diarrhea -difficulty sleeping -dry mouth -flushing -headache -nausea -sweating This list may not describe all possible side effects. Call your doctor for medical advice about side effects. You may report side effects to FDA at 1-800-FDA-1088. Where should I keep my medicine? Keep out of the reach of children. This medicine can be abused. Keep your medicine in a safe place to protect it from theft. Do not share this medicine with anyone. Selling or giving away this medicine is dangerous and against the law. This medicine may cause accidental overdose and death if taken by other adults, children, or pets. Mix any unused medicine with a substance like cat litter or coffee grounds. Then throw the medicine away in a sealed container like a sealed bag or a coffee can with a lid. Do not use the medicine after the expiration date. Store between 20 and 25 degrees C (68 and 77 degrees F). NOTE: This sheet is a summary. It may not cover all possible information. If you have questions about this medicine, talk to your doctor, pharmacist, or health care provider.  2018 Elsevier/Gold Standard (2014-07-01 16:17:27)

## 2018-08-07 ENCOUNTER — Inpatient Hospital Stay: Payer: BLUE CROSS/BLUE SHIELD

## 2018-08-07 NOTE — Progress Notes (Signed)
Phlebotomy performed today with 500 ml removed, as ordered. Jennifer Barron tolerated treatment well. Vital signs stable.

## 2018-08-16 ENCOUNTER — Inpatient Hospital Stay: Payer: BLUE CROSS/BLUE SHIELD

## 2018-08-16 ENCOUNTER — Other Ambulatory Visit: Payer: Self-pay

## 2018-08-16 LAB — IRON AND TIBC
Iron: 26 ug/dL — ABNORMAL LOW (ref 28–170)
SATURATION RATIOS: 5 % — AB (ref 10.4–31.8)
TIBC: 523 ug/dL — ABNORMAL HIGH (ref 250–450)
UIBC: 497 ug/dL

## 2018-08-16 LAB — CBC WITH DIFFERENTIAL/PLATELET
ABS IMMATURE GRANULOCYTES: 0.03 10*3/uL (ref 0.00–0.07)
BASOS ABS: 0 10*3/uL (ref 0.0–0.1)
Basophils Relative: 0 %
EOS PCT: 1 %
Eosinophils Absolute: 0.1 10*3/uL (ref 0.0–0.5)
HEMATOCRIT: 36.1 % (ref 36.0–46.0)
HEMOGLOBIN: 11.8 g/dL — AB (ref 12.0–15.0)
Immature Granulocytes: 0 %
LYMPHS ABS: 1.3 10*3/uL (ref 0.7–4.0)
Lymphocytes Relative: 15 %
MCH: 30.2 pg (ref 26.0–34.0)
MCHC: 32.7 g/dL (ref 30.0–36.0)
MCV: 92.3 fL (ref 80.0–100.0)
MONO ABS: 0.6 10*3/uL (ref 0.1–1.0)
Monocytes Relative: 8 %
NEUTROS ABS: 6.2 10*3/uL (ref 1.7–7.7)
NEUTROS PCT: 76 %
NRBC: 0 % (ref 0.0–0.2)
Platelets: 349 10*3/uL (ref 150–400)
RBC: 3.91 MIL/uL (ref 3.87–5.11)
RDW: 11.6 % (ref 11.5–15.5)
WBC: 8.2 10*3/uL (ref 4.0–10.5)

## 2018-08-16 LAB — FERRITIN: FERRITIN: 21 ng/mL (ref 11–307)

## 2018-09-04 ENCOUNTER — Telehealth: Payer: BLUE CROSS/BLUE SHIELD | Admitting: Family Medicine

## 2018-09-04 DIAGNOSIS — N390 Urinary tract infection, site not specified: Secondary | ICD-10-CM

## 2018-09-04 MED ORDER — CEPHALEXIN 500 MG PO CAPS: 500.0000 mg | ORAL_CAPSULE | Freq: Two times a day (BID) | ORAL | 0 refills | Status: AC

## 2018-09-04 NOTE — Progress Notes (Signed)

## 2018-09-14 ENCOUNTER — Encounter: Payer: Self-pay | Admitting: Adult Health

## 2018-09-14 ENCOUNTER — Other Ambulatory Visit: Payer: Self-pay

## 2018-09-14 ENCOUNTER — Inpatient Hospital Stay: Payer: BLUE CROSS/BLUE SHIELD | Attending: Oncology

## 2018-09-14 ENCOUNTER — Ambulatory Visit: Payer: Self-pay | Admitting: Adult Health

## 2018-09-14 DIAGNOSIS — E559 Vitamin D deficiency, unspecified: Secondary | ICD-10-CM

## 2018-09-14 DIAGNOSIS — R7989 Other specified abnormal findings of blood chemistry: Secondary | ICD-10-CM

## 2018-09-14 DIAGNOSIS — E876 Hypokalemia: Secondary | ICD-10-CM | POA: Insufficient documentation

## 2018-09-14 LAB — COMPREHENSIVE METABOLIC PANEL
ALBUMIN: 4.7 g/dL (ref 3.5–5.0)
ALT: 88 U/L — ABNORMAL HIGH (ref 0–44)
ANION GAP: 9 (ref 5–15)
AST: 65 U/L — ABNORMAL HIGH (ref 15–41)
Alkaline Phosphatase: 53 U/L (ref 38–126)
BILIRUBIN TOTAL: 1 mg/dL (ref 0.3–1.2)
BUN: 12 mg/dL (ref 6–20)
CO2: 25 mmol/L (ref 22–32)
Calcium: 10.3 mg/dL (ref 8.9–10.3)
Chloride: 98 mmol/L (ref 98–111)
Creatinine, Ser: 0.91 mg/dL (ref 0.44–1.00)
GFR calc non Af Amer: >60 mL/min (ref >60)
GLUCOSE: 107 mg/dL — AB (ref 70–99)
POTASSIUM: 5.2 mmol/L — AB (ref 3.5–5.1)
SODIUM: 132 mmol/L — AB (ref 135–145)
TOTAL PROTEIN: 7.9 g/dL (ref 6.5–8.1)

## 2018-09-14 LAB — CBC WITH DIFFERENTIAL/PLATELET
ABS IMMATURE GRANULOCYTES: 0.02 10*3/uL (ref 0.00–0.07)
Basophils Absolute: 0 10*3/uL (ref 0.0–0.1)
Basophils Relative: 0 %
Eosinophils Absolute: 0 10*3/uL (ref 0.0–0.5)
Eosinophils Relative: 0 %
HEMATOCRIT: 39.6 % (ref 36.0–46.0)
HEMOGLOBIN: 13 g/dL (ref 12.0–15.0)
IMMATURE GRANULOCYTES: 0 %
LYMPHS ABS: 1.2 10*3/uL (ref 0.7–4.0)
LYMPHS PCT: 13 %
MCH: 29 pg (ref 26.0–34.0)
MCHC: 32.8 g/dL (ref 30.0–36.0)
MCV: 88.2 fL (ref 80.0–100.0)
MONOS PCT: 8 %
Monocytes Absolute: 0.8 10*3/uL (ref 0.1–1.0)
NEUTROS PCT: 79 %
NRBC: 0 % (ref 0.0–0.2)
Neutro Abs: 7.2 10*3/uL (ref 1.7–7.7)
Platelets: 378 10*3/uL (ref 150–400)
RBC: 4.49 MIL/uL (ref 3.87–5.11)
RDW: 11.9 % (ref 11.5–15.5)
WBC: 9.2 10*3/uL (ref 4.0–10.5)

## 2018-09-14 LAB — IRON AND TIBC
Iron: 49 ug/dL (ref 28–170)
Saturation Ratios: 8 % — ABNORMAL LOW (ref 10.4–31.8)
TIBC: 596 ug/dL — ABNORMAL HIGH (ref 250–450)
UIBC: 547 ug/dL

## 2018-09-14 LAB — FERRITIN: FERRITIN: 20 ng/mL (ref 11–307)

## 2018-09-14 MED ORDER — PHENTERMINE HCL 37.5 MG PO CAPS: 37.5000 mg | ORAL_CAPSULE | ORAL | 0 refills | Status: DC

## 2018-09-14 NOTE — Progress Notes (Signed)
[facility] [place], [place] [address]  Internal MEDICINE  Office Visit Note  Patient Name: Jennifer Barron  [id number]  [id number]  Date of Service: 09/14/2018  Chief Complaint  Patient presents with  . Medical Management of Chronic Issues    weight loss management     HPI Patient here for follow-up on weight loss.  She is currently taking phentermine and has lost 7 pounds since her last visit 6 weeks ago.  She is doing an excellent job of sticking with her diet and exercise plan.  She is now lost 18 pounds total since August 30 of this year.  She denies any chest pain, shortness of breath, palpitations, or other side effects of the medication.  She denies any new complaints at this time.    Current Medication: Outpatient Encounter Medications as of 09/14/2018  Medication Sig  . cholecalciferol (VITAMIN D) 1000 units tablet Take 1 tablet (1,000 Units total) by mouth daily.  . phentermine 37.5 MG capsule Take 1 capsule (37.5 mg total) by mouth every morning.  Marland Kitchen spironolactone (ALDACTONE) 50 MG tablet   . [DISCONTINUED] phentermine 37.5 MG capsule Take 1 capsule (37.5 mg total) by mouth every morning.  . docusate sodium (COLACE) 100 MG capsule Take 1 capsule (100 mg total) by mouth daily.   No facility-administered encounter medications on file as of 09/14/2018.     Surgical History: Past Surgical History:  Procedure Laterality Date  . abdominal laproscopy    . ADENOIDECTOMY     twice  . TONSILLECTOMY Bilateral 1987  . WISDOM TOOTH EXTRACTION  2009    Medical History: Past Medical History:  Diagnosis Date  . Endometriosis   . Migraine     Family History: Family History  Problem Relation Age of Onset  . Ovarian cancer Paternal Grandmother   . Cancer Paternal Grandmother   . Prostate cancer Paternal Grandfather   . Cancer Paternal Grandfather   . Cancer Father     Social History   Socioeconomic History  . Marital status:  Married    Spouse name: Not on file  . Number of children: Not on file  . Years of education: Not on file  . Highest education level: Not on file  Occupational History  . Not on file  Social Needs  . Financial resource strain: Not on file  . Food insecurity:    Worry: Not on file    Inability: Not on file  . Transportation needs:    Medical: Not on file    Non-medical: Not on file  Tobacco Use  . Smoking status: Never Smoker  . Smokeless tobacco: Never Used  Substance and Sexual Activity  . Alcohol use: Yes    Frequency: Never    Comment: social  . Drug use: No  . Sexual activity: Yes  Lifestyle  . Physical activity:    Days per week: Not on file    Minutes per session: Not on file  . Stress: Not on file  Relationships  . Social connections:    Talks on phone: Not on file    Gets together: Not on file    Attends religious service: Not on file    Active member of club or organization: Not on file    Attends meetings of clubs or organizations: Not on file    Relationship status: Not on file  . Intimate partner violence:    Fear of current or ex partner: Not on file  Emotionally abused: Not on file    Physically abused: Not on file    Forced sexual activity: Not on file  Other Topics Concern  . Not on file  Social History Narrative  . Not on file      Review of Systems  Constitutional: Negative for chills, fatigue and unexpected weight change.  HENT: Negative for congestion, rhinorrhea, sneezing and sore throat.   Eyes: Negative for photophobia, pain and redness.  Respiratory: Negative for cough, chest tightness and shortness of breath.   Cardiovascular: Negative for chest pain and palpitations.  Gastrointestinal: Negative for abdominal pain, constipation, diarrhea, nausea and vomiting.  Endocrine: Negative.   Genitourinary: Negative for dysuria and frequency.  Musculoskeletal: Negative for arthralgias, back pain, joint swelling and neck pain.  Skin:  Negative for rash.  Allergic/Immunologic: Negative.   Neurological: Negative for tremors and numbness.  Hematological: Negative for adenopathy. Does not bruise/bleed easily.  Psychiatric/Behavioral: Negative for behavioral problems and sleep disturbance. The patient is not nervous/anxious.     Vital Signs: BP 130/90 (BP Location: Right Arm, Patient Position: Sitting, Cuff Size: Normal)   Pulse (!) 101   Resp 16   Ht 5' 3"  (1.6 m)   Wt 163 lb (73.9 kg)   SpO2 99%   BMI 28.87 kg/m    Physical Exam  Constitutional: She is oriented to person, place, and time. She appears well-developed and well-nourished. No distress.  HENT:  Head: Normocephalic and atraumatic.  Mouth/Throat: Oropharynx is clear and moist. No oropharyngeal exudate.  Eyes: Pupils are equal, round, and reactive to light. EOM are normal.  Neck: Normal range of motion. Neck supple. No JVD present. No tracheal deviation present. No thyromegaly present.  Cardiovascular: Normal rate, regular rhythm and normal heart sounds. Exam reveals no gallop and no friction rub.  No murmur heard. Pulmonary/Chest: Effort normal and breath sounds normal. No respiratory distress. She has no wheezes. She has no rales. She exhibits no tenderness.  Abdominal: Soft. There is no tenderness. There is no guarding.  Musculoskeletal: Normal range of motion.  Lymphadenopathy:    She has no cervical adenopathy.  Neurological: She is alert and oriented to person, place, and time. No cranial nerve deficit.  Skin: Skin is warm and dry. She is not diaphoretic.  Psychiatric: She has a normal mood and affect. Her behavior is normal. Judgment and thought content normal.  Nursing note and vitals reviewed.  Assessment/Plan: 1. Morbid obesity (H[facility]) Obesity Counseling: Risk Assessment: An assessment of behavioral risk factors was made today and includes lack of exercise sedentary lifestyle, lack of portion control and poor dietary habits.  Risk  Modification Advice: She was counseled on portion control guidelines. Restricting daily caloric intake to. . The detrimental long term effects of obesity on her health and ongoing poor compliance was also discussed with the patient.  There is a liability release in patients' chart. There has been a 10 minute discussion about the side effects including but not limited to elevated blood pressure, anxiety, lack of sleep and dry mouth. Pt understands and will like to start/continue on appetite suppressant at this time. There will be one month RX given at the time of visit with proper follow up. [place] diet plan with restricted calories is given to the pt. Pt understands and agrees with  plan of treatment  - phentermine 37.5 MG capsule; Take 1 capsule (37.5 mg total) by mouth every morning.  Dispense: 30 capsule; Refill: 0 Refilled Controlled medications today. Reviewed risks and possible side  effects associated with taking Stimulants. Combination of these drugs with other psychotropic medications could cause dizziness and drowsiness. Pt needs to Monitor symptoms and exercise caution in driving and operating heavy machinery to avoid damages to oneself, to others and to the surroundings. Patient verbalized understanding in this matter. Dependence and abuse for these drugs will be monitored closely. A Controlled substance policy and procedure is on file which allows [facility] to order a urine drug screen test at any visit. Patient understands and agrees with the plan..  2. Hereditary hemochromatosis ([place]) Patient recently seen hematology, labs drawn and she will continue to be followed by them.  3. Elevated ferritin Most recent ferritin level is within normal limits, she continues to be followed by hematology.  General Counseling: Vinnie verbalizes understanding of the findings of todays visit and agrees with plan of treatment. I have discussed any further diagnostic evaluation that may be  needed or ordered today. We also reviewed her medications today. she has been encouraged to call the office with any questions or concerns that should arise related to todays visit.    No orders of the defined types were placed in this encounter.   Meds ordered this encounter  Medications  . phentermine 37.5 MG capsule    Sig: Take 1 capsule (37.5 mg total) by mouth every morning.    Dispense:  30 capsule    Refill:  0    Please use tablets instead of capsules.    Time spent: 25 Minutes   This patient was seen by Orson Gear AGNP-C in Collaboration with Dr Lavera Guise as a part of collaborative care agreement     Kendell Bane AGNP-C Internal medicine

## 2018-09-14 NOTE — Patient Instructions (Signed)

## 2018-09-17 ENCOUNTER — Other Ambulatory Visit: Payer: Self-pay

## 2018-09-17 ENCOUNTER — Encounter: Payer: Self-pay | Admitting: Oncology

## 2018-09-17 ENCOUNTER — Inpatient Hospital Stay (HOSPITAL_BASED_OUTPATIENT_CLINIC_OR_DEPARTMENT_OTHER): Payer: BLUE CROSS/BLUE SHIELD | Admitting: Oncology

## 2018-09-17 ENCOUNTER — Other Ambulatory Visit: Payer: BLUE CROSS/BLUE SHIELD

## 2018-09-17 ENCOUNTER — Inpatient Hospital Stay: Payer: BLUE CROSS/BLUE SHIELD

## 2018-09-17 DIAGNOSIS — E876 Hypokalemia: Secondary | ICD-10-CM | POA: Diagnosis not present

## 2018-09-17 DIAGNOSIS — E559 Vitamin D deficiency, unspecified: Secondary | ICD-10-CM | POA: Diagnosis not present

## 2018-09-17 NOTE — Progress Notes (Signed)
Patient here for follow up

## 2018-09-17 NOTE — Progress Notes (Signed)
Hematology/Oncology Follow up note [facility] [phone] [phone]   Patient Care Team: Lavera Guise, MD as PCP - General (Internal Medicine)  REFERRING PROVIDER: Lavera Guise, MD REASON FOR VISIT Follow up for treatment of iron overload and hemochromatosis.    HISTORY OF PRESENTING ILLNESS:  Jennifer Barron is a  37 y.o.  female with PMH listed below who was referred to me for evaluation of elevated ferritin. Patient follows up with GYN for IUD placement, and was fine to have abnormal liver function test, mainly elevated transaminitis.  Patient went to primary care physician Dr. Humphrey Rolls And had additional testing.  She was found to have elevated ferritin level at 547, iron saturation 33, ultrasound of abdomen was done on January 12, 2018, which showed upper normal limits spleen size and increased echogenicity of liver questionable fatty liver. Patient was referred to [facility] for further evaluation of elevated ferritin. Currently patient takes spironolactone for acne and this was started by dermatologist a week ago.  She also takes phentermine for obesity, recently started. Patient reports feeling tired fatigue.  Denies any joint pain shortness of breath, lower extremity swelling.  She denies any family history of hemochromatosis.  INTERVAL HISTORY Jennifer Barron is a 37 y.o. female who has above history reviewed by me today presents for follow-up visit for management of iron overload and hemochromatosis.  During the interval, patient reports fatigue is better.  Her hand pain is also better.  No new complaints.  Review of Systems  Constitutional: Positive for malaise/fatigue. Negative for chills, diaphoresis, fever and weight loss.  HENT: Negative for congestion, ear discharge, ear pain, hearing loss, nosebleeds, sinus pain and sore throat.   Eyes: Negative for double vision, photophobia, pain, discharge and redness.  Respiratory:  Negative for cough, hemoptysis, sputum production, shortness of breath and wheezing.   Cardiovascular: Negative for chest pain, palpitations, orthopnea, claudication and leg swelling.  Gastrointestinal: Negative for abdominal pain, blood in stool, constipation, diarrhea, heartburn, melena, nausea and vomiting.  Genitourinary: Negative for dysuria, flank pain, frequency, hematuria and urgency.  Musculoskeletal: Negative for back pain, myalgias and neck pain.  Skin: Negative for itching and rash.  Neurological: Negative for dizziness, tingling, tremors, sensory change, focal weakness, weakness and headaches.  Endo/Heme/Allergies: Negative for environmental allergies. Does not bruise/bleed easily.  Psychiatric/Behavioral: Negative for depression, hallucinations and substance abuse. The patient is not nervous/anxious.     MEDICAL HISTORY:  Past Medical History:  Diagnosis Date  . Endometriosis   . Migraine     SURGICAL HISTORY: Past Surgical History:  Procedure Laterality Date  . abdominal laproscopy    . ADENOIDECTOMY     twice  . TONSILLECTOMY Bilateral 1987  . WISDOM TOOTH EXTRACTION  2009    SOCIAL HISTORY: Social History   Socioeconomic History  . Marital status: Married    Spouse name: Not on file  . Number of children: Not on file  . Years of education: Not on file  . Highest education level: Not on file  Occupational History  . Not on file  Social Needs  . Financial resource strain: Not on file  . Food insecurity:    Worry: Not on file    Inability: Not on file  . Transportation needs:    Medical: Not on file    Non-medical: Not on file  Tobacco Use  . Smoking status: Never Smoker  . Smokeless tobacco: Never Used  Substance and Sexual Activity  . Alcohol use: Yes  Frequency: Never    Comment: social  . Drug use: No  . Sexual activity: Yes  Lifestyle  . Physical activity:    Days per week: Not on file    Minutes per session: Not on file  . Stress: Not  on file  Relationships  . Social connections:    Talks on phone: Not on file    Gets together: Not on file    Attends religious service: Not on file    Active member of club or organization: Not on file    Attends meetings of clubs or organizations: Not on file    Relationship status: Not on file  . Intimate partner violence:    Fear of current or ex partner: Not on file    Emotionally abused: Not on file    Physically abused: Not on file    Forced sexual activity: Not on file  Other Topics Concern  . Not on file  Social History Narrative  . Not on file    FAMILY HISTORY: Family History  Problem Relation Age of Onset  . Ovarian cancer Paternal Grandmother   . Cancer Paternal Grandmother   . Prostate cancer Paternal Grandfather   . Cancer Paternal Grandfather   . Cancer Father     ALLERGIES:  has No Known Allergies.  MEDICATIONS:  Current Outpatient Medications  Medication Sig Dispense Refill  . cholecalciferol (VITAMIN D) 1000 units tablet Take 1 tablet (1,000 Units total) by mouth daily. 90 tablet 0  . phentermine 37.5 MG capsule Take 1 capsule (37.5 mg total) by mouth every morning. 30 capsule 0  . spironolactone (ALDACTONE) 50 MG tablet   4  . docusate sodium (COLACE) 100 MG capsule Take 1 capsule (100 mg total) by mouth daily. 30 capsule 0   No current facility-administered medications for this visit.      PHYSICAL EXAMINATION: ECOG PERFORMANCE STATUS: 1 - Symptomatic but completely ambulatory Vitals:   09/17/18 1318  BP: 134/89  Pulse: 82  Resp: 18  Temp: 98 F (36.7 C)   Filed Weights   09/17/18 1318  Weight: 169 lb 8 oz (76.9 kg)    Physical Exam  Constitutional: She is oriented to person, place, and time. No distress.  HENT:  Head: Normocephalic and atraumatic.  Mouth/Throat: Oropharynx is clear and moist.  Eyes: Pupils are equal, round, and reactive to light. Conjunctivae and EOM are normal. No scleral icterus.  Neck: Normal range of motion.  Neck supple.  Cardiovascular: Normal rate, regular rhythm and normal heart sounds. Exam reveals no gallop and no friction rub.  No murmur heard. Pulmonary/Chest: Effort normal and breath sounds normal. No respiratory distress. She has no wheezes. She has no rales. She exhibits no tenderness.  Abdominal: Soft. Bowel sounds are normal. She exhibits no distension and no mass. There is no tenderness.  Musculoskeletal: Normal range of motion. She exhibits no edema or deformity.  Lymphadenopathy:    She has no cervical adenopathy.  Neurological: She is alert and oriented to person, place, and time. She displays normal reflexes. No cranial nerve deficit. Coordination normal.  Skin: Skin is warm and dry. No rash noted. No erythema.  Psychiatric: She has a normal mood and affect.     LABORATORY DATA:  I have reviewed the data as listed Lab Results  Component Value Date   WBC 9.2 09/14/2018   HGB 13.0 09/14/2018   HCT 39.6 09/14/2018   MCV 88.2 09/14/2018   PLT 378 09/14/2018   Recent Labs  02/08/18 1550 02/13/18 0940 03/27/18 1105 06/07/18 0828 09/14/18 0811  NA 128* 136  --  138 132*  K 4.0 4.4  --  4.1 5.2*  CL 96* 101  --  103 98  CO2 24 26  --  24 25  GLUCOSE 91 110*  --  104* 107*  BUN 11 10  --  11 12  CREATININE 0.88 0.77  --  0.86 0.91  CALCIUM 9.6 10.0  --  9.6 10.3  GFRNONAA >60 >60  --  >60 >60  GFRAA >60 >60  --  >60 >60  PROT 7.5  --  7.7 7.4 7.9  ALBUMIN 4.5  --  4.6 4.4 4.7  AST 141*  --  139* 113* 65*  ALT 238*  --  179* 153* 88*  ALKPHOS 48  --  46 52 53  BILITOT 2.1*  --  1.5* 0.9 1.0  BILIDIR 0.3  --  0.2  --   --   IBILI  --   --  1.3*  --   --     Hepatitis B surface antigen negative, hepatitis C RNA nondetectable.  01/13/2108 US abdomen showed upper normal limits spleen size and increased echogenicity of liver questionable fatty liver.  ASSESSMENT & PLAN:  1. Iron overload   2. Hereditary hemochromatosis ([place])    #Heterozygous H63D  hemochromatosis/iron overload/transaminitis Labs reviewed and discussed with patient. Hemoglobin stable.  Iron panel is consistent with iron deficiency, ferritin 20, decreased iron saturation and high TIBC. Hold additional phlebotomy for now.  Ferritin is at goal Patient will repeat lab work-up in 4 weeks and follow-up in the clinic in 8 weeks for evaluation of need of phlebotomy. Patient knows to avoid uncooked seafood and alcohol. Transaminitis has improved as well.  #Vitamin D deficiency, continue vitamin D 1000 units daily. Marland Kitchen #Hypokalemia, potassium 5.2.  Patient is on spironolactone for dermatology purpose.  Advised patient to avoid potassium rich food.  Advised patient to follow-up with dermatology and have potassium monitored.  All questions were answered. The patient knows to call the clinic with any problems questions or concerns.  Return of visit: 67month with repeat cbc,bmp  iron ferritin,  labs to be done prior to visit.  Orders Placed This Encounter  Procedures  . Basic metabolic panel    Standing Status:   Future    Standing Expiration Date:   09/18/2019     ZEarlie Server MD, PhD Hematology Oncology CBedford County Medical Centerat AWest Tennessee Healthcare - Volunteer HospitalPager- [phone]/25/2019

## 2018-10-15 ENCOUNTER — Other Ambulatory Visit: Payer: Self-pay

## 2018-10-15 ENCOUNTER — Other Ambulatory Visit: Payer: BLUE CROSS/BLUE SHIELD

## 2018-10-15 ENCOUNTER — Inpatient Hospital Stay: Payer: BLUE CROSS/BLUE SHIELD | Attending: Oncology

## 2018-10-15 LAB — CBC WITH DIFFERENTIAL/PLATELET
Abs Immature Granulocytes: 0.02 10*3/uL (ref 0.00–0.07)
Basophils Absolute: 0 10*3/uL (ref 0.0–0.1)
Basophils Relative: 0 %
Eosinophils Absolute: 0.2 10*3/uL (ref 0.0–0.5)
Eosinophils Relative: 2 %
HCT: 41.2 % (ref 36.0–46.0)
Hemoglobin: 13 g/dL (ref 12.0–15.0)
IMMATURE GRANULOCYTES: 0 %
Lymphocytes Relative: 20 %
Lymphs Abs: 1.4 10*3/uL (ref 0.7–4.0)
MCH: 27.3 pg (ref 26.0–34.0)
MCHC: 31.6 g/dL (ref 30.0–36.0)
MCV: 86.6 fL (ref 80.0–100.0)
Monocytes Absolute: 0.8 10*3/uL (ref 0.1–1.0)
Monocytes Relative: 12 %
NEUTROS PCT: 66 %
Neutro Abs: 4.3 10*3/uL (ref 1.7–7.7)
Platelets: 365 10*3/uL (ref 150–400)
RBC: 4.76 MIL/uL (ref 3.87–5.11)
RDW: 13.4 % (ref 11.5–15.5)
WBC: 6.7 10*3/uL (ref 4.0–10.5)
nRBC: 0 % (ref 0.0–0.2)

## 2018-10-15 LAB — IRON AND TIBC
IRON: 41 ug/dL (ref 28–170)
Saturation Ratios: 8 % — ABNORMAL LOW (ref 10.4–31.8)
TIBC: 516 ug/dL — AB (ref 250–450)
UIBC: 475 ug/dL

## 2018-10-15 LAB — FERRITIN: Ferritin: 14 ng/mL (ref 11–307)

## 2018-10-19 ENCOUNTER — Encounter: Payer: Self-pay | Admitting: Adult Health

## 2018-10-19 ENCOUNTER — Ambulatory Visit: Payer: BLUE CROSS/BLUE SHIELD | Admitting: Adult Health

## 2018-10-19 DIAGNOSIS — R7989 Other specified abnormal findings of blood chemistry: Secondary | ICD-10-CM | POA: Diagnosis not present

## 2018-10-19 DIAGNOSIS — R030 Elevated blood-pressure reading, without diagnosis of hypertension: Secondary | ICD-10-CM | POA: Diagnosis not present

## 2018-10-19 MED ORDER — PHENTERMINE HCL 37.5 MG PO CAPS: 37.5000 mg | ORAL_CAPSULE | ORAL | 0 refills | Status: DC

## 2018-10-19 NOTE — Progress Notes (Signed)
[facility] [place], [place] [address]  Internal MEDICINE  Office Visit Note  Patient Name: Jennifer Barron  [id number]  [id number]  Date of Service: 10/19/2018  Chief Complaint  Patient presents with  . Follow-up    weight management    HPI  Pt is here for follow-up on elevated ferritin as well as weight management.  Patient has gained 1 pound since her last visit however she has been working out with a trainer multiple times a week and states that she is down 2 pant sizes and feels like her clothes are fitting very differently now.  She denies any chest pain, shortness of breath, headaches or other side effects of phentermine.  She has been followed by hematology and her most recent ferritin level was normal.  She denies any other complaints at this time.      Current Medication: Outpatient Encounter Medications as of 10/19/2018  Medication Sig  . cholecalciferol (VITAMIN D) 1000 units tablet Take 1 tablet (1,000 Units total) by mouth daily.  . phentermine 37.5 MG capsule Take 1 capsule (37.5 mg total) by mouth every morning.  Marland Kitchen spironolactone (ALDACTONE) 50 MG tablet   . [DISCONTINUED] phentermine 37.5 MG capsule Take 1 capsule (37.5 mg total) by mouth every morning.  . docusate sodium (COLACE) 100 MG capsule Take 1 capsule (100 mg total) by mouth daily.   No facility-administered encounter medications on file as of 10/19/2018.     Surgical History: Past Surgical History:  Procedure Laterality Date  . abdominal laproscopy    . ADENOIDECTOMY     twice  . TONSILLECTOMY Bilateral 1987  . WISDOM TOOTH EXTRACTION  2009    Medical History: Past Medical History:  Diagnosis Date  . Endometriosis   . Migraine     Family History: Family History  Problem Relation Age of Onset  . Ovarian cancer Paternal Grandmother   . Cancer Paternal Grandmother   . Prostate cancer Paternal Grandfather   . Cancer Paternal Grandfather   . Cancer Father      Social History   Socioeconomic History  . Marital status: Married    Spouse name: Not on file  . Number of children: Not on file  . Years of education: Not on file  . Highest education level: Not on file  Occupational History  . Not on file  Social Needs  . Financial resource strain: Not on file  . Food insecurity:    Worry: Not on file    Inability: Not on file  . Transportation needs:    Medical: Not on file    Non-medical: Not on file  Tobacco Use  . Smoking status: Never Smoker  . Smokeless tobacco: Never Used  Substance and Sexual Activity  . Alcohol use: Yes    Frequency: Never    Comment: social  . Drug use: No  . Sexual activity: Yes  Lifestyle  . Physical activity:    Days per week: Not on file    Minutes per session: Not on file  . Stress: Not on file  Relationships  . Social connections:    Talks on phone: Not on file    Gets together: Not on file    Attends religious service: Not on file    Active member of club or organization: Not on file    Attends meetings of clubs or organizations: Not on file    Relationship status: Not on file  . Intimate partner violence:  Fear of current or ex partner: Not on file    Emotionally abused: Not on file    Physically abused: Not on file    Forced sexual activity: Not on file  Other Topics Concern  . Not on file  Social History Narrative  . Not on file      Review of Systems  Constitutional: Negative for chills, fatigue and unexpected weight change.  HENT: Negative for congestion, rhinorrhea, sneezing and sore throat.   Eyes: Negative for photophobia, pain and redness.  Respiratory: Negative for cough, chest tightness and shortness of breath.   Cardiovascular: Negative for chest pain and palpitations.  Gastrointestinal: Negative for abdominal pain, constipation, diarrhea, nausea and vomiting.  Endocrine: Negative.   Genitourinary: Negative for dysuria and frequency.  Musculoskeletal: Negative for  arthralgias, back pain, joint swelling and neck pain.  Skin: Negative for rash.  Allergic/Immunologic: Negative.   Neurological: Negative for tremors and numbness.  Hematological: Negative for adenopathy. Does not bruise/bleed easily.  Psychiatric/Behavioral: Negative for behavioral problems and sleep disturbance. The patient is not nervous/anxious.     Vital Signs: BP (!) 136/99   Pulse 87   Resp 16   Ht 5' 3"  (1.6 m)   Wt 164 lb (74.4 kg)   SpO2 100%   BMI 29.05 kg/m    Physical Exam Vitals signs and nursing note reviewed.  Constitutional:      General: She is not in acute distress.    Appearance: She is well-developed. She is not diaphoretic.  HENT:     Head: Normocephalic and atraumatic.     Mouth/Throat:     Pharynx: No oropharyngeal exudate.  Eyes:     Pupils: Pupils are equal, round, and reactive to light.  Neck:     Musculoskeletal: Normal range of motion and neck supple.     Thyroid: No thyromegaly.     Vascular: No JVD.     Trachea: No tracheal deviation.  Cardiovascular:     Rate and Rhythm: Normal rate and regular rhythm.     Heart sounds: Normal heart sounds. No murmur. No friction rub. No gallop.   Pulmonary:     Effort: Pulmonary effort is normal. No respiratory distress.     Breath sounds: Normal breath sounds. No wheezing or rales.  Chest:     Chest wall: No tenderness.  Abdominal:     Palpations: Abdomen is soft.     Tenderness: There is no abdominal tenderness. There is no guarding.  Musculoskeletal: Normal range of motion.  Lymphadenopathy:     Cervical: No cervical adenopathy.  Skin:    General: Skin is warm and dry.  Neurological:     Mental Status: She is alert and oriented to person, place, and time.     Cranial Nerves: No cranial nerve deficit.  Psychiatric:        Behavior: Behavior normal.        Thought Content: Thought content normal.        Judgment: Judgment normal.    Assessment/Plan: 1. Morbid obesity ([place]) Patient's  phentermine need for another month.  She is encouraged to continue working out and working on her diet - phentermine 37.5 MG capsule; Take 1 capsule (37.5 mg total) by mouth every morning.  Dispense: 30 capsule; Refill: 0 Obesity Counseling: Risk Assessment: An assessment of behavioral risk factors was made today and includes lack of exercise sedentary lifestyle, lack of portion control and poor dietary habits.  Risk Modification Advice: She was counseled on portion  control guidelines. Restricting daily caloric intake to. . The detrimental long term effects of obesity on her health and ongoing poor compliance was also discussed with the patient.  2. Hereditary hemochromatosis ([place]) Stable, pt continues to see  3. Elevated ferritin Stable, continue to see hematology.   4. Elevated blood pressure reading Patient's blood pressure initially elevated at today's visit. Repeated and found to be 138/88.  Will continue to monitor in the future.   General Counseling: Jennifer Barron verbalizes understanding of the findings of todays visit and agrees with plan of treatment. I have discussed any further diagnostic evaluation that may be needed or ordered today. We also reviewed her medications today. she has been encouraged to call the office with any questions or concerns that should arise related to todays visit.    No orders of the defined types were placed in this encounter.   Meds ordered this encounter  Medications  . phentermine 37.5 MG capsule    Sig: Take 1 capsule (37.5 mg total) by mouth every morning.    Dispense:  30 capsule    Refill:  0    Please use tablets instead of capsules.    Time spent: 25 Minutes   This patient was seen by Orson Gear AGNP-C in Collaboration with Dr Lavera Guise as a part of collaborative care agreement     Kendell Bane AGNP-C Internal medicine

## 2018-10-19 NOTE — Patient Instructions (Signed)
Phentermine tablets or capsules What is this medicine? PHENTERMINE (FEN ter meen) decreases your appetite. It is used with a reduced calorie diet and exercise to help you lose weight. This medicine may be used for other purposes; ask your health care provider or pharmacist if you have questions. COMMON BRAND NAME(S): Adipex-P, Atti-Plex P, Atti-Plex P Spansule, Fastin, Lomaira, Pro-Fast, Tara-8 What should I tell my health care provider before I take this medicine? They need to know if you have any of these conditions: -agitation or nervousness -diabetes -glaucoma -heart disease -high blood pressure -history of drug abuse or addiction -history of stroke -kidney disease -lung disease called Primary Pulmonary Hypertension (PPH) -taken an MAOI like Carbex, Eldepryl, Marplan, Nardil, or Parnate in last 14 days -taking stimulant medicines for attention disorders, weight loss, or to stay awake -thyroid disease -an unusual or allergic reaction to phentermine, other medicines, foods, dyes, or preservatives -pregnant or trying to get pregnant -breast-feeding How should I use this medicine? Take this medicine by mouth with a glass of water. Follow the directions on the prescription label. The instructions for use may differ based on the product and dose you are taking. Avoid taking this medicine in the evening. It may interfere with sleep. Take your doses at regular intervals. Do not take your medicine more often than directed. Talk to your pediatrician regarding the use of this medicine in children. While this drug may be prescribed for children 17 years or older for selected conditions, precautions do apply. Overdosage: If you think you have taken too much of this medicine contact a poison control center or emergency room at once. NOTE: This medicine is only for you. Do not share this medicine with others. What if I miss a dose? If you miss a dose, take it as soon as you can. If it is almost time  for your next dose, take only that dose. Do not take double or extra doses. What may interact with this medicine? Do not take this medicine with any of the following medications: -MAOIs like Carbex, Eldepryl, Marplan, Nardil, and Parnate -medicines for colds or breathing difficulties like pseudoephedrine or phenylephrine -procarbazine -sibutramine -stimulant medicines for attention disorders, weight loss, or to stay awake This medicine may also interact with the following medications: -certain medicines for depression, anxiety, or psychotic disturbances -linezolid -medicines for diabetes -medicines for high blood pressure This list may not describe all possible interactions. Give your health care provider a list of all the medicines, herbs, non-prescription drugs, or dietary supplements you use. Also tell them if you smoke, drink alcohol, or use illegal drugs. Some items may interact with your medicine. What should I watch for while using this medicine? Notify your physician immediately if you become short of breath while doing your normal activities. Do not take this medicine within 6 hours of bedtime. It can keep you from getting to sleep. Avoid drinks that contain caffeine and try to stick to a regular bedtime every night. This medicine was intended to be used in addition to a healthy diet and exercise. The best results are achieved this way. This medicine is only indicated for short-term use. Eventually your weight loss may level out. At that point, the drug will only help you maintain your new weight. Do not increase or in any way change your dose without consulting your doctor. You may get drowsy or dizzy. Do not drive, use machinery, or do anything that needs mental alertness until you know how this medicine affects you. Do  not stand or sit up quickly, especially if you are an older patient. This reduces the risk of dizzy or fainting spells. Alcohol may increase dizziness and drowsiness.  Avoid alcoholic drinks. What side effects may I notice from receiving this medicine? Side effects that you should report to your doctor or health care professional as soon as possible: -allergic reactions like skin rash, itching or hives, swelling of the face, lips, or tongue) -anxiety -breathing problems -changes in vision -chest pain or chest tightness -depressed mood or other mood changes -hallucinations, loss of contact with reality -fast, irregular heartbeat -increased blood pressure -irritable -nervousness or restlessness -painful urination -palpitations -tremors -trouble sleeping -seizures -signs and symptoms of a stroke like changes in vision; confusion; trouble speaking or understanding; severe headaches; sudden numbness or weakness of the face, arm or leg; trouble walking; dizziness; loss of balance or coordination -unusually weak or tired -vomiting Side effects that usually do not require medical attention (report to your doctor or health care professional if they continue or are bothersome): -constipation or diarrhea -dry mouth -headache -nausea -stomach upset -sweating This list may not describe all possible side effects. Call your doctor for medical advice about side effects. You may report side effects to FDA at 1-800-FDA-1088. Where should I keep my medicine? Keep out of the reach of children. This medicine can be abused. Keep your medicine in a safe place to protect it from theft. Do not share this medicine with anyone. Selling or giving away this medicine is dangerous and against the law. This medicine may cause accidental overdose and death if taken by other adults, children, or pets. Mix any unused medicine with a substance like cat litter or coffee grounds. Then throw the medicine away in a sealed container like a sealed bag or a coffee can with a lid. Do not use the medicine after the expiration date. Store at room temperature between 20 and 25 degrees C (68 and  77 degrees F). Keep container tightly closed. NOTE: This sheet is a summary. It may not cover all possible information. If you have questions about this medicine, talk to your doctor, pharmacist, or health care provider.  2019 Elsevier/Gold Standard (2017-03-24 08:23:13)

## 2018-11-09 ENCOUNTER — Inpatient Hospital Stay: Payer: BLUE CROSS/BLUE SHIELD | Attending: Oncology

## 2018-11-09 DIAGNOSIS — R5383 Other fatigue: Secondary | ICD-10-CM | POA: Insufficient documentation

## 2018-11-09 DIAGNOSIS — Z79899 Other long term (current) drug therapy: Secondary | ICD-10-CM | POA: Insufficient documentation

## 2018-11-09 LAB — CBC WITH DIFFERENTIAL/PLATELET
Abs Immature Granulocytes: 0.02 10*3/uL (ref 0.00–0.07)
Basophils Absolute: 0 10*3/uL (ref 0.0–0.1)
Basophils Relative: 0 %
EOS ABS: 0.1 10*3/uL (ref 0.0–0.5)
Eosinophils Relative: 1 %
HCT: 41 % (ref 36.0–46.0)
Hemoglobin: 13.1 g/dL (ref 12.0–15.0)
Immature Granulocytes: 0 %
Lymphocytes Relative: 19 %
Lymphs Abs: 1.5 10*3/uL (ref 0.7–4.0)
MCH: 27.5 pg (ref 26.0–34.0)
MCHC: 32 g/dL (ref 30.0–36.0)
MCV: 86.1 fL (ref 80.0–100.0)
Monocytes Absolute: 0.7 10*3/uL (ref 0.1–1.0)
Monocytes Relative: 9 %
Neutro Abs: 5.6 10*3/uL (ref 1.7–7.7)
Neutrophils Relative %: 71 %
Platelets: 313 10*3/uL (ref 150–400)
RBC: 4.76 MIL/uL (ref 3.87–5.11)
RDW: 14.6 % (ref 11.5–15.5)
WBC: 7.9 10*3/uL (ref 4.0–10.5)
nRBC: 0 % (ref 0.0–0.2)

## 2018-11-09 LAB — IRON AND TIBC
Iron: 268 ug/dL — ABNORMAL HIGH (ref 28–170)
Saturation Ratios: 50 % — ABNORMAL HIGH (ref 10.4–31.8)
TIBC: 541 ug/dL — ABNORMAL HIGH (ref 250–450)
UIBC: 273 ug/dL

## 2018-11-09 LAB — FERRITIN: FERRITIN: 25 ng/mL (ref 11–307)

## 2018-11-12 ENCOUNTER — Encounter: Payer: Self-pay | Admitting: Oncology

## 2018-11-12 ENCOUNTER — Other Ambulatory Visit: Payer: Self-pay

## 2018-11-12 ENCOUNTER — Inpatient Hospital Stay: Payer: BLUE CROSS/BLUE SHIELD

## 2018-11-12 ENCOUNTER — Inpatient Hospital Stay (HOSPITAL_BASED_OUTPATIENT_CLINIC_OR_DEPARTMENT_OTHER): Payer: BLUE CROSS/BLUE SHIELD | Admitting: Oncology

## 2018-11-12 DIAGNOSIS — R5383 Other fatigue: Secondary | ICD-10-CM | POA: Diagnosis not present

## 2018-11-12 DIAGNOSIS — Z79899 Other long term (current) drug therapy: Secondary | ICD-10-CM | POA: Diagnosis not present

## 2018-11-12 NOTE — Progress Notes (Signed)
Patient here today for follow up.  Patient states no new concerns today  

## 2018-11-12 NOTE — Progress Notes (Signed)
Hematology/Oncology Follow up note [facility] [phone] [phone]   Patient Care Team: Lavera Guise, MD as PCP - General (Internal Medicine)  REFERRING PROVIDER: Lavera Guise, MD REASON FOR VISIT Follow up for treatment of iron overload and hemochromatosis.    HISTORY OF PRESENTING ILLNESS:  Jennifer Barron is a  38 y.o.  female with PMH listed below who was referred to me for evaluation of elevated ferritin. Patient follows up with GYN for IUD placement, and was fine to have abnormal liver function test, mainly elevated transaminitis.  Patient went to primary care physician Dr. Humphrey Rolls And had additional testing.  She was found to have elevated ferritin level at 547, iron saturation 33, ultrasound of abdomen was done on January 12, 2018, which showed upper normal limits spleen size and increased echogenicity of liver questionable fatty liver. Patient was referred to [facility] for further evaluation of elevated ferritin. Currently patient takes spironolactone for acne and this was started by dermatologist a week ago.  She also takes phentermine for obesity, recently started. Patient reports feeling tired fatigue.  Denies any joint pain shortness of breath, lower extremity swelling.  She denies any family history of hemochromatosis.  INTERVAL HISTORY Jennifer Barron is a 38 y.o. female who has above history reviewed by me today presents for follow-up visit for management of iron overload and hemochromatosis. Patient reports feeling well at baseline.  Fatigue has gotten better after phlebotomy.  Joint pain has also improved.  Recently fatigue appears to be a little worsened.  Appetite is fair.  No new complaints.    Review of Systems  Constitutional: Positive for malaise/fatigue. Negative for chills, fever and weight loss.  HENT: Negative for sore throat.   Eyes: Negative for redness.  Respiratory: Negative for cough, shortness of breath  and wheezing.   Cardiovascular: Negative for chest pain, palpitations and leg swelling.  Gastrointestinal: Negative for abdominal pain, blood in stool, nausea and vomiting.  Genitourinary: Negative for dysuria.  Musculoskeletal: Negative for myalgias.  Skin: Negative for rash.  Neurological: Negative for dizziness, tingling and tremors.  Endo/Heme/Allergies: Does not bruise/bleed easily.  Psychiatric/Behavioral: Negative for hallucinations.    MEDICAL HISTORY:  Past Medical History:  Diagnosis Date  . Endometriosis   . Migraine     SURGICAL HISTORY: Past Surgical History:  Procedure Laterality Date  . abdominal laproscopy    . ADENOIDECTOMY     twice  . TONSILLECTOMY Bilateral 1987  . WISDOM TOOTH EXTRACTION  2009    SOCIAL HISTORY: Social History   Socioeconomic History  . Marital status: Married    Spouse name: Not on file  . Number of children: Not on file  . Years of education: Not on file  . Highest education level: Not on file  Occupational History  . Not on file  Social Needs  . Financial resource strain: Not on file  . Food insecurity:    Worry: Not on file    Inability: Not on file  . Transportation needs:    Medical: Not on file    Non-medical: Not on file  Tobacco Use  . Smoking status: Never Smoker  . Smokeless tobacco: Never Used  Substance and Sexual Activity  . Alcohol use: Yes    Frequency: Never    Comment: social  . Drug use: No  . Sexual activity: Yes  Lifestyle  . Physical activity:    Days per week: Not on file    Minutes per session: Not  on file  . Stress: Not on file  Relationships  . Social connections:    Talks on phone: Not on file    Gets together: Not on file    Attends religious service: Not on file    Active member of club or organization: Not on file    Attends meetings of clubs or organizations: Not on file    Relationship status: Not on file  . Intimate partner violence:    Fear of current or ex partner: Not on  file    Emotionally abused: Not on file    Physically abused: Not on file    Forced sexual activity: Not on file  Other Topics Concern  . Not on file  Social History Narrative  . Not on file    FAMILY HISTORY: Family History  Problem Relation Age of Onset  . Ovarian cancer Paternal Grandmother   . Cancer Paternal Grandmother   . Prostate cancer Paternal Grandfather   . Cancer Paternal Grandfather   . Cancer Father     ALLERGIES:  has No Known Allergies.  MEDICATIONS:  Current Outpatient Medications  Medication Sig Dispense Refill  . cholecalciferol (VITAMIN D) 1000 units tablet Take 1 tablet (1,000 Units total) by mouth daily. 90 tablet 0  . docusate sodium (COLACE) 100 MG capsule Take 1 capsule (100 mg total) by mouth daily. 30 capsule 0  . phentermine 37.5 MG capsule Take 1 capsule (37.5 mg total) by mouth every morning. 30 capsule 0  . spironolactone (ALDACTONE) 50 MG tablet   4   No current facility-administered medications for this visit.      PHYSICAL EXAMINATION: ECOG PERFORMANCE STATUS: 1 - Symptomatic but completely ambulatory There were no vitals filed for this visit. There were no vitals filed for this visit.  Physical Exam Constitutional:      General: She is not in acute distress. HENT:     Head: Normocephalic and atraumatic.  Eyes:     General: No scleral icterus.    Conjunctiva/sclera: Conjunctivae normal.     Pupils: Pupils are equal, round, and reactive to light.  Neck:     Musculoskeletal: Normal range of motion and neck supple.  Cardiovascular:     Rate and Rhythm: Normal rate and regular rhythm.     Heart sounds: Normal heart sounds. No murmur. No friction rub. No gallop.   Pulmonary:     Effort: Pulmonary effort is normal. No respiratory distress.     Breath sounds: Normal breath sounds. No wheezing or rales.  Chest:     Chest wall: No tenderness.  Abdominal:     General: Bowel sounds are normal. There is no distension.     Palpations:  Abdomen is soft. There is no mass.     Tenderness: There is no abdominal tenderness.  Musculoskeletal: Normal range of motion.        General: No deformity.  Lymphadenopathy:     Cervical: No cervical adenopathy.  Skin:    General: Skin is warm and dry.     Findings: No erythema or rash.  Neurological:     Mental Status: She is alert and oriented to person, place, and time.     Cranial Nerves: No cranial nerve deficit.     Coordination: Coordination normal.     Deep Tendon Reflexes: Reflexes normal.  Psychiatric:        Behavior: Behavior normal.        Thought Content: Thought content normal.  LABORATORY DATA:  I have reviewed the data as listed Lab Results  Component Value Date   WBC 7.9 11/09/2018   HGB 13.1 11/09/2018   HCT 41.0 11/09/2018   MCV 86.1 11/09/2018   PLT 313 11/09/2018   Recent Labs    02/08/18 1550 02/13/18 0940 03/27/18 1105 06/07/18 0828 09/14/18 0811  NA 128* 136  --  138 132*  K 4.0 4.4  --  4.1 5.2*  CL 96* 101  --  103 98  CO2 24 26  --  24 25  GLUCOSE 91 110*  --  104* 107*  BUN 11 10  --  11 12  CREATININE 0.88 0.77  --  0.86 0.91  CALCIUM 9.6 10.0  --  9.6 10.3  GFRNONAA >60 >60  --  >60 >60  GFRAA >60 >60  --  >60 >60  PROT 7.5  --  7.7 7.4 7.9  ALBUMIN 4.5  --  4.6 4.4 4.7  AST 141*  --  139* 113* 65*  ALT 238*  --  179* 153* 88*  ALKPHOS 48  --  46 52 53  BILITOT 2.1*  --  1.5* 0.9 1.0  BILIDIR 0.3  --  0.2  --   --   IBILI  --   --  1.3*  --   --     Hepatitis B surface antigen negative, hepatitis C RNA nondetectable.  01/13/2108 US abdomen showed upper normal limits spleen size and increased echogenicity of liver questionable fatty liver.  ASSESSMENT & PLAN:  1. Hereditary hemochromatosis ([place])   2. Iron overload   3. Other fatigue    #Heterozygous H63D hemochromatosis/iron overload/transaminitis Labs reviewed and discussed with patient.  Iron panel showed mixed picture. Ferritin is 25, consistent with high TIBC.   However iron saturation elevated at 50. I recommend holding additional phlebotomy at this point.  Ferritin is at goal. Repeat iron, TIBC, ferritin in 4 weeks.  #Patient can return to clinic for follow-up evaluation in 3 months.  She will need to have CBC, iron, TIBC, ferritin, CMP done prior to the visit.  Possible phlebotomy.  All questions were answered. The patient knows to call the clinic with any problems questions or concerns.  Return of visit: Repeat  iron TIBC ferritin in 4 weeks. Follow-up in clinic in 3 months repeat iron TIBC ferritin and CBC, CMP.  Orders Placed This Encounter  Procedures  . Comprehensive metabolic panel    Standing Status:   Future    Standing Expiration Date:   11/13/2019     Earlie Server, MD, PhD Hematology Oncology [facility] Pager- [phone] 11/12/2018

## 2018-11-21 ENCOUNTER — Ambulatory Visit: Payer: BC Managed Care – PPO | Admitting: Adult Health

## 2018-11-21 ENCOUNTER — Encounter: Payer: Self-pay | Admitting: Adult Health

## 2018-11-21 VITALS — BP 120/80 | HR 86 | Resp 16 | Ht 63.0 in | Wt 169.8 lb

## 2018-11-21 DIAGNOSIS — R5383 Other fatigue: Secondary | ICD-10-CM | POA: Diagnosis not present

## 2018-11-21 NOTE — Patient Instructions (Signed)

## 2018-11-21 NOTE — Progress Notes (Signed)
[facility] [place], [place] [address]  Internal MEDICINE  Office Visit Note  Patient Name: Jennifer Barron  [id number]  [id number]  Date of Service: 12/02/2018  Chief Complaint  Patient presents with  . Medical Management of Chronic Issues    Weight Management    HPI Patient is here for follow-up on medical management of weight loss.  She reports that she has not taken the phentermine since her our last visit due to a death in her family as well as some ongoing fatigue.  She has gained a few pounds since 1 month ago when we saw her last.  She is concerned and would like some lab work done at this time.  She is ready to start the phentermine that we gave her last time now that she is getting over the death in her family.    Current Medication: Outpatient Encounter Medications as of 11/21/2018  Medication Sig  . cholecalciferol (VITAMIN D) 1000 units tablet Take 1 tablet (1,000 Units total) by mouth daily.  . phentermine 37.5 MG capsule Take 1 capsule (37.5 mg total) by mouth every morning.  Marland Kitchen spironolactone (ALDACTONE) 50 MG tablet   . docusate sodium (COLACE) 100 MG capsule Take 1 capsule (100 mg total) by mouth daily.   No facility-administered encounter medications on file as of 11/21/2018.     Surgical History: Past Surgical History:  Procedure Laterality Date  . abdominal laproscopy    . ADENOIDECTOMY     twice  . TONSILLECTOMY Bilateral 1987  . WISDOM TOOTH EXTRACTION  2009    Medical History: Past Medical History:  Diagnosis Date  . Endometriosis   . Migraine     Family History: Family History  Problem Relation Age of Onset  . Ovarian cancer Paternal Grandmother   . Cancer Paternal Grandmother   . Prostate cancer Paternal Grandfather   . Cancer Paternal Grandfather   . Cancer Father     Social History   Socioeconomic History  . Marital status: Married    Spouse name: Not on file  . Number of children: Not on file  .  Years of education: Not on file  . Highest education level: Not on file  Occupational History  . Not on file  Social Needs  . Financial resource strain: Not on file  . Food insecurity:    Worry: Not on file    Inability: Not on file  . Transportation needs:    Medical: Not on file    Non-medical: Not on file  Tobacco Use  . Smoking status: Never Smoker  . Smokeless tobacco: Never Used  Substance and Sexual Activity  . Alcohol use: Yes    Frequency: Never    Comment: social  . Drug use: No  . Sexual activity: Yes  Lifestyle  . Physical activity:    Days per week: Not on file    Minutes per session: Not on file  . Stress: Not on file  Relationships  . Social connections:    Talks on phone: Not on file    Gets together: Not on file    Attends religious service: Not on file    Active member of club or organization: Not on file    Attends meetings of clubs or organizations: Not on file    Relationship status: Not on file  . Intimate partner violence:    Fear of current or ex partner: Not on file    Emotionally abused: Not on file  Physically abused: Not on file    Forced sexual activity: Not on file  Other Topics Concern  . Not on file  Social History Narrative  . Not on file      Review of Systems  Constitutional: Negative for chills, fatigue and unexpected weight change.  HENT: Negative for congestion, rhinorrhea, sneezing and sore throat.   Eyes: Negative for photophobia, pain and redness.  Respiratory: Negative for cough, chest tightness and shortness of breath.   Cardiovascular: Negative for chest pain and palpitations.  Gastrointestinal: Negative for abdominal pain, constipation, diarrhea, nausea and vomiting.  Endocrine: Negative.   Genitourinary: Negative for dysuria and frequency.  Musculoskeletal: Negative for arthralgias, back pain, joint swelling and neck pain.  Skin: Negative for rash.  Allergic/Immunologic: Negative.   Neurological: Negative for  tremors and numbness.  Hematological: Negative for adenopathy. Does not bruise/bleed easily.  Psychiatric/Behavioral: Negative for behavioral problems and sleep disturbance. The patient is not nervous/anxious.     Vital Signs: BP 120/80   Pulse 86   Resp 16   Ht 5' 3"  (1.6 m)   Wt 169 lb 12.8 oz (77 kg)   SpO2 98%   BMI 30.08 kg/m    Physical Exam Vitals signs and nursing note reviewed.  Constitutional:      General: She is not in acute distress.    Appearance: She is well-developed. She is not diaphoretic.  HENT:     Head: Normocephalic and atraumatic.     Mouth/Throat:     Pharynx: No oropharyngeal exudate.  Eyes:     Pupils: Pupils are equal, round, and reactive to light.  Neck:     Musculoskeletal: Normal range of motion and neck supple.     Thyroid: No thyromegaly.     Vascular: No JVD.     Trachea: No tracheal deviation.  Cardiovascular:     Rate and Rhythm: Normal rate and regular rhythm.     Heart sounds: Normal heart sounds. No murmur. No friction rub. No gallop.   Pulmonary:     Effort: Pulmonary effort is normal. No respiratory distress.     Breath sounds: Normal breath sounds. No wheezing or rales.  Chest:     Chest wall: No tenderness.  Abdominal:     Palpations: Abdomen is soft.     Tenderness: There is no abdominal tenderness. There is no guarding.  Musculoskeletal: Normal range of motion.  Lymphadenopathy:     Cervical: No cervical adenopathy.  Skin:    General: Skin is warm and dry.  Neurological:     Mental Status: She is alert and oriented to person, place, and time.     Cranial Nerves: No cranial nerve deficit.  Psychiatric:        Behavior: Behavior normal.        Thought Content: Thought content normal.        Judgment: Judgment normal.    Assessment/Plan: 1. Fatigue, unspecified type Draw labs for patient's B12 light and vitamin D.  She has hereditary hemochromatosis, and these fatigue could be related to that.  She currently sees  oncology hematology. - B12 and Folate Panel - Vitamin D 1,25 dihydroxy  2. Morbid obesity ([place]) Again discussed risk and benefit of phentermine.  Patient verbalized understanding and she will start her previous prescription at this time.  Will follow with her in 1 month. Obesity Counseling: Risk Assessment: An assessment of behavioral risk factors was made today and includes lack of exercise sedentary lifestyle, lack of portion control  and poor dietary habits.  Risk Modification Advice: She was counseled on portion control guidelines. Restricting daily caloric intake to. . The detrimental long term effects of obesity on her health and ongoing poor compliance was also discussed with the patient.  3. Hereditary hemochromatosis ([place]) Continue to be followed by hematology oncology at this time.  General Counseling: Kiyra verbalizes understanding of the findings of todays visit and agrees with plan of treatment. I have discussed any further diagnostic evaluation that may be needed or ordered today. We also reviewed her medications today. she has been encouraged to call the office with any questions or concerns that should arise related to todays visit.    Orders Placed This Encounter  Procedures  . B12 and Folate Panel  . Vitamin D 1,25 dihydroxy    No orders of the defined types were placed in this encounter.   Time spent: 25 Minutes   This patient was seen by Orson Gear AGNP-C in Collaboration with Dr Lavera Guise as a part of collaborative care agreement     Kendell Bane AGNP-C Internal medicine

## 2018-12-10 ENCOUNTER — Inpatient Hospital Stay: Payer: BLUE CROSS/BLUE SHIELD | Attending: Oncology

## 2018-12-10 LAB — IRON AND TIBC
Iron: 74 ug/dL (ref 28–170)
Saturation Ratios: 15 % (ref 10.4–31.8)
TIBC: 493 ug/dL — ABNORMAL HIGH (ref 250–450)
UIBC: 419 ug/dL

## 2018-12-10 LAB — CBC WITH DIFFERENTIAL/PLATELET
Abs Immature Granulocytes: 0.02 10*3/uL (ref 0.00–0.07)
Basophils Absolute: 0 10*3/uL (ref 0.0–0.1)
Basophils Relative: 0 %
Eosinophils Absolute: 0.1 10*3/uL (ref 0.0–0.5)
Eosinophils Relative: 1 %
HEMATOCRIT: 41.4 % (ref 36.0–46.0)
Hemoglobin: 13.8 g/dL (ref 12.0–15.0)
Immature Granulocytes: 0 %
Lymphocytes Relative: 21 %
Lymphs Abs: 1.4 10*3/uL (ref 0.7–4.0)
MCH: 28.9 pg (ref 26.0–34.0)
MCHC: 33.3 g/dL (ref 30.0–36.0)
MCV: 86.8 fL (ref 80.0–100.0)
Monocytes Absolute: 0.5 10*3/uL (ref 0.1–1.0)
Monocytes Relative: 8 %
Neutro Abs: 4.4 10*3/uL (ref 1.7–7.7)
Neutrophils Relative %: 70 %
Platelets: 290 10*3/uL (ref 150–400)
RBC: 4.77 MIL/uL (ref 3.87–5.11)
RDW: 15.7 % — ABNORMAL HIGH (ref 11.5–15.5)
WBC: 6.3 10*3/uL (ref 4.0–10.5)
nRBC: 0 % (ref 0.0–0.2)

## 2018-12-10 LAB — FERRITIN: Ferritin: 20 ng/mL (ref 11–307)

## 2018-12-21 ENCOUNTER — Ambulatory Visit: Payer: BC Managed Care – PPO | Admitting: Adult Health

## 2018-12-21 ENCOUNTER — Encounter: Payer: Self-pay | Admitting: Adult Health

## 2018-12-21 VITALS — BP 132/92 | HR 92 | Resp 16 | Ht 63.0 in | Wt 161.0 lb

## 2018-12-21 DIAGNOSIS — J0110 Acute frontal sinusitis, unspecified: Secondary | ICD-10-CM | POA: Diagnosis not present

## 2018-12-21 DIAGNOSIS — R030 Elevated blood-pressure reading, without diagnosis of hypertension: Secondary | ICD-10-CM

## 2018-12-21 DIAGNOSIS — T7840XA Allergy, unspecified, initial encounter: Secondary | ICD-10-CM

## 2018-12-21 MED ORDER — MONTELUKAST SODIUM 10 MG PO TABS: 10.0000 mg | ORAL_TABLET | Freq: Every day | ORAL | 3 refills | Status: DC

## 2018-12-21 MED ORDER — AMOXICILLIN-POT CLAVULANATE 875-125 MG PO TABS: 1.0000 | ORAL_TABLET | Freq: Two times a day (BID) | ORAL | 0 refills | Status: DC

## 2018-12-21 MED ORDER — PHENTERMINE HCL 37.5 MG PO CAPS: 37.5000 mg | ORAL_CAPSULE | ORAL | 0 refills | Status: DC

## 2018-12-21 NOTE — Patient Instructions (Signed)
Phentermine tablets or capsules What is this medicine? PHENTERMINE (FEN ter meen) decreases your appetite. It is used with a reduced calorie diet and exercise to help you lose weight. This medicine may be used for other purposes; ask your health care provider or pharmacist if you have questions. COMMON BRAND NAME(S): Adipex-P, Atti-Plex P, Atti-Plex P Spansule, Fastin, Lomaira, Pro-Fast, Tara-8 What should I tell my health care provider before I take this medicine? They need to know if you have any of these conditions: -agitation or nervousness -diabetes -glaucoma -heart disease -high blood pressure -history of drug abuse or addiction -history of stroke -kidney disease -lung disease called Primary Pulmonary Hypertension (PPH) -taken an MAOI like Carbex, Eldepryl, Marplan, Nardil, or Parnate in last 14 days -taking stimulant medicines for attention disorders, weight loss, or to stay awake -thyroid disease -an unusual or allergic reaction to phentermine, other medicines, foods, dyes, or preservatives -pregnant or trying to get pregnant -breast-feeding How should I use this medicine? Take this medicine by mouth with a glass of water. Follow the directions on the prescription label. The instructions for use may differ based on the product and dose you are taking. Avoid taking this medicine in the evening. It may interfere with sleep. Take your doses at regular intervals. Do not take your medicine more often than directed. Talk to your pediatrician regarding the use of this medicine in children. While this drug may be prescribed for children 17 years or older for selected conditions, precautions do apply. Overdosage: If you think you have taken too much of this medicine contact a poison control center or emergency room at once. NOTE: This medicine is only for you. Do not share this medicine with others. What if I miss a dose? If you miss a dose, take it as soon as you can. If it is almost time  for your next dose, take only that dose. Do not take double or extra doses. What may interact with this medicine? Do not take this medicine with any of the following medications: -MAOIs like Carbex, Eldepryl, Marplan, Nardil, and Parnate -medicines for colds or breathing difficulties like pseudoephedrine or phenylephrine -procarbazine -sibutramine -stimulant medicines for attention disorders, weight loss, or to stay awake This medicine may also interact with the following medications: -certain medicines for depression, anxiety, or psychotic disturbances -linezolid -medicines for diabetes -medicines for high blood pressure This list may not describe all possible interactions. Give your health care provider a list of all the medicines, herbs, non-prescription drugs, or dietary supplements you use. Also tell them if you smoke, drink alcohol, or use illegal drugs. Some items may interact with your medicine. What should I watch for while using this medicine? Notify your physician immediately if you become short of breath while doing your normal activities. Do not take this medicine within 6 hours of bedtime. It can keep you from getting to sleep. Avoid drinks that contain caffeine and try to stick to a regular bedtime every night. This medicine was intended to be used in addition to a healthy diet and exercise. The best results are achieved this way. This medicine is only indicated for short-term use. Eventually your weight loss may level out. At that point, the drug will only help you maintain your new weight. Do not increase or in any way change your dose without consulting your doctor. You may get drowsy or dizzy. Do not drive, use machinery, or do anything that needs mental alertness until you know how this medicine affects you. Do  not stand or sit up quickly, especially if you are an older patient. This reduces the risk of dizzy or fainting spells. Alcohol may increase dizziness and drowsiness.  Avoid alcoholic drinks. What side effects may I notice from receiving this medicine? Side effects that you should report to your doctor or health care professional as soon as possible: -allergic reactions like skin rash, itching or hives, swelling of the face, lips, or tongue) -anxiety -breathing problems -changes in vision -chest pain or chest tightness -depressed mood or other mood changes -hallucinations, loss of contact with reality -fast, irregular heartbeat -increased blood pressure -irritable -nervousness or restlessness -painful urination -palpitations -tremors -trouble sleeping -seizures -signs and symptoms of a stroke like changes in vision; confusion; trouble speaking or understanding; severe headaches; sudden numbness or weakness of the face, arm or leg; trouble walking; dizziness; loss of balance or coordination -unusually weak or tired -vomiting Side effects that usually do not require medical attention (report to your doctor or health care professional if they continue or are bothersome): -constipation or diarrhea -dry mouth -headache -nausea -stomach upset -sweating This list may not describe all possible side effects. Call your doctor for medical advice about side effects. You may report side effects to FDA at 1-800-FDA-1088. Where should I keep my medicine? Keep out of the reach of children. This medicine can be abused. Keep your medicine in a safe place to protect it from theft. Do not share this medicine with anyone. Selling or giving away this medicine is dangerous and against the law. This medicine may cause accidental overdose and death if taken by other adults, children, or pets. Mix any unused medicine with a substance like cat litter or coffee grounds. Then throw the medicine away in a sealed container like a sealed bag or a coffee can with a lid. Do not use the medicine after the expiration date. Store at room temperature between 20 and 25 degrees C (68 and  77 degrees F). Keep container tightly closed. NOTE: This sheet is a summary. It may not cover all possible information. If you have questions about this medicine, talk to your doctor, pharmacist, or health care provider.  2019 Elsevier/Gold Standard (2017-03-24 08:23:13)

## 2018-12-21 NOTE — Progress Notes (Signed)
[facility] [place], [place] [address]  Internal MEDICINE  Office Visit Note  Patient Name: Jennifer Barron  [id number]  [id number]  Date of Service: 12/21/2018  Chief Complaint  Patient presents with  . Medical Management of Chronic Issues    weight loss management     HPI Pt here for follow up on weight loss management. Patient is using phentermine for weight loss.  Since our last visit they have lost 8 pounds.  The patient denies any chest pain, palpitations, shortness of breath, constipation, headaches or any other side effects of the medication.  Patient wishes to continue to use this medication for weight loss at this time.  She is also complaining of increased allergy symptoms as well as sinus pain and pressure, postnasal drip and ear pressure.  Patient reports the symptoms have been lingering for a few weeks.  She reports that she did feel feverish with chills on 2 occasions in the last 5 days.      Current Medication: Outpatient Encounter Medications as of 12/21/2018  Medication Sig  . cholecalciferol (VITAMIN D) 1000 units tablet Take 1 tablet (1,000 Units total) by mouth daily.  . phentermine 37.5 MG capsule Take 1 capsule (37.5 mg total) by mouth every morning.  Marland Kitchen spironolactone (ALDACTONE) 50 MG tablet   . [DISCONTINUED] phentermine 37.5 MG capsule Take 1 capsule (37.5 mg total) by mouth every morning.  Marland Kitchen amoxicillin-clavulanate (AUGMENTIN) 875-125 MG tablet Take 1 tablet by mouth 2 (two) times daily.  Marland Kitchen docusate sodium (COLACE) 100 MG capsule Take 1 capsule (100 mg total) by mouth daily.  . montelukast (SINGULAIR) 10 MG tablet Take 1 tablet (10 mg total) by mouth at bedtime.   No facility-administered encounter medications on file as of 12/21/2018.     Surgical History: Past Surgical History:  Procedure Laterality Date  . abdominal laproscopy    . ADENOIDECTOMY     twice  . TONSILLECTOMY Bilateral 1987  . WISDOM TOOTH EXTRACTION  2009     Medical History: Past Medical History:  Diagnosis Date  . Endometriosis   . Migraine     Family History: Family History  Problem Relation Age of Onset  . Ovarian cancer Paternal Grandmother   . Cancer Paternal Grandmother   . Prostate cancer Paternal Grandfather   . Cancer Paternal Grandfather   . Cancer Father     Social History   Socioeconomic History  . Marital status: Married    Spouse name: Not on file  . Number of children: Not on file  . Years of education: Not on file  . Highest education level: Not on file  Occupational History  . Not on file  Social Needs  . Financial resource strain: Not on file  . Food insecurity:    Worry: Not on file    Inability: Not on file  . Transportation needs:    Medical: Not on file    Non-medical: Not on file  Tobacco Use  . Smoking status: Never Smoker  . Smokeless tobacco: Never Used  Substance and Sexual Activity  . Alcohol use: Yes    Frequency: Never    Comment: social  . Drug use: No  . Sexual activity: Yes  Lifestyle  . Physical activity:    Days per week: Not on file    Minutes per session: Not on file  . Stress: Not on file  Relationships  . Social connections:    Talks on phone: Not on file  Gets together: Not on file    Attends religious service: Not on file    Active member of club or organization: Not on file    Attends meetings of clubs or organizations: Not on file    Relationship status: Not on file  . Intimate partner violence:    Fear of current or ex partner: Not on file    Emotionally abused: Not on file    Physically abused: Not on file    Forced sexual activity: Not on file  Other Topics Concern  . Not on file  Social History Narrative  . Not on file      Review of Systems  Constitutional: Negative for chills, fatigue and unexpected weight change.  HENT: Negative for congestion, rhinorrhea, sneezing and sore throat.   Eyes: Negative for photophobia, pain and redness.   Respiratory: Negative for cough, chest tightness and shortness of breath.   Cardiovascular: Negative for chest pain and palpitations.  Gastrointestinal: Negative for abdominal pain, constipation, diarrhea, nausea and vomiting.  Endocrine: Negative.   Genitourinary: Negative for dysuria and frequency.  Musculoskeletal: Negative for arthralgias, back pain, joint swelling and neck pain.  Skin: Negative for rash.  Allergic/Immunologic: Negative.   Neurological: Negative for tremors and numbness.  Hematological: Negative for adenopathy. Does not bruise/bleed easily.  Psychiatric/Behavioral: Negative for behavioral problems and sleep disturbance. The patient is not nervous/anxious.     Vital Signs: BP (!) 132/92   Pulse 92   Resp 16   Ht 5' 3"  (1.6 m)   Wt 161 lb (73 kg)   SpO2 96%   BMI 28.52 kg/m    Physical Exam Vitals signs and nursing note reviewed.  Constitutional:      General: She is not in acute distress.    Appearance: She is well-developed. She is not diaphoretic.  HENT:     Head: Normocephalic and atraumatic.     Mouth/Throat:     Pharynx: No oropharyngeal exudate.  Eyes:     Pupils: Pupils are equal, round, and reactive to light.  Neck:     Musculoskeletal: Normal range of motion and neck supple.     Thyroid: No thyromegaly.     Vascular: No JVD.     Trachea: No tracheal deviation.  Cardiovascular:     Rate and Rhythm: Normal rate and regular rhythm.     Heart sounds: Normal heart sounds. No murmur. No friction rub. No gallop.   Pulmonary:     Effort: Pulmonary effort is normal. No respiratory distress.     Breath sounds: Normal breath sounds. No wheezing or rales.  Chest:     Chest wall: No tenderness.  Abdominal:     Palpations: Abdomen is soft.     Tenderness: There is no abdominal tenderness. There is no guarding.  Musculoskeletal: Normal range of motion.  Lymphadenopathy:     Cervical: No cervical adenopathy.  Skin:    General: Skin is warm and  dry.  Neurological:     Mental Status: She is alert and oriented to person, place, and time.     Cranial Nerves: No cranial nerve deficit.  Psychiatric:        Behavior: Behavior normal.        Thought Content: Thought content normal.        Judgment: Judgment normal.    Assessment/Plan: 1. Acute non-recurrent frontal sinusitis Prescribed patient a course of Augmentin.  Instructed patient to call medication until pleated.  Return to clinic if symptoms do not  improve in 7 to 10 days. - amoxicillin-clavulanate (AUGMENTIN) 875-125 MG tablet; Take 1 tablet by mouth 2 (two) times daily.  Dispense: 14 tablet; Refill: 0  2. Morbid obesity ([place]) Encourage patient to continue her exercise routine and her dietary choices.  Patient has lost 8 pounds and continues to do without any side effects at this time. Obesity Counseling: Risk Assessment: An assessment of behavioral risk factors was made today and includes lack of exercise sedentary lifestyle, lack of portion control and poor dietary habits.  Risk Modification Advice: She was counseled on portion control guidelines. Restricting daily caloric intake to. . The detrimental long term effects of obesity on her health and ongoing poor compliance was also discussed with the patient.  There is a liability release in patients' chart. There has been a 10 minute discussion about the side effects including but not limited to elevated blood pressure, anxiety, lack of sleep and dry mouth. Pt understands and will like to start/continue on appetite suppressant at this time. There will be one month RX given at the time of visit with proper follow up. Nova diet plan with restricted calories is given to the pt. Pt understands and agrees with  plan of treatment - phentermine 37.5 MG capsule; Take 1 capsule (37.5 mg total) by mouth every morning.  Dispense: 30 capsule; Refill: 0  3. Allergic state, initial encounter RX for Montelukast sent for patient.    4.  Elevated BP without diagnosis of hypertension Pts blood pressure initially 132/92.  On repeat it was 120/80.    General Counseling: Eliyana verbalizes understanding of the findings of todays visit and agrees with plan of treatment. I have discussed any further diagnostic evaluation that may be needed or ordered today. We also reviewed her medications today. she has been encouraged to call the office with any questions or concerns that should arise related to todays visit.    No orders of the defined types were placed in this encounter.   Meds ordered this encounter  Medications  . phentermine 37.5 MG capsule    Sig: Take 1 capsule (37.5 mg total) by mouth every morning.    Dispense:  30 capsule    Refill:  0    Please use tablets instead of capsules.  Marland Kitchen amoxicillin-clavulanate (AUGMENTIN) 875-125 MG tablet    Sig: Take 1 tablet by mouth 2 (two) times daily.    Dispense:  14 tablet    Refill:  0  . montelukast (SINGULAIR) 10 MG tablet    Sig: Take 1 tablet (10 mg total) by mouth at bedtime.    Dispense:  30 tablet    Refill:  3    Time spent: 25 Minutes   This patient was seen by Orson Gear AGNP-C in Collaboration with Dr Lavera Guise as a part of collaborative care agreement     Kendell Bane AGNP-C Internal medicine

## 2018-12-26 DIAGNOSIS — Z3202 Encounter for pregnancy test, result negative: Secondary | ICD-10-CM | POA: Diagnosis not present

## 2018-12-26 DIAGNOSIS — R945 Abnormal results of liver function studies: Secondary | ICD-10-CM | POA: Insufficient documentation

## 2018-12-26 DIAGNOSIS — N912 Amenorrhea, unspecified: Secondary | ICD-10-CM | POA: Diagnosis not present

## 2018-12-26 DIAGNOSIS — Z6829 Body mass index (BMI) 29.0-29.9, adult: Secondary | ICD-10-CM | POA: Diagnosis not present

## 2018-12-26 DIAGNOSIS — Z01419 Encounter for gynecological examination (general) (routine) without abnormal findings: Secondary | ICD-10-CM | POA: Diagnosis not present

## 2018-12-26 DIAGNOSIS — E282 Polycystic ovarian syndrome: Secondary | ICD-10-CM | POA: Insufficient documentation

## 2018-12-26 DIAGNOSIS — N800 Endometriosis of uterus: Secondary | ICD-10-CM | POA: Diagnosis not present

## 2019-01-18 ENCOUNTER — Ambulatory Visit: Payer: Self-pay | Admitting: Nurse Practitioner

## 2019-02-04 DIAGNOSIS — L700 Acne vulgaris: Secondary | ICD-10-CM | POA: Diagnosis not present

## 2019-02-04 DIAGNOSIS — L658 Other specified nonscarring hair loss: Secondary | ICD-10-CM | POA: Diagnosis not present

## 2019-02-19 ENCOUNTER — Encounter: Payer: Self-pay | Admitting: Adult Health

## 2019-02-19 ENCOUNTER — Ambulatory Visit: Payer: BC Managed Care – PPO | Admitting: Adult Health

## 2019-02-19 ENCOUNTER — Other Ambulatory Visit: Payer: Self-pay

## 2019-02-19 VITALS — Resp 16 | Ht 63.0 in | Wt 158.0 lb

## 2019-02-19 DIAGNOSIS — T7840XS Allergy, unspecified, sequela: Secondary | ICD-10-CM

## 2019-02-19 DIAGNOSIS — Z6827 Body mass index (BMI) 27.0-27.9, adult: Secondary | ICD-10-CM | POA: Diagnosis not present

## 2019-02-19 MED ORDER — PHENTERMINE HCL 37.5 MG PO CAPS: 37.5000 mg | ORAL_CAPSULE | ORAL | 0 refills | Status: DC

## 2019-02-19 NOTE — Progress Notes (Signed)
[facility] [place], [place] [address]  Internal MEDICINE  Telephone Visit  Patient Name: Jennifer Barron  [id number]  [id number]  Date of Service: 02/19/2019  I connected with the patient at 0835 by telephone and verified the patients identity using two identifiers.  I discussed the limitations, risks, security and privacy concerns of performing an evaluation and management service by telephone and the availability of in person appointments. I also discussed with the patient that there may be a patient responsible charge related to the service.  The patient expressed understanding and agrees to proceed.    Chief Complaint  Patient presents with  . Telephone Screen  . Medical Management of Chronic Issues    weight loss management   . Telephone Assessment    HPI  Patient is using phentermine for weight loss.  Since our last visit they have lost 2-3 pounds on her home scale.  The patient denies any chest pain, palpitations, shortness of breath, constipation, headaches or any other side effects of the medication.  Patient wishes to continue to use this medication for weight loss at this time. With the stay at home orders for COVID-19 she has been walking more, and feels like she has finally broken her 160 pound mark.     Current Medication: Outpatient Encounter Medications as of 02/19/2019  Medication Sig  . cholecalciferol (VITAMIN D) 1000 units tablet Take 1 tablet (1,000 Units total) by mouth daily.  . minoxidil (LONITEN) 2.5 MG tablet 1/2 pill  Once a day  . montelukast (SINGULAIR) 10 MG tablet Take 1 tablet (10 mg total) by mouth at bedtime.  . phentermine 37.5 MG capsule Take 1 capsule (37.5 mg total) by mouth every morning.  Marland Kitchen spironolactone (ALDACTONE) 50 MG tablet   . [DISCONTINUED] phentermine 37.5 MG capsule Take 1 capsule (37.5 mg total) by mouth every morning.  . docusate sodium (COLACE) 100 MG capsule Take 1 capsule (100 mg total) by mouth  daily.  . [DISCONTINUED] amoxicillin-clavulanate (AUGMENTIN) 875-125 MG tablet Take 1 tablet by mouth 2 (two) times daily. (Patient not taking: Reported on 02/19/2019)   No facility-administered encounter medications on file as of 02/19/2019.     Surgical History: Past Surgical History:  Procedure Laterality Date  . abdominal laproscopy    . ADENOIDECTOMY     twice  . TONSILLECTOMY Bilateral 1987  . WISDOM TOOTH EXTRACTION  2009    Medical History: Past Medical History:  Diagnosis Date  . Endometriosis   . Migraine     Family History: Family History  Problem Relation Age of Onset  . Ovarian cancer Paternal Grandmother   . Cancer Paternal Grandmother   . Prostate cancer Paternal Grandfather   . Cancer Paternal Grandfather   . Cancer Father     Social History   Socioeconomic History  . Marital status: Married    Spouse name: Not on file  . Number of children: Not on file  . Years of education: Not on file  . Highest education level: Not on file  Occupational History  . Not on file  Social Needs  . Financial resource strain: Not on file  . Food insecurity:    Worry: Not on file    Inability: Not on file  . Transportation needs:    Medical: Not on file    Non-medical: Not on file  Tobacco Use  . Smoking status: Never Smoker  . Smokeless tobacco: Never Used  Substance and Sexual Activity  . Alcohol  use: Yes    Frequency: Never    Comment: social  . Drug use: No  . Sexual activity: Yes  Lifestyle  . Physical activity:    Days per week: Not on file    Minutes per session: Not on file  . Stress: Not on file  Relationships  . Social connections:    Talks on phone: Not on file    Gets together: Not on file    Attends religious service: Not on file    Active member of club or organization: Not on file    Attends meetings of clubs or organizations: Not on file    Relationship status: Not on file  . Intimate partner violence:    Fear of current or ex  partner: Not on file    Emotionally abused: Not on file    Physically abused: Not on file    Forced sexual activity: Not on file  Other Topics Concern  . Not on file  Social History Narrative  . Not on file      Review of Systems  Constitutional: Negative for chills, fatigue and unexpected weight change.  HENT: Negative for congestion, rhinorrhea, sneezing and sore throat.   Eyes: Negative for photophobia, pain and redness.  Respiratory: Negative for cough, chest tightness and shortness of breath.   Cardiovascular: Negative for chest pain and palpitations.  Gastrointestinal: Negative for abdominal pain, constipation, diarrhea, nausea and vomiting.  Endocrine: Negative.   Genitourinary: Negative for dysuria and frequency.  Musculoskeletal: Negative for arthralgias, back pain, joint swelling and neck pain.  Skin: Negative for rash.  Allergic/Immunologic: Negative.   Neurological: Negative for tremors and numbness.  Hematological: Negative for adenopathy. Does not bruise/bleed easily.  Psychiatric/Behavioral: Negative for behavioral problems and sleep disturbance. The patient is not nervous/anxious.     Vital Signs: Resp 16   Ht 5' 3"  (1.6 m)   Wt 158 lb (71.7 kg)   BMI 27.99 kg/m    Observation/Objective: Speaking in full sentences, NAD noted.     Assessment/Plan: 1. Allergic state, sequela Stable, continue present therapy.   2. Morbid obesity ([place]) Obesity Counseling: Risk Assessment: An assessment of behavioral risk factors was made today and includes lack of exercise sedentary lifestyle, lack of portion control and poor dietary habits.  Risk Modification Advice: She was counseled on portion control guidelines. Restricting daily caloric intake to. . The detrimental long term effects of obesity on her health and ongoing poor compliance was also discussed with the patient.   There is a liability release in patients' chart. There has been a 10 minute discussion about  the side effects including but not limited to elevated blood pressure, anxiety, lack of sleep and dry mouth. Pt understands and will like to start/continue on appetite suppressant at this time. There will be one month RX given at the time of visit with proper follow up. Nova diet plan with restricted calories is given to the pt. Pt understands and agrees with  plan of treatment  3. Hereditary hemochromatosis ([place]) Stable, follow up with hematology in May as scheduled.   General Counseling: Joani verbalizes understanding of the findings of today's phone visit and agrees with plan of treatment. I have discussed any further diagnostic evaluation that may be needed or ordered today. We also reviewed her medications today. she has been encouraged to call the office with any questions or concerns that should arise related to todays visit.    No orders of the defined types were placed in  this encounter.   Meds ordered this encounter  Medications  . phentermine 37.5 MG capsule    Sig: Take 1 capsule (37.5 mg total) by mouth every morning.    Dispense:  30 capsule    Refill:  0    Please use tablets instead of capsules.    Time spent: [place] AGNP-C Internal medicine

## 2019-02-19 NOTE — Patient Instructions (Signed)
Phentermine orally disintegrating tablets (ODT) What is this medicine? PHENTERMINE (FEN ter meen) decreases your appetite. It is used with a reduced calorie diet and exercise to help you lose weight. This medicine may be used for other purposes; ask your health care provider or pharmacist if you have questions. COMMON BRAND NAME(S): Suprenza What should I tell my health care provider before I take this medicine? They need to know if you have any of these conditions: -agitation or nervousness -diabetes -glaucoma -heart disease -high blood pressure -history of drug abuse or addiction -history of stroke -kidney disease -lung disease called Primary Pulmonary Hypertension (PPH) -taken an MAOI like Carbex, Eldepryl, Marplan, Nardil, or Parnate in last 14 days -taking stimulant medicines for attention disorders, weight loss, or to stay awake -thyroid disease -an unusual or allergic reaction to phentermine, other medicines, foods, dyes, or preservatives -pregnant or trying to get pregnant -breast-feeding How should I use this medicine? Take this medicine by mouth. Follow the directions on the prescription label. The tablets should stay in the bottle until immediately before you take your dose. Use dry hands to remove the dose from the bottle. Do not cut or split the tablets in half. Place the tablet in the mouth and allow it to dissolve, and then swallow. While you may take these tablets with water, it is not necessary to do so. Take your doses at regular intervals. Do not take your medicine more often than directed. Do not stop taking except on the advice of your doctor or health care professional. Talk to your pediatrician regarding the use of this medicine in children. Special care may be needed. Overdosage: If you think you have taken too much of this medicine contact a poison control center or emergency room at once. NOTE: This medicine is only for you. Do not share this medicine with  others. What if I miss a dose? If you miss a dose, take it as soon as you can. If it is almost time for your next dose, take only that dose. Do not take double or extra doses. What may interact with this medicine? Do not take this medicine with any of the following medications: -MAOIs like Carbex, Eldepryl, Marplan, Nardil, and Parnate -medicines for colds or breathing difficulties like pseudoephedrine or phenylephrine -procarbazine -sibutramine -stimulant medicines for attention disorders, weight loss, or to stay awake This medicine may also interact with the following medications: -certain medicines for depression, anxiety, or psychotic disturbances -linezolid -medicines for diabetes -medicines for high blood pressure This list may not describe all possible interactions. Give your health care provider a list of all the medicines, herbs, non-prescription drugs, or dietary supplements you use. Also tell them if you smoke, drink alcohol, or use illegal drugs. Some items may interact with your medicine. What should I watch for while using this medicine? Notify your physician immediately if you become short of breath while doing your normal activities. Do not take this medicine within 6 hours of bedtime. It can keep you from getting to sleep. Avoid drinks that contain caffeine and try to stick to a regular bedtime every night. This medicine is intended to be used in addition to a healthy diet and exercise. The best results are achieved this way. This medicine is only indicated for short-term use. Eventually your weight loss may level out. At that point, the drug will only help you maintain your new weight. Do not increase or in any way change your dose without consulting your doctor. You may get  drowsy or dizzy. Do not drive, use machinery, or do anything that needs mental alertness until you know how this medicine affects you. Do not stand or sit up quickly, especially if you are an older patient.  This reduces the risk of dizzy or fainting spells. Alcohol may increase dizziness and drowsiness. Avoid alcoholic drinks. What side effects may I notice from receiving this medicine? Side effects that you should report to your doctor or health care professional as soon as possible: -allergic reactions like skin rash, itching or hives, swelling of the face, lips, or tongue) -anxiety -breathing problems -changes in vision -chest pain or chest tightness -depressed mood or other mood changes -hallucinations, loss of contact with reality -fast, irregular heartbeat -increased blood pressure -irritable -nervousness or restlessness -painful urination -palpitations -tremors -trouble sleeping -seizures -signs and symptoms of a stroke like changes in vision; confusion; trouble speaking or understanding; severe headaches; sudden numbness or weakness of the face, arm or leg; trouble walking; dizziness; loss of balance or coordination -unusually weak or tired -vomiting Side effects that usually do not require medical attention (report to your doctor or health care professional if they continue or are bothersome): -constipation or diarrhea -dry mouth -headache -nausea -stomach upset -sweating This list may not describe all possible side effects. Call your doctor for medical advice about side effects. You may report side effects to FDA at 1-800-FDA-1088. Where should I keep my medicine? Keep out of the reach of children. This medicine can be abused. Keep your medicine in a safe place to protect it from theft. Do not share this medicine with anyone. Selling or giving away this medicine is dangerous and against the law. This medicine may cause accidental overdose and death if taken by other adults, children, or pets. Mix any unused medicine with a substance like cat litter or coffee grounds. Then throw the medicine away in a sealed container like a sealed bag or a coffee can with a lid. Do not use the  medicine after the expiration date. Store at room temperature between 20 and 25 degrees C (68 and 77 degrees F). Keep container tightly closed. NOTE: This sheet is a summary. It may not cover all possible information. If you have questions about this medicine, talk to your doctor, pharmacist, or health care provider.  2019 Elsevier/Gold Standard (2017-03-24 08:19:54)

## 2019-03-08 ENCOUNTER — Other Ambulatory Visit: Payer: Self-pay

## 2019-03-11 ENCOUNTER — Encounter: Payer: Self-pay | Admitting: Oncology

## 2019-03-11 ENCOUNTER — Inpatient Hospital Stay (HOSPITAL_BASED_OUTPATIENT_CLINIC_OR_DEPARTMENT_OTHER): Payer: BLUE CROSS/BLUE SHIELD | Admitting: Oncology

## 2019-03-11 ENCOUNTER — Other Ambulatory Visit: Payer: Self-pay

## 2019-03-11 ENCOUNTER — Inpatient Hospital Stay: Payer: BLUE CROSS/BLUE SHIELD | Attending: Oncology

## 2019-03-11 ENCOUNTER — Other Ambulatory Visit: Payer: Self-pay | Admitting: Oncology

## 2019-03-11 DIAGNOSIS — Z79899 Other long term (current) drug therapy: Secondary | ICD-10-CM | POA: Diagnosis not present

## 2019-03-11 DIAGNOSIS — R5383 Other fatigue: Secondary | ICD-10-CM

## 2019-03-11 LAB — CBC
HCT: 42 % (ref 36.0–46.0)
Hemoglobin: 14.6 g/dL (ref 12.0–15.0)
MCH: 31.7 pg (ref 26.0–34.0)
MCHC: 34.8 g/dL (ref 30.0–36.0)
MCV: 91.3 fL (ref 80.0–100.0)
Platelets: 324 10*3/uL (ref 150–400)
RBC: 4.6 MIL/uL (ref 3.87–5.11)
RDW: 11.8 % (ref 11.5–15.5)
WBC: 5.9 10*3/uL (ref 4.0–10.5)
nRBC: 0 % (ref 0.0–0.2)

## 2019-03-11 LAB — IRON AND TIBC
Iron: 82 ug/dL (ref 28–170)
Saturation Ratios: 19 % (ref 10.4–31.8)
TIBC: 438 ug/dL (ref 250–450)
UIBC: 356 ug/dL

## 2019-03-11 LAB — COMPREHENSIVE METABOLIC PANEL
ALT: 34 U/L (ref 0–44)
AST: 36 U/L (ref 15–41)
Albumin: 4.4 g/dL (ref 3.5–5.0)
Alkaline Phosphatase: 46 U/L (ref 38–126)
Anion gap: 9 (ref 5–15)
BUN: 10 mg/dL (ref 6–20)
CO2: 25 mmol/L (ref 22–32)
Calcium: 9.1 mg/dL (ref 8.9–10.3)
Chloride: 103 mmol/L (ref 98–111)
Creatinine, Ser: 0.64 mg/dL (ref 0.44–1.00)
GFR calc Af Amer: >60 mL/min (ref >60)
GFR calc non Af Amer: >60 mL/min (ref >60)
Glucose, Bld: 85 mg/dL (ref 70–99)
Potassium: 4.2 mmol/L (ref 3.5–5.1)
Sodium: 137 mmol/L (ref 135–145)
Total Bilirubin: 0.8 mg/dL (ref 0.3–1.2)
Total Protein: 7.5 g/dL (ref 6.5–8.1)

## 2019-03-11 LAB — FERRITIN: Ferritin: 26 ng/mL (ref 11–307)

## 2019-03-11 NOTE — Progress Notes (Signed)
Called patient for Telehealth visit via [place].  Patient states no new concerns today.

## 2019-03-11 NOTE — Progress Notes (Signed)
HEMATOLOGY-ONCOLOGY TeleHEALTH VISIT PROGRESS NOTE  I connected with Jennifer Barron on 03/11/19 at  1:15 PM EDT by video enabled telemedicine visit and verified that I am speaking with the correct person using two identifiers. I discussed the limitations, risks, security and privacy concerns of performing an evaluation and management service by telemedicine and the availability of in-person appointments. I also discussed with the patient that there may be a patient responsible charge related to this service. The patient expressed understanding and agreed to proceed.   Other persons participating in the visit and their role in the encounter:  Geraldine Solar, Media, check in patient   Janeann Merl, RN, check in patient.   Patient's location: Home  Provider's location: Home office Chief Complaint: Follow-up for management of iron overload and hemochromatosis.   INTERVAL HISTORY Jennifer Barron is a 38 y.o. female who has above history reviewed by me today presents for follow up visit for management of iron overload and hemochromatosis. Problems and complaints are listed below:  Patient reports feeling well at baseline.  Fatigue is at baseline.  No changed.  Appetite is fair.  She has no new complaints today.  Denies any pain.  Review of Systems  Constitutional: Negative for appetite change, chills, fatigue and fever.  HENT:   Negative for hearing loss and voice change.   Eyes: Negative for eye problems.  Respiratory: Negative for chest tightness and cough.   Cardiovascular: Negative for chest pain.  Gastrointestinal: Negative for abdominal distention, abdominal pain and blood in stool.  Endocrine: Negative for hot flashes.  Genitourinary: Negative for difficulty urinating and frequency.   Musculoskeletal: Negative for arthralgias.  Skin: Negative for itching and rash.  Neurological: Negative for extremity weakness.  Hematological: Negative for adenopathy.   Psychiatric/Behavioral: Negative for confusion.    Past Medical History:  Diagnosis Date  . Endometriosis   . Migraine    Past Surgical History:  Procedure Laterality Date  . abdominal laproscopy    . ADENOIDECTOMY     twice  . TONSILLECTOMY Bilateral 1987  . WISDOM TOOTH EXTRACTION  2009    Family History  Problem Relation Age of Onset  . Ovarian cancer Paternal Grandmother   . Cancer Paternal Grandmother   . Prostate cancer Paternal Grandfather   . Cancer Paternal Grandfather   . Cancer Father     Social History   Socioeconomic History  . Marital status: Married    Spouse name: Not on file  . Number of children: Not on file  . Years of education: Not on file  . Highest education level: Not on file  Occupational History  . Not on file  Social Needs  . Financial resource strain: Not on file  . Food insecurity:    Worry: Not on file    Inability: Not on file  . Transportation needs:    Medical: Not on file    Non-medical: Not on file  Tobacco Use  . Smoking status: Never Smoker  . Smokeless tobacco: Never Used  Substance and Sexual Activity  . Alcohol use: Yes    Frequency: Never    Comment: social  . Drug use: No  . Sexual activity: Yes  Lifestyle  . Physical activity:    Days per week: Not on file    Minutes per session: Not on file  . Stress: Not on file  Relationships  . Social connections:    Talks on phone: Not on file    Gets together: Not on file  Attends religious service: Not on file    Active member of club or organization: Not on file    Attends meetings of clubs or organizations: Not on file    Relationship status: Not on file  . Intimate partner violence:    Fear of current or ex partner: Not on file    Emotionally abused: Not on file    Physically abused: Not on file    Forced sexual activity: Not on file  Other Topics Concern  . Not on file  Social History Narrative  . Not on file    Current Outpatient Medications on File  Prior to Visit  Medication Sig Dispense Refill  . cholecalciferol (VITAMIN D) 1000 units tablet Take 1 tablet (1,000 Units total) by mouth daily. 90 tablet 0  . minoxidil (LONITEN) 2.5 MG tablet 1/2 pill  Once a day    . montelukast (SINGULAIR) 10 MG tablet Take 1 tablet (10 mg total) by mouth at bedtime. 30 tablet 3  . phentermine 37.5 MG capsule Take 1 capsule (37.5 mg total) by mouth every morning. 30 capsule 0  . spironolactone (ALDACTONE) 50 MG tablet   4  . docusate sodium (COLACE) 100 MG capsule Take 1 capsule (100 mg total) by mouth daily. 30 capsule 0  . progesterone (PROMETRIUM) 200 MG capsule Prometrium 200 mg capsule  Take 1 capsule every day by oral route in the evening for 10 days.     No current facility-administered medications on file prior to visit.     No Known Allergies     Observations/Objective: There were no vitals filed for this visit. There is no height or weight on file to calculate BMI.  Physical Exam  Constitutional: She is oriented to person, place, and time. No distress.  HENT:  Head: Normocephalic and atraumatic.  Pulmonary/Chest: Effort normal.  Neurological: She is alert and oriented to person, place, and time.  Psychiatric: Affect normal.    CBC    Component Value Date/Time   WBC 5.9 03/11/2019 0825   RBC 4.60 03/11/2019 0825   HGB 14.6 03/11/2019 0825   HCT 42.0 03/11/2019 0825   PLT 324 03/11/2019 0825   MCV 91.3 03/11/2019 0825   MCH 31.7 03/11/2019 0825   MCHC 34.8 03/11/2019 0825   RDW 11.8 03/11/2019 0825   LYMPHSABS 1.4 12/10/2018 0831   MONOABS 0.5 12/10/2018 0831   EOSABS 0.1 12/10/2018 0831   BASOSABS 0.0 12/10/2018 0831    CMP     Component Value Date/Time   NA 137 03/11/2019 0825   K 4.2 03/11/2019 0825   CL 103 03/11/2019 0825   CO2 25 03/11/2019 0825   GLUCOSE 85 03/11/2019 0825   BUN 10 03/11/2019 0825   CREATININE 0.64 03/11/2019 0825   CALCIUM 9.1 03/11/2019 0825   CALCIUM 9.8 01/31/2018 1515   PROT 7.5  03/11/2019 0825   ALBUMIN 4.4 03/11/2019 0825   AST 36 03/11/2019 0825   ALT 34 03/11/2019 0825   ALKPHOS 46 03/11/2019 0825   BILITOT 0.8 03/11/2019 0825   GFRNONAA >60 03/11/2019 0825   GFRAA >60 03/11/2019 0825     Assessment and Plan: 1. Hereditary hemochromatosis ([place])   2. Iron overload     Labs reviewed and discussed with patient. Her hemoglobin has been stable. Iron panel shows slightly increased ferritin compared to 3 months ago.  Ferritin level 26, at goal. No need for phlebotomy for now. Plan repeat labs in 3 months. Patient can follow-up in the clinic in  6 months for assessment for additional need for phlebotomy.  Follow Up Instructions: Labs in 3 months Lab MD assessment in 6 months.   I discussed the assessment and treatment plan with the patient. The patient was provided an opportunity to ask questions and all were answered. The patient agreed with the plan and demonstrated an understanding of the instructions.  The patient was advised to call back or seek an in-person evaluation if the symptoms worsen or if the condition fails to improve as anticipated.   I provided 15 minutes of face-to-face video visit time during this encounter, and > 50% was spent counseling as documented under my assessment & plan.  Earlie Server, MD 03/11/2019 5:09 PM

## 2019-03-12 ENCOUNTER — Inpatient Hospital Stay: Payer: BLUE CROSS/BLUE SHIELD

## 2019-03-12 ENCOUNTER — Inpatient Hospital Stay: Payer: BLUE CROSS/BLUE SHIELD | Admitting: Oncology

## 2019-04-05 ENCOUNTER — Ambulatory Visit: Payer: BC Managed Care – PPO | Admitting: Nurse Practitioner

## 2019-04-08 ENCOUNTER — Encounter: Payer: Self-pay | Admitting: Adult Health

## 2019-04-08 ENCOUNTER — Other Ambulatory Visit: Payer: Self-pay

## 2019-04-08 ENCOUNTER — Ambulatory Visit: Payer: BC Managed Care – PPO | Admitting: Adult Health

## 2019-04-08 VITALS — Ht 63.0 in | Wt 158.0 lb

## 2019-04-08 DIAGNOSIS — F419 Anxiety disorder, unspecified: Secondary | ICD-10-CM | POA: Diagnosis not present

## 2019-04-08 DIAGNOSIS — R635 Abnormal weight gain: Secondary | ICD-10-CM | POA: Diagnosis not present

## 2019-04-08 NOTE — Progress Notes (Signed)
[facility] [address], [place] [address]  Internal MEDICINE  Telephone Visit  Patient Name: Jennifer Barron  [id number]  [id number]  Date of Service: 04/08/2019  I connected with the patient at 1035 by telephone and verified the patients identity using two identifiers.   I discussed the limitations, risks, security and privacy concerns of performing an evaluation and management service by telephone and the availability of in person appointments. I also discussed with the patient that there may be a patient responsible charge related to the service.  The patient expressed understanding and agrees to proceed.    Chief Complaint  Patient presents with  . Medical Management of Chronic Issues    Weight Management    HPI  Pt is seen for follow up on weight loss.  She has now been on Phentermine for 6 months and will be taking a break at this time.  She reports with COVID-19 her anxiety has been elevated recently, and she had a panic attack for the first time in over 10 years. We discussed that the phentermine is a stimulant and could make her current anxiety level worse.  She agreed that it is a good time to take a medication vacation.  She has maintained her weight over the last two months and continues to work out with a trainer, and is watching her diet.   Current Medication: Outpatient Encounter Medications as of 04/08/2019  Medication Sig  . cholecalciferol (VITAMIN D) 1000 units tablet Take 1 tablet (1,000 Units total) by mouth daily.  . minoxidil (LONITEN) 2.5 MG tablet 1/2 pill  Once a day  . montelukast (SINGULAIR) 10 MG tablet Take 1 tablet (10 mg total) by mouth at bedtime.  . phentermine 37.5 MG capsule Take 1 capsule (37.5 mg total) by mouth every morning.  . progesterone (PROMETRIUM) 200 MG capsule Prometrium 200 mg capsule  Take 1 capsule every day by oral route in the evening for 10 days.  Marland Kitchen spironolactone (ALDACTONE) 50 MG tablet   . docusate  sodium (COLACE) 100 MG capsule Take 1 capsule (100 mg total) by mouth daily.   No facility-administered encounter medications on file as of 04/08/2019.     Surgical History: Past Surgical History:  Procedure Laterality Date  . abdominal laproscopy    . ADENOIDECTOMY     twice  . TONSILLECTOMY Bilateral 1987  . WISDOM TOOTH EXTRACTION  2009    Medical History: Past Medical History:  Diagnosis Date  . Endometriosis   . Migraine     Family History: Family History  Problem Relation Age of Onset  . Ovarian cancer Paternal Grandmother   . Cancer Paternal Grandmother   . Prostate cancer Paternal Grandfather   . Cancer Paternal Grandfather   . Cancer Father     Social History   Socioeconomic History  . Marital status: Married    Spouse name: Not on file  . Number of children: Not on file  . Years of education: Not on file  . Highest education level: Not on file  Occupational History  . Not on file  Social Needs  . Financial resource strain: Not on file  . Food insecurity    Worry: Not on file    Inability: Not on file  . Transportation needs    Medical: Not on file    Non-medical: Not on file  Tobacco Use  . Smoking status: Never Smoker  . Smokeless tobacco: Never Used  Substance and Sexual Activity  .  Alcohol use: Yes    Frequency: Never    Comment: social  . Drug use: No  . Sexual activity: Yes  Lifestyle  . Physical activity    Days per week: Not on file    Minutes per session: Not on file  . Stress: Not on file  Relationships  . Social Herbalist on phone: Not on file    Gets together: Not on file    Attends religious service: Not on file    Active member of club or organization: Not on file    Attends meetings of clubs or organizations: Not on file    Relationship status: Not on file  . Intimate partner violence    Fear of current or ex partner: Not on file    Emotionally abused: Not on file    Physically abused: Not on file    Forced  sexual activity: Not on file  Other Topics Concern  . Not on file  Social History Narrative  . Not on file      Review of Systems  Constitutional: Negative for chills, fatigue and unexpected weight change.  HENT: Negative for congestion, rhinorrhea, sneezing and sore throat.   Eyes: Negative for photophobia, pain and redness.  Respiratory: Negative for cough, chest tightness and shortness of breath.   Cardiovascular: Negative for chest pain and palpitations.  Gastrointestinal: Negative for abdominal pain, constipation, diarrhea, nausea and vomiting.  Endocrine: Negative.   Genitourinary: Negative for dysuria and frequency.  Musculoskeletal: Negative for arthralgias, back pain, joint swelling and neck pain.  Skin: Negative for rash.  Allergic/Immunologic: Negative.   Neurological: Negative for tremors and numbness.  Hematological: Negative for adenopathy. Does not bruise/bleed easily.  Psychiatric/Behavioral: Negative for behavioral problems and sleep disturbance. The patient is not nervous/anxious.     Vital Signs: Ht 5' 3"  (1.6 m)   Wt 158 lb (71.7 kg)   BMI 27.99 kg/m    Observation/Objective:  Well appearing, NAD noted at this time.     Assessment/Plan: 1. Weight gain Will stop phentermine, and follow up in 6 weeks for recheck.    2. Anxiety Discussed starting a low dose daily anti-anxiety medication like celexa or lexapro.  Pt would like to stop phentermine and see how her anxiety improves. She will call if she decides to start a daily medication.   General Counseling: Jennifer Barron verbalizes understanding of the findings of today's phone visit and agrees with plan of treatment. I have discussed any further diagnostic evaluation that may be needed or ordered today. We also reviewed her medications today. she has been encouraged to call the office with any questions or concerns that should arise related to todays visit.    No orders of the defined types were placed in  this encounter.   No orders of the defined types were placed in this encounter.   Time spent: [facility] Internal medicine

## 2019-04-23 DIAGNOSIS — Z03818 Encounter for observation for suspected exposure to other biological agents ruled out: Secondary | ICD-10-CM | POA: Diagnosis not present

## 2019-05-20 ENCOUNTER — Encounter: Payer: Self-pay | Admitting: Oncology

## 2019-05-23 ENCOUNTER — Other Ambulatory Visit: Payer: Self-pay

## 2019-05-23 ENCOUNTER — Ambulatory Visit: Payer: BC Managed Care – PPO | Admitting: Adult Health

## 2019-06-17 ENCOUNTER — Encounter: Payer: Self-pay | Admitting: Oncology

## 2019-06-19 DIAGNOSIS — M531 Cervicobrachial syndrome: Secondary | ICD-10-CM | POA: Diagnosis not present

## 2019-06-19 DIAGNOSIS — M530 Cervicocranial syndrome: Secondary | ICD-10-CM | POA: Diagnosis not present

## 2019-06-19 DIAGNOSIS — M5387 Other specified dorsopathies, lumbosacral region: Secondary | ICD-10-CM | POA: Diagnosis not present

## 2019-06-20 ENCOUNTER — Other Ambulatory Visit: Payer: Self-pay

## 2019-06-21 ENCOUNTER — Other Ambulatory Visit: Payer: Self-pay

## 2019-06-21 ENCOUNTER — Inpatient Hospital Stay: Payer: BC Managed Care – PPO | Attending: Oncology

## 2019-06-21 LAB — IRON AND TIBC
Iron: 149 ug/dL (ref 28–170)
Saturation Ratios: 30 % (ref 10.4–31.8)
TIBC: 503 ug/dL — ABNORMAL HIGH (ref 250–450)
UIBC: 354 ug/dL

## 2019-06-21 LAB — HEMOGLOBIN AND HEMATOCRIT, BLOOD
HCT: 43.2 % (ref 36.0–46.0)
Hemoglobin: 15 g/dL (ref 12.0–15.0)

## 2019-06-21 LAB — FERRITIN: Ferritin: 92 ng/mL (ref 11–307)

## 2019-06-27 ENCOUNTER — Encounter: Payer: Self-pay | Admitting: Oncology

## 2019-07-05 DIAGNOSIS — M531 Cervicobrachial syndrome: Secondary | ICD-10-CM | POA: Diagnosis not present

## 2019-07-05 DIAGNOSIS — M9902 Segmental and somatic dysfunction of thoracic region: Secondary | ICD-10-CM | POA: Diagnosis not present

## 2019-07-05 DIAGNOSIS — M9901 Segmental and somatic dysfunction of cervical region: Secondary | ICD-10-CM | POA: Diagnosis not present

## 2019-08-09 DIAGNOSIS — L700 Acne vulgaris: Secondary | ICD-10-CM | POA: Diagnosis not present

## 2019-08-09 DIAGNOSIS — L658 Other specified nonscarring hair loss: Secondary | ICD-10-CM | POA: Diagnosis not present

## 2019-08-20 ENCOUNTER — Other Ambulatory Visit: Payer: BLUE CROSS/BLUE SHIELD

## 2019-09-02 DIAGNOSIS — M531 Cervicobrachial syndrome: Secondary | ICD-10-CM | POA: Diagnosis not present

## 2019-09-02 DIAGNOSIS — M436 Torticollis: Secondary | ICD-10-CM | POA: Diagnosis not present

## 2019-09-02 DIAGNOSIS — M9902 Segmental and somatic dysfunction of thoracic region: Secondary | ICD-10-CM | POA: Diagnosis not present

## 2019-09-02 DIAGNOSIS — M9901 Segmental and somatic dysfunction of cervical region: Secondary | ICD-10-CM | POA: Diagnosis not present

## 2019-09-05 ENCOUNTER — Encounter: Payer: Self-pay | Admitting: Adult Health

## 2019-09-05 ENCOUNTER — Ambulatory Visit: Payer: BC Managed Care – PPO | Admitting: Adult Health

## 2019-09-05 ENCOUNTER — Other Ambulatory Visit: Payer: Self-pay

## 2019-09-05 VITALS — Temp 99.7°F | Ht 63.0 in

## 2019-09-05 DIAGNOSIS — J320 Chronic maxillary sinusitis: Secondary | ICD-10-CM | POA: Diagnosis not present

## 2019-09-05 DIAGNOSIS — Z20828 Contact with and (suspected) exposure to other viral communicable diseases: Secondary | ICD-10-CM | POA: Diagnosis not present

## 2019-09-05 DIAGNOSIS — Z20822 Contact with and (suspected) exposure to covid-19: Secondary | ICD-10-CM

## 2019-09-05 MED ORDER — AMOXICILLIN-POT CLAVULANATE 875-125 MG PO TABS: 1.0000 | ORAL_TABLET | Freq: Two times a day (BID) | ORAL | 0 refills | Status: DC

## 2019-09-05 NOTE — Progress Notes (Signed)
[facility] [place], [place] [address]  Internal MEDICINE  Telephone Visit  Patient Name: Jennifer Barron  [id number]  [id number]  Date of Service: 09/05/2019  I connected with the patient at 51by telephone and verified the patients identity using two identifiers.   I discussed the limitations, risks, security and privacy concerns of performing an evaluation and management service by telephone and the availability of in person appointments. I also discussed with the patient that there may be a patient responsible charge related to the service.  The patient expressed understanding and agrees to proceed.    Chief Complaint  Patient presents with  . Telephone Assessment    exposure to covid   . Telephone Screen  . Fever  . Sore Throat  . Sinusitis    HPI  PT seen via video. She reports someone in her dads office tested positive for covid.  She had spent the day before with her dad.  He remains well at this time.  She is having sore throat, low grade fever 99.9.  She also reports sinus congestion. She has been able to get up and exercise daily without difficulty.    Current Medication: Outpatient Encounter Medications as of 09/05/2019  Medication Sig  . cholecalciferol (VITAMIN D) 1000 units tablet Take 1 tablet (1,000 Units total) by mouth daily.  . montelukast (SINGULAIR) 10 MG tablet Take 1 tablet (10 mg total) by mouth at bedtime.  . progesterone (PROMETRIUM) 200 MG capsule Prometrium 200 mg capsule  Take 1 capsule every day by oral route in the evening for 10 days.  Marland Kitchen spironolactone (ALDACTONE) 50 MG tablet   . amoxicillin-clavulanate (AUGMENTIN) 875-125 MG tablet Take 1 tablet by mouth 2 (two) times daily.  Marland Kitchen docusate sodium (COLACE) 100 MG capsule Take 1 capsule (100 mg total) by mouth daily.  . minoxidil (LONITEN) 2.5 MG tablet 1/2 pill  Once a day  . phentermine 37.5 MG capsule Take 1 capsule (37.5 mg total) by mouth every morning. (Patient  not taking: Reported on 09/05/2019)   No facility-administered encounter medications on file as of 09/05/2019.     Surgical History: Past Surgical History:  Procedure Laterality Date  . abdominal laproscopy    . ADENOIDECTOMY     twice  . TONSILLECTOMY Bilateral 1987  . WISDOM TOOTH EXTRACTION  2009    Medical History: Past Medical History:  Diagnosis Date  . Endometriosis   . Migraine     Family History: Family History  Problem Relation Age of Onset  . Ovarian cancer Paternal Grandmother   . Cancer Paternal Grandmother   . Prostate cancer Paternal Grandfather   . Cancer Paternal Grandfather   . Cancer Father     Social History   Socioeconomic History  . Marital status: Married    Spouse name: Not on file  . Number of children: Not on file  . Years of education: Not on file  . Highest education level: Not on file  Occupational History  . Not on file  Social Needs  . Financial resource strain: Not on file  . Food insecurity    Worry: Not on file    Inability: Not on file  . Transportation needs    Medical: Not on file    Non-medical: Not on file  Tobacco Use  . Smoking status: Never Smoker  . Smokeless tobacco: Never Used  Substance and Sexual Activity  . Alcohol use: Yes    Frequency: Never    Comment:  social  . Drug use: No  . Sexual activity: Yes  Lifestyle  . Physical activity    Days per week: Not on file    Minutes per session: Not on file  . Stress: Not on file  Relationships  . Social Herbalist on phone: Not on file    Gets together: Not on file    Attends religious service: Not on file    Active member of club or organization: Not on file    Attends meetings of clubs or organizations: Not on file    Relationship status: Not on file  . Intimate partner violence    Fear of current or ex partner: Not on file    Emotionally abused: Not on file    Physically abused: Not on file    Forced sexual activity: Not on file  Other  Topics Concern  . Not on file  Social History Narrative  . Not on file      Review of Systems  Constitutional: Negative for chills, fatigue and unexpected weight change.  HENT: Negative for congestion, rhinorrhea, sneezing and sore throat.   Eyes: Negative for photophobia, pain and redness.  Respiratory: Negative for cough, chest tightness and shortness of breath.   Cardiovascular: Negative for chest pain and palpitations.  Gastrointestinal: Negative for abdominal pain, constipation, diarrhea, nausea and vomiting.  Endocrine: Negative.   Genitourinary: Negative for dysuria and frequency.  Musculoskeletal: Negative for arthralgias, back pain, joint swelling and neck pain.  Skin: Negative for rash.  Allergic/Immunologic: Negative.   Neurological: Negative for tremors and numbness.  Hematological: Negative for adenopathy. Does not bruise/bleed easily.  Psychiatric/Behavioral: Negative for behavioral problems and sleep disturbance. The patient is not nervous/anxious.     Vital Signs: Temp 99.7 F (37.6 C)   Ht 5' 3"  (1.6 m)   BMI 27.99 kg/m    Observation/Objective:  Well appearing, NAD noted.    Assessment/Plan: 1. Maxillary sinusitis, unspecified chronicity Advised patient to take entire course of antibiotics as prescribed with food. Pt should return to clinic in 7-10 days if symptoms fail to improve or new symptoms develop.  - amoxicillin-clavulanate (AUGMENTIN) 875-125 MG tablet; Take 1 tablet by mouth 2 (two) times daily.  Dispense: 14 tablet; Refill: 0  2. Exposure to COVID-19 virus Encouraged patient to get tested at [facility] drive thru.   General Counseling: Hetty verbalizes understanding of the findings of today's phone visit and agrees with plan of treatment. I have discussed any further diagnostic evaluation that may be needed or ordered today. We also reviewed her medications today. she has been encouraged to call the office with any questions or concerns that  should arise related to todays visit.    No orders of the defined types were placed in this encounter.   Meds ordered this encounter  Medications  . amoxicillin-clavulanate (AUGMENTIN) 875-125 MG tablet    Sig: Take 1 tablet by mouth 2 (two) times daily.    Dispense:  14 tablet    Refill:  0    Time spent: [place] AGNP-C Internal medicine

## 2019-09-06 ENCOUNTER — Other Ambulatory Visit: Payer: Self-pay

## 2019-09-06 NOTE — Progress Notes (Signed)
Patient pre screened for office appointment, no questions or concerns today. Patient reminded of upcoming appointment time and date. 

## 2019-09-09 ENCOUNTER — Inpatient Hospital Stay: Payer: BC Managed Care – PPO | Attending: Oncology

## 2019-09-09 ENCOUNTER — Inpatient Hospital Stay: Payer: BC Managed Care – PPO

## 2019-09-09 ENCOUNTER — Other Ambulatory Visit: Payer: Self-pay

## 2019-09-09 ENCOUNTER — Encounter: Payer: Self-pay | Admitting: Oncology

## 2019-09-09 ENCOUNTER — Inpatient Hospital Stay (HOSPITAL_BASED_OUTPATIENT_CLINIC_OR_DEPARTMENT_OTHER): Payer: BC Managed Care – PPO | Admitting: Oncology

## 2019-09-09 DIAGNOSIS — E669 Obesity, unspecified: Secondary | ICD-10-CM | POA: Insufficient documentation

## 2019-09-09 DIAGNOSIS — E871 Hypo-osmolality and hyponatremia: Secondary | ICD-10-CM | POA: Diagnosis not present

## 2019-09-09 LAB — CBC WITH DIFFERENTIAL/PLATELET
Abs Immature Granulocytes: 0.01 10*3/uL (ref 0.00–0.07)
Basophils Absolute: 0 10*3/uL (ref 0.0–0.1)
Basophils Relative: 0 %
Eosinophils Absolute: 0 10*3/uL (ref 0.0–0.5)
Eosinophils Relative: 0 %
HCT: 41.8 % (ref 36.0–46.0)
Hemoglobin: 14.8 g/dL (ref 12.0–15.0)
Immature Granulocytes: 0 %
Lymphocytes Relative: 46 %
Lymphs Abs: 1.8 10*3/uL (ref 0.7–4.0)
MCH: 33.6 pg (ref 26.0–34.0)
MCHC: 35.4 g/dL (ref 30.0–36.0)
MCV: 94.8 fL (ref 80.0–100.0)
Monocytes Absolute: 0.5 10*3/uL (ref 0.1–1.0)
Monocytes Relative: 11 %
Neutro Abs: 1.8 10*3/uL (ref 1.7–7.7)
Neutrophils Relative %: 43 %
Platelets: 239 10*3/uL (ref 150–400)
RBC: 4.41 MIL/uL (ref 3.87–5.11)
RDW: 11.6 % (ref 11.5–15.5)
WBC: 4.1 10*3/uL (ref 4.0–10.5)
nRBC: 0 % (ref 0.0–0.2)

## 2019-09-09 LAB — COMPREHENSIVE METABOLIC PANEL
ALT: 25 U/L (ref 0–44)
AST: 30 U/L (ref 15–41)
Albumin: 4.6 g/dL (ref 3.5–5.0)
Alkaline Phosphatase: 43 U/L (ref 38–126)
Anion gap: 7 (ref 5–15)
BUN: 8 mg/dL (ref 6–20)
CO2: 28 mmol/L (ref 22–32)
Calcium: 9.3 mg/dL (ref 8.9–10.3)
Chloride: 95 mmol/L — ABNORMAL LOW (ref 98–111)
Creatinine, Ser: 0.63 mg/dL (ref 0.44–1.00)
GFR calc Af Amer: >60 mL/min (ref >60)
GFR calc non Af Amer: >60 mL/min (ref >60)
Glucose, Bld: 104 mg/dL — ABNORMAL HIGH (ref 70–99)
Potassium: 4 mmol/L (ref 3.5–5.1)
Sodium: 130 mmol/L — ABNORMAL LOW (ref 135–145)
Total Bilirubin: 0.9 mg/dL (ref 0.3–1.2)
Total Protein: 7.5 g/dL (ref 6.5–8.1)

## 2019-09-09 LAB — IRON AND TIBC
Iron: 102 ug/dL (ref 28–170)
Saturation Ratios: 23 % (ref 10.4–31.8)
TIBC: 452 ug/dL — ABNORMAL HIGH (ref 250–450)
UIBC: 351 ug/dL

## 2019-09-09 LAB — FERRITIN: Ferritin: 91 ng/mL (ref 11–307)

## 2019-09-09 NOTE — Progress Notes (Signed)
Hematology/Oncology Follow up note [facility] [phone] [phone]   Patient Care Team: Lavera Guise, MD as PCP - General (Internal Medicine)  REFERRING PROVIDER: Lavera Guise, MD REASON FOR VISIT Follow up for treatment of iron overload and hemochromatosis.    HISTORY OF PRESENTING ILLNESS:  Jennifer Barron is a  38 y.o.  female with PMH listed below who was referred to me for evaluation of elevated ferritin. Patient follows up with GYN for IUD placement, and was fine to have abnormal liver function test, mainly elevated transaminitis.  Patient went to primary care physician Dr. Humphrey Rolls And had additional testing.  She was found to have elevated ferritin level at 547, iron saturation 33, ultrasound of abdomen was done on January 12, 2018, which showed upper normal limits spleen size and increased echogenicity of liver questionable fatty liver. Patient was referred to [facility] for further evaluation of elevated ferritin. Currently patient takes spironolactone for acne and this was started by dermatologist a week ago.  She also takes phentermine for obesity, recently started. Patient reports feeling tired fatigue.  Denies any joint pain shortness of breath, lower extremity swelling.  She denies any family history of hemochromatosis.  INTERVAL HISTORY Jennifer Barron is a 38 y.o. female who has above history reviewed by me today presents for follow-up visit for management of iron overload and hemochromatosis. Patient reports feeling well today. She has had blood work done today.  No New complaints.  Chronic fatigue at baseline.  Not worse or better. Chronic joint pain symptom is stable.  Not worse.    Review of Systems  Constitutional: Positive for malaise/fatigue. Negative for chills, fever and weight loss.  HENT: Negative for sore throat.   Eyes: Negative for redness.  Respiratory: Negative for cough, shortness of breath and wheezing.    Cardiovascular: Negative for chest pain, palpitations and leg swelling.  Gastrointestinal: Negative for abdominal pain, blood in stool, nausea and vomiting.  Genitourinary: Negative for dysuria.  Musculoskeletal: Negative for myalgias.  Skin: Negative for rash.  Neurological: Negative for dizziness, tingling and tremors.  Endo/Heme/Allergies: Does not bruise/bleed easily.  Psychiatric/Behavioral: Negative for hallucinations.    MEDICAL HISTORY:  Past Medical History:  Diagnosis Date  . Endometriosis   . Migraine     SURGICAL HISTORY: Past Surgical History:  Procedure Laterality Date  . abdominal laproscopy    . ADENOIDECTOMY     twice  . TONSILLECTOMY Bilateral 1987  . WISDOM TOOTH EXTRACTION  2009    SOCIAL HISTORY: Social History   Socioeconomic History  . Marital status: Married    Spouse name: Not on file  . Number of children: Not on file  . Years of education: Not on file  . Highest education level: Not on file  Occupational History  . Not on file  Social Needs  . Financial resource strain: Not on file  . Food insecurity    Worry: Not on file    Inability: Not on file  . Transportation needs    Medical: Not on file    Non-medical: Not on file  Tobacco Use  . Smoking status: Never Smoker  . Smokeless tobacco: Never Used  Substance and Sexual Activity  . Alcohol use: Yes    Frequency: Never    Comment: social  . Drug use: No  . Sexual activity: Yes  Lifestyle  . Physical activity    Days per week: Not on file    Minutes per session: Not on  file  . Stress: Not on file  Relationships  . Social Herbalist on phone: Not on file    Gets together: Not on file    Attends religious service: Not on file    Active member of club or organization: Not on file    Attends meetings of clubs or organizations: Not on file    Relationship status: Not on file  . Intimate partner violence    Fear of current or ex partner: Not on file    Emotionally  abused: Not on file    Physically abused: Not on file    Forced sexual activity: Not on file  Other Topics Concern  . Not on file  Social History Narrative  . Not on file    FAMILY HISTORY: Family History  Problem Relation Age of Onset  . Ovarian cancer Paternal Grandmother   . Cancer Paternal Grandmother   . Prostate cancer Paternal Grandfather   . Cancer Paternal Grandfather   . Cancer Father     ALLERGIES:  has No Known Allergies.  MEDICATIONS:  Current Outpatient Medications  Medication Sig Dispense Refill  . amoxicillin-clavulanate (AUGMENTIN) 875-125 MG tablet Take 1 tablet by mouth 2 (two) times daily. 14 tablet 0  . cholecalciferol (VITAMIN D) 1000 units tablet Take 1 tablet (1,000 Units total) by mouth daily. 90 tablet 0  . montelukast (SINGULAIR) 10 MG tablet Take 1 tablet (10 mg total) by mouth at bedtime. 30 tablet 3  . progesterone (PROMETRIUM) 200 MG capsule Prometrium 200 mg capsule  Take 1 capsule every day by oral route in the evening for 10 days.    Marland Kitchen spironolactone (ALDACTONE) 50 MG tablet   4   No current facility-administered medications for this visit.      PHYSICAL EXAMINATION: ECOG PERFORMANCE STATUS: 1 - Symptomatic but completely ambulatory Vitals:   09/09/19 1331  BP: (!) 127/92  Pulse: 64  Resp: 18  Temp: (!) 96.7 F (35.9 C)   Filed Weights   09/09/19 1331  Weight: 165 lb 11.2 oz (75.2 kg)    Physical Exam Constitutional:      General: She is not in acute distress. HENT:     Head: Normocephalic and atraumatic.  Eyes:     General: No scleral icterus.    Conjunctiva/sclera: Conjunctivae normal.     Pupils: Pupils are equal, round, and reactive to light.  Neck:     Musculoskeletal: Normal range of motion and neck supple.  Cardiovascular:     Rate and Rhythm: Normal rate and regular rhythm.     Heart sounds: Normal heart sounds. No murmur. No friction rub. No gallop.   Pulmonary:     Effort: Pulmonary effort is normal. No  respiratory distress.     Breath sounds: Normal breath sounds. No wheezing or rales.  Chest:     Chest wall: No tenderness.  Abdominal:     General: Bowel sounds are normal. There is no distension.     Palpations: Abdomen is soft. There is no mass.     Tenderness: There is no abdominal tenderness.  Musculoskeletal: Normal range of motion.        General: No deformity.  Lymphadenopathy:     Cervical: No cervical adenopathy.  Skin:    General: Skin is warm and dry.     Findings: No erythema or rash.  Neurological:     Mental Status: She is alert and oriented to person, place, and time.  Cranial Nerves: No cranial nerve deficit.     Coordination: Coordination normal.     Deep Tendon Reflexes: Reflexes normal.  Psychiatric:        Behavior: Behavior normal.        Thought Content: Thought content normal.      LABORATORY DATA:  I have reviewed the data as listed Lab Results  Component Value Date   WBC 4.1 09/09/2019   HGB 14.8 09/09/2019   HCT 41.8 09/09/2019   MCV 94.8 09/09/2019   PLT 239 09/09/2019   Recent Labs    09/14/18 0811 03/11/19 0825 09/09/19 1316  NA 132* 137 130*  K 5.2* 4.2 4.0  CL 98 103 95*  CO2 25 25 28   GLUCOSE 107* 85 104*  BUN 12 10 8   CREATININE 0.91 0.64 0.63  CALCIUM 10.3 9.1 9.3  GFRNONAA >60 >60 >60  GFRAA >60 >60 >60  PROT 7.9 7.5 7.5  ALBUMIN 4.7 4.4 4.6  AST 65* 36 30  ALT 88* 34 25  ALKPHOS 53 46 43  BILITOT 1.0 0.8 0.9    Hepatitis B surface antigen negative, hepatitis C RNA nondetectable.  01/13/2108 US abdomen showed upper normal limits spleen size and increased echogenicity of liver questionable fatty liver.  ASSESSMENT & PLAN:  1. Hereditary hemochromatosis ([place])   2. Iron overload    #Heterozygous H63D hemochromatosis/iron overload/transaminitis CBC was reviewed with patient.  Stable hemoglobin.  Hemoglobin at 14.8. Iron panel was reviewed.  Results were available after clinic. Patient's iron saturation 23,  ferritin 91, TIBC 452. Transaminitis has completely resolved. I recommend patient to proceed with phlebotomy 544m x 1.  Follow-up in the clinic in 6 months with repeat blood work and possible phlebotomy.  Hyponatremia, sodium level 130.  Patient is asymptomatic.  She is drinking a lot of free water. Discussed with patient to substitute water for free water intake to Gatorade.   All questions were answered. The patient knows to call the clinic with any problems questions or concerns.  Orders Placed This Encounter  Procedures  . CBC with Differential/Platelet    Standing Status:   Future    Standing Expiration Date:   09/08/2020  . Comprehensive metabolic panel    Standing Status:   Future    Standing Expiration Date:   09/08/2020  . Iron and TIBC    Standing Status:   Future    Standing Expiration Date:   09/08/2020  . Ferritin    Standing Status:   Future    Standing Expiration Date:   09/08/2020     ZEarlie Server MD, PhD Hematology Oncology [facility] Systemat ADesoto Regional Health SystemPager- [phone]/16/2020

## 2019-09-12 ENCOUNTER — Other Ambulatory Visit: Payer: Self-pay

## 2019-09-12 DIAGNOSIS — Z20822 Contact with and (suspected) exposure to covid-19: Secondary | ICD-10-CM

## 2019-09-15 LAB — NOVEL CORONAVIRUS, NAA: SARS-CoV-2, NAA: DETECTED — AB

## 2019-10-30 DIAGNOSIS — Z03818 Encounter for observation for suspected exposure to other biological agents ruled out: Secondary | ICD-10-CM | POA: Diagnosis not present

## 2019-11-18 ENCOUNTER — Ambulatory Visit: Payer: BLUE CROSS/BLUE SHIELD | Admitting: Oncology

## 2019-11-18 ENCOUNTER — Other Ambulatory Visit: Payer: BLUE CROSS/BLUE SHIELD

## 2019-12-09 ENCOUNTER — Encounter: Payer: Self-pay | Admitting: Oncology

## 2019-12-09 ENCOUNTER — Telehealth: Payer: Self-pay

## 2019-12-09 NOTE — Telephone Encounter (Signed)
Confirmed appointment on 12/11/2019 and screened for covid. klh

## 2019-12-10 NOTE — Telephone Encounter (Signed)
Per pt request to speak with a supervisor in reference to her appts. Message was sent to [facility]

## 2019-12-10 NOTE — Telephone Encounter (Signed)
Dr. Tasia Catchings would like patient to f/u 02/2020 with labs (iron labs, CMP) prior/MD/poss phlebotomy.  MD visit can be virtual or in person (pt preference).

## 2019-12-11 ENCOUNTER — Ambulatory Visit: Payer: BC Managed Care – PPO | Admitting: Adult Health

## 2019-12-11 ENCOUNTER — Other Ambulatory Visit: Payer: Self-pay

## 2019-12-11 ENCOUNTER — Encounter: Payer: Self-pay | Admitting: Adult Health

## 2019-12-11 VITALS — BP 132/90 | HR 87 | Temp 97.2°F | Resp 16 | Ht 63.0 in | Wt 167.2 lb

## 2019-12-11 DIAGNOSIS — R300 Dysuria: Secondary | ICD-10-CM

## 2019-12-11 DIAGNOSIS — Z0001 Encounter for general adult medical examination with abnormal findings: Secondary | ICD-10-CM | POA: Diagnosis not present

## 2019-12-11 DIAGNOSIS — R5383 Other fatigue: Secondary | ICD-10-CM | POA: Diagnosis not present

## 2019-12-11 MED ORDER — PHENTERMINE HCL 37.5 MG PO CAPS: 37.5000 mg | ORAL_CAPSULE | ORAL | 0 refills | Status: DC

## 2019-12-11 NOTE — Progress Notes (Signed)
[facility] [place],  [address]  Internal MEDICINE  Office Visit Note  Patient Name: Jennifer Barron  [id number]  [id number]  Date of Service: 12/11/2019  Chief Complaint  Patient presents with  . Annual Exam     HPI Pt is here for routine health maintenance examination. She reports she has been doing well, was diagnosed with COVID19 in November, reports she experienced a mild case, only residual symptoms are mild fatigue and hair loss. Followed by hematology routinely for hemochromatosis and iron overload, labs drawn routinely to assess ferritin levels. She would like to start back on Phentermine today as she has been off for about 7 months due to appointment changes. She denies chest pain, palpitations, headaches shortness of breath.  Current Medication: Outpatient Encounter Medications as of 12/11/2019  Medication Sig  . cholecalciferol (VITAMIN D) 1000 units tablet Take 1 tablet (1,000 Units total) by mouth daily.  . progesterone (PROMETRIUM) 200 MG capsule Prometrium 200 mg capsule  Take 1 capsule every day by oral route in the evening for 10 days.  Marland Kitchen spironolactone (ALDACTONE) 50 MG tablet   . montelukast (SINGULAIR) 10 MG tablet Take 1 tablet (10 mg total) by mouth at bedtime. (Patient not taking: Reported on 12/11/2019)  . phentermine 37.5 MG capsule Take 1 capsule (37.5 mg total) by mouth every morning.  . [DISCONTINUED] amoxicillin-clavulanate (AUGMENTIN) 875-125 MG tablet Take 1 tablet by mouth 2 (two) times daily. (Patient not taking: Reported on 12/11/2019)   No facility-administered encounter medications on file as of 12/11/2019.    Surgical History: Past Surgical History:  Procedure Laterality Date  . abdominal laproscopy    . ADENOIDECTOMY     twice  . TONSILLECTOMY Bilateral 1987  . WISDOM TOOTH EXTRACTION  2009    Medical History: Past Medical History:  Diagnosis Date  . Endometriosis   . Migraine     Family  History: Family History  Problem Relation Age of Onset  . Ovarian cancer Paternal Grandmother   . Cancer Paternal Grandmother   . Prostate cancer Paternal Grandfather   . Cancer Paternal Grandfather   . Cancer Father       Review of Systems  Constitutional: Positive for fatigue. Negative for chills and unexpected weight change.  HENT: Negative for congestion, rhinorrhea, sneezing and sore throat.   Eyes: Negative for photophobia, pain and redness.  Respiratory: Negative for cough, chest tightness and shortness of breath.   Cardiovascular: Negative for chest pain and palpitations.  Gastrointestinal: Negative for abdominal pain, constipation, diarrhea, nausea and vomiting.  Endocrine: Negative.   Genitourinary: Negative for dysuria and frequency.  Musculoskeletal: Negative for arthralgias, back pain, joint swelling and neck pain.  Skin: Negative for rash.  Allergic/Immunologic: Negative.   Neurological: Negative for tremors and numbness.  Hematological: Negative for adenopathy. Does not bruise/bleed easily.  Psychiatric/Behavioral: Negative for behavioral problems and sleep disturbance. The patient is not nervous/anxious.      Vital Signs: BP 132/90   Pulse 87   Temp (!) 97.2 F (36.2 C)   Resp 16   Ht 5' 3"  (1.6 m)   Wt 167 lb 3.2 oz (75.8 kg)   SpO2 100%   BMI 29.62 kg/m    Physical Exam Vitals and nursing note reviewed.  Constitutional:      General: She is not in acute distress.    Appearance: She is well-developed. She is not diaphoretic.  HENT:     Head: Normocephalic and atraumatic.  Mouth/Throat:     Pharynx: No oropharyngeal exudate.  Eyes:     Pupils: Pupils are equal, round, and reactive to light.  Neck:     Thyroid: No thyromegaly.     Vascular: No JVD.     Trachea: No tracheal deviation.  Cardiovascular:     Rate and Rhythm: Normal rate and regular rhythm.     Heart sounds: Normal heart sounds. No murmur. No friction rub. No gallop.    Pulmonary:     Effort: Pulmonary effort is normal. No respiratory distress.     Breath sounds: Normal breath sounds. No wheezing or rales.  Chest:     Chest wall: No tenderness.  Abdominal:     Palpations: Abdomen is soft.     Tenderness: There is no abdominal tenderness. There is no guarding.  Musculoskeletal:        General: Normal range of motion.     Cervical back: Normal range of motion and neck supple.  Lymphadenopathy:     Cervical: No cervical adenopathy.  Skin:    General: Skin is warm and dry.  Neurological:     Mental Status: She is alert and oriented to person, place, and time.     Cranial Nerves: No cranial nerve deficit.  Psychiatric:        Behavior: Behavior normal.        Thought Content: Thought content normal.        Judgment: Judgment normal.    LABS: No results found for this or any previous visit (from the past 2160 hour(s)).   Assessment/Plan: 1. Encounter for general adult medical examination with abnormal findings Well appearing 39 year old female, up to date on PHM.  2. Hereditary hemochromatosis ([place]) Followed closely by hematology. Most recent lab work did not warrant any further follow up, will be seen again in 6 months.  3. Morbid obesity ([place]) Request to start back on phentermine as she has noticed a slight increase in her weight since being off. She would like to lose another 20-25 pounds to be at her desired weight. - phentermine 37.5 MG capsule; Take 1 capsule (37.5 mg total) by mouth every morning.  Dispense: 30 capsule; Refill: 0 Obesity Counseling: Risk Assessment: An assessment of behavioral risk factors was made today and includes lack of exercise sedentary lifestyle, lack of portion control and poor dietary habits.  Risk Modification Advice: She was counseled on portion control guidelines. Restricting daily caloric intake to 1600. The detrimental long term effects of obesity on her health and ongoing poor compliance was also discussed  with the patient.  There is a liability release in patients' chart. There has been a 10 minute discussion about the side effects including but not limited to elevated blood pressure, anxiety, lack of sleep and dry mouth. Pt understands and will like to start/continue on appetite suppressant at this time. There will be one month RX given at the time of visit with proper follow up. [place] diet plan with restricted calories is given to the pt. Pt understands and agrees with  plan of treatment  4. Other fatigue Will assess routine lab panels. - Lipid Panel With LDL/HDL Ratio - TSH + free T4 - B12 and Folate Panel - Vitamin D (25 hydroxy)  5. Dysuria - UA/M w/rflx Culture, Routine  General Counseling: Cathryne verbalizes understanding of the findings of todays visit and agrees with plan of treatment. I have discussed any further diagnostic evaluation that may be needed or ordered today. We  also reviewed her medications today. she has been encouraged to call the office with any questions or concerns that should arise related to todays visit.   Orders Placed This Encounter  Procedures  . Lipid Panel With LDL/HDL Ratio  . TSH + free T4  . B12 and Folate Panel  . Vitamin D (25 hydroxy)  . UA/M w/rflx Culture, Routine    Meds ordered this encounter  Medications  . phentermine 37.5 MG capsule    Sig: Take 1 capsule (37.5 mg total) by mouth every morning.    Dispense:  30 capsule    Refill:  0    Time spent: 30 Minutes   This patient was seen by Orson Gear AGNP-C in Collaboration with Dr Lavera Guise as a part of collaborative care agreement    Kendell Bane AGNP-C Internal Medicine

## 2019-12-12 LAB — MICROSCOPIC EXAMINATION: Casts: NONE SEEN /lpf

## 2019-12-12 LAB — UA/M W/RFLX CULTURE, ROUTINE
Bilirubin, UA: NEGATIVE
Glucose, UA: NEGATIVE
Ketones, UA: NEGATIVE
Leukocytes,UA: NEGATIVE
Nitrite, UA: NEGATIVE
Protein,UA: NEGATIVE
RBC, UA: NEGATIVE
Specific Gravity, UA: 1.006 (ref 1.005–1.030)
Urobilinogen, Ur: 1 mg/dL (ref 0.2–1.0)
pH, UA: 8 — ABNORMAL HIGH (ref 5.0–7.5)

## 2019-12-18 DIAGNOSIS — R5383 Other fatigue: Secondary | ICD-10-CM | POA: Diagnosis not present

## 2019-12-19 LAB — TSH+FREE T4
Free T4: 1.59 ng/dL (ref 0.82–1.77)
TSH: 3.96 u[IU]/mL (ref 0.450–4.500)

## 2019-12-19 LAB — LIPID PANEL WITH LDL/HDL RATIO
Cholesterol, Total: 267 mg/dL — ABNORMAL HIGH (ref 100–199)
HDL: 75 mg/dL (ref >39)
LDL Chol Calc (NIH): 174 mg/dL — ABNORMAL HIGH (ref 0–99)
LDL/HDL Ratio: 2.3 ratio (ref 0.0–3.2)
Triglycerides: 105 mg/dL (ref 0–149)
VLDL Cholesterol Cal: 18 mg/dL (ref 5–40)

## 2019-12-19 LAB — B12 AND FOLATE PANEL
Folate: 4.2 ng/mL (ref >3.0)
Vitamin B-12: 225 pg/mL — ABNORMAL LOW (ref 232–1245)

## 2019-12-19 LAB — VITAMIN D 25 HYDROXY (VIT D DEFICIENCY, FRACTURES): Vit D, 25-Hydroxy: 45.9 ng/mL (ref 30.0–100.0)

## 2019-12-23 ENCOUNTER — Telehealth: Payer: Self-pay

## 2019-12-23 NOTE — Telephone Encounter (Signed)
-----   Message from Kendell Bane, NP sent at 12/20/2019  5:12 PM EST ----- Her B12 is low, She should come in for B12 injection weekly x 3 weeks.

## 2019-12-23 NOTE — Telephone Encounter (Signed)
Pt advised labs showed b12 is low need b12 injection weekly for 3 weeks

## 2019-12-27 ENCOUNTER — Telehealth: Payer: Self-pay

## 2019-12-27 NOTE — Telephone Encounter (Signed)
Left a message and asked pt to call back and scedule

## 2019-12-27 NOTE — Telephone Encounter (Signed)
Left a message and asked pt to call back regarding her b12 injections, Jennifer Barron

## 2020-01-01 ENCOUNTER — Other Ambulatory Visit: Payer: Self-pay

## 2020-01-01 ENCOUNTER — Ambulatory Visit (INDEPENDENT_AMBULATORY_CARE_PROVIDER_SITE_OTHER): Payer: BC Managed Care – PPO

## 2020-01-01 DIAGNOSIS — E538 Deficiency of other specified B group vitamins: Secondary | ICD-10-CM | POA: Diagnosis not present

## 2020-01-01 MED ORDER — CYANOCOBALAMIN 1000 MCG/ML IJ SOLN
1000.0000 ug | Freq: Once | INTRAMUSCULAR | Status: AC
  Administered 2020-01-01: 1000 ug via INTRAMUSCULAR

## 2020-01-06 ENCOUNTER — Telehealth: Payer: Self-pay

## 2020-01-06 NOTE — Telephone Encounter (Signed)
Called lmom informing patient of appointment on 01/08/2020. klh

## 2020-01-07 DIAGNOSIS — Z6828 Body mass index (BMI) 28.0-28.9, adult: Secondary | ICD-10-CM | POA: Diagnosis not present

## 2020-01-07 DIAGNOSIS — R399 Unspecified symptoms and signs involving the genitourinary system: Secondary | ICD-10-CM | POA: Diagnosis not present

## 2020-01-07 DIAGNOSIS — R309 Painful micturition, unspecified: Secondary | ICD-10-CM | POA: Diagnosis not present

## 2020-01-07 DIAGNOSIS — Z01419 Encounter for gynecological examination (general) (routine) without abnormal findings: Secondary | ICD-10-CM | POA: Diagnosis not present

## 2020-01-07 DIAGNOSIS — E282 Polycystic ovarian syndrome: Secondary | ICD-10-CM | POA: Diagnosis not present

## 2020-01-08 ENCOUNTER — Ambulatory Visit: Payer: BC Managed Care – PPO | Admitting: Adult Health

## 2020-01-08 ENCOUNTER — Encounter: Payer: Self-pay | Admitting: Adult Health

## 2020-01-08 ENCOUNTER — Other Ambulatory Visit: Payer: Self-pay

## 2020-01-08 VITALS — BP 121/81 | HR 88 | Temp 97.1°F | Resp 16 | Ht 63.0 in | Wt 162.6 lb

## 2020-01-08 DIAGNOSIS — Z6828 Body mass index (BMI) 28.0-28.9, adult: Secondary | ICD-10-CM

## 2020-01-08 DIAGNOSIS — E538 Deficiency of other specified B group vitamins: Secondary | ICD-10-CM | POA: Diagnosis not present

## 2020-01-08 MED ORDER — CYANOCOBALAMIN 1000 MCG/ML IJ SOLN
1000.0000 ug | Freq: Once | INTRAMUSCULAR | Status: AC
  Administered 2020-01-08: 1000 ug via INTRAMUSCULAR

## 2020-01-08 MED ORDER — PHENTERMINE HCL 37.5 MG PO CAPS: 37.5000 mg | ORAL_CAPSULE | ORAL | 0 refills | Status: DC

## 2020-01-08 NOTE — Progress Notes (Signed)
[facility] [place], [place] [address]  Internal MEDICINE  Office Visit Note  Patient Name: Jennifer Barron  [id number]  [id number]  Date of Service: 01/08/2020  Chief Complaint  Patient presents with  . Follow-up    wt loss    HPI  Pt is here for follow up on B12, and weight loss. Overall she is doing very well.  Patient is using phentermine for weight loss.  Since our last visit they have lost 4 pounds.  The patient denies any chest pain, palpitations, shortness of breath, constipation, headaches or any other side effects of the medication.  Patient wishes to continue to use this medication for weight loss at this time.    Current Medication: Outpatient Encounter Medications as of 01/08/2020  Medication Sig  . cholecalciferol (VITAMIN D) 1000 units tablet Take 1 tablet (1,000 Units total) by mouth daily.  . montelukast (SINGULAIR) 10 MG tablet Take 1 tablet (10 mg total) by mouth at bedtime.  . phentermine 37.5 MG capsule Take 1 capsule (37.5 mg total) by mouth every morning.  . progesterone (PROMETRIUM) 200 MG capsule Prometrium 200 mg capsule  Take 1 capsule every day by oral route in the evening for 10 days.  Marland Kitchen spironolactone (ALDACTONE) 50 MG tablet   . [EXPIRED] cyanocobalamin ((VITAMIN B-12)) injection 1,000 mcg    No facility-administered encounter medications on file as of 01/08/2020.    Surgical History: Past Surgical History:  Procedure Laterality Date  . abdominal laproscopy    . ADENOIDECTOMY     twice  . TONSILLECTOMY Bilateral 1987  . WISDOM TOOTH EXTRACTION  2009    Medical History: Past Medical History:  Diagnosis Date  . Endometriosis   . Migraine     Family History: Family History  Problem Relation Age of Onset  . Ovarian cancer Paternal Grandmother   . Cancer Paternal Grandmother   . Prostate cancer Paternal Grandfather   . Cancer Paternal Grandfather   . Cancer Father     Social History   Socioeconomic  History  . Marital status: Married    Spouse name: Not on file  . Number of children: Not on file  . Years of education: Not on file  . Highest education level: Not on file  Occupational History  . Not on file  Tobacco Use  . Smoking status: Never Smoker  . Smokeless tobacco: Never Used  Substance and Sexual Activity  . Alcohol use: Yes    Comment: social  . Drug use: No  . Sexual activity: Yes  Other Topics Concern  . Not on file  Social History Narrative  . Not on file   Social Determinants of Health   Financial Resource Strain:   . Difficulty of Paying Living Expenses:   Food Insecurity:   . Worried About Charity fundraiser in the Last Year:   . Arboriculturist in the Last Year:   Transportation Needs:   . Film/video editor (Medical):   Marland Kitchen Lack of Transportation (Non-Medical):   Physical Activity:   . Days of Exercise per Week:   . Minutes of Exercise per Session:   Stress:   . Feeling of Stress :   Social Connections:   . Frequency of Communication with Friends and Family:   . Frequency of Social Gatherings with Friends and Family:   . Attends Religious Services:   . Active Member of Clubs or Organizations:   . Attends Archivist Meetings:   .  Marital Status:   Intimate Partner Violence:   . Fear of Current or Ex-Partner:   . Emotionally Abused:   Marland Kitchen Physically Abused:   . Sexually Abused:       Review of Systems  Constitutional: Negative for chills, fatigue and unexpected weight change.  HENT: Negative for congestion, rhinorrhea, sneezing and sore throat.   Eyes: Negative for photophobia, pain and redness.  Respiratory: Negative for cough, chest tightness and shortness of breath.   Cardiovascular: Negative for chest pain and palpitations.  Gastrointestinal: Negative for abdominal pain, constipation, diarrhea, nausea and vomiting.  Endocrine: Negative.   Genitourinary: Negative for dysuria and frequency.  Musculoskeletal: Negative for  arthralgias, back pain, joint swelling and neck pain.  Skin: Negative for rash.  Allergic/Immunologic: Negative.   Neurological: Negative for tremors and numbness.  Hematological: Negative for adenopathy. Does not bruise/bleed easily.  Psychiatric/Behavioral: Negative for behavioral problems and sleep disturbance. The patient is not nervous/anxious.     Vital Signs: BP 121/81   Pulse 88   Temp (!) 97.1 F (36.2 C)   Resp 16   Ht 5' 3"  (1.6 m)   Wt 162 lb 9.6 oz (73.8 kg)   SpO2 100%   BMI 28.80 kg/m    Physical Exam Vitals and nursing note reviewed.  Constitutional:      General: She is not in acute distress.    Appearance: She is well-developed. She is not diaphoretic.  HENT:     Head: Normocephalic and atraumatic.     Mouth/Throat:     Pharynx: No oropharyngeal exudate.  Eyes:     Pupils: Pupils are equal, round, and reactive to light.  Neck:     Thyroid: No thyromegaly.     Vascular: No JVD.     Trachea: No tracheal deviation.  Cardiovascular:     Rate and Rhythm: Normal rate and regular rhythm.     Heart sounds: Normal heart sounds. No murmur. No friction rub. No gallop.   Pulmonary:     Effort: Pulmonary effort is normal. No respiratory distress.     Breath sounds: Normal breath sounds. No wheezing or rales.  Chest:     Chest wall: No tenderness.  Abdominal:     Palpations: Abdomen is soft.     Tenderness: There is no abdominal tenderness. There is no guarding.  Musculoskeletal:        General: Normal range of motion.     Cervical back: Normal range of motion and neck supple.  Lymphadenopathy:     Cervical: No cervical adenopathy.  Skin:    General: Skin is warm and dry.  Neurological:     Mental Status: She is alert and oriented to person, place, and time.     Cranial Nerves: No cranial nerve deficit.  Psychiatric:        Behavior: Behavior normal.        Thought Content: Thought content normal.        Judgment: Judgment normal.     Assessment/Plan: 1. B12 deficiency Continue to have b12 injections as planned.  - cyanocobalamin ((VITAMIN B-12)) injection 1,000 mcg  2. BMI 28.0-28.9,adult There is a liability release in patients' chart. There has been a 10 minute discussion about the side effects including but not limited to elevated blood pressure, anxiety, lack of sleep and dry mouth. Pt understands and will like to start/continue on appetite suppressant at this time. There will be one month RX given at the time of visit with proper follow up.  Nova diet plan with restricted calories is given to the pt. Pt understands and agrees with  plan of treatment Obesity Counseling: Risk Assessment: An assessment of behavioral risk factors was made today and includes lack of exercise sedentary lifestyle, lack of portion control and poor dietary habits.  Risk Modification Advice: She was counseled on portion control guidelines. Restricting daily caloric intake to 1600. The detrimental long term effects of obesity on her health and ongoing poor compliance was also discussed with the patient. - phentermine 37.5 MG capsule; Take 1 capsule (37.5 mg total) by mouth every morning.  Dispense: 30 capsule; Refill: 0  3. Hereditary hemochromatosis ([place]) Follow up with hematology as directed.     General Counseling: Jennifer Barron verbalizes understanding of the findings of todays visit and agrees with plan of treatment. I have discussed any further diagnostic evaluation that may be needed or ordered today. We also reviewed her medications today. she has been encouraged to call the office with any questions or concerns that should arise related to todays visit.    No orders of the defined types were placed in this encounter.   Meds ordered this encounter  Medications  . cyanocobalamin ((VITAMIN B-12)) injection 1,000 mcg    Time spent: 30 Minutes   This patient was seen by Orson Gear AGNP-C in Collaboration with Dr Lavera Guise as  a part of collaborative care agreement     Kendell Bane AGNP-C Internal medicine

## 2020-01-13 ENCOUNTER — Encounter: Payer: BC Managed Care – PPO | Admitting: Adult Health

## 2020-01-13 ENCOUNTER — Ambulatory Visit: Payer: BC Managed Care – PPO | Admitting: Adult Health

## 2020-01-16 ENCOUNTER — Other Ambulatory Visit: Payer: Self-pay

## 2020-01-16 ENCOUNTER — Ambulatory Visit (INDEPENDENT_AMBULATORY_CARE_PROVIDER_SITE_OTHER): Payer: BC Managed Care – PPO

## 2020-01-16 DIAGNOSIS — E538 Deficiency of other specified B group vitamins: Secondary | ICD-10-CM

## 2020-01-16 MED ORDER — CYANOCOBALAMIN 1000 MCG/ML IJ SOLN
1000.0000 ug | Freq: Once | INTRAMUSCULAR | Status: AC
  Administered 2020-01-16: 1000 ug via INTRAMUSCULAR

## 2020-01-20 DIAGNOSIS — N879 Dysplasia of cervix uteri, unspecified: Secondary | ICD-10-CM | POA: Diagnosis not present

## 2020-01-20 DIAGNOSIS — N870 Mild cervical dysplasia: Secondary | ICD-10-CM | POA: Diagnosis not present

## 2020-01-20 DIAGNOSIS — Z3202 Encounter for pregnancy test, result negative: Secondary | ICD-10-CM | POA: Diagnosis not present

## 2020-01-28 DIAGNOSIS — N870 Mild cervical dysplasia: Secondary | ICD-10-CM | POA: Insufficient documentation

## 2020-02-03 ENCOUNTER — Telehealth: Payer: Self-pay

## 2020-02-03 NOTE — Telephone Encounter (Signed)
Called lmom informing patient of appointment on 02/04/2020. klh

## 2020-02-05 ENCOUNTER — Ambulatory Visit: Payer: BC Managed Care – PPO | Admitting: Adult Health

## 2020-02-10 ENCOUNTER — Telehealth: Payer: Self-pay

## 2020-02-10 NOTE — Telephone Encounter (Signed)
Confirmed appointment on 02/12/2020 and screened for covid.klh

## 2020-02-12 ENCOUNTER — Encounter: Payer: Self-pay | Admitting: Adult Health

## 2020-02-12 ENCOUNTER — Ambulatory Visit (INDEPENDENT_AMBULATORY_CARE_PROVIDER_SITE_OTHER): Payer: BC Managed Care – PPO | Admitting: Adult Health

## 2020-02-12 ENCOUNTER — Other Ambulatory Visit: Payer: Self-pay

## 2020-02-12 VITALS — BP 119/86 | HR 75 | Temp 97.5°F | Resp 16 | Ht 63.0 in | Wt 159.6 lb

## 2020-02-12 DIAGNOSIS — Z6828 Body mass index (BMI) 28.0-28.9, adult: Secondary | ICD-10-CM | POA: Diagnosis not present

## 2020-02-12 DIAGNOSIS — E538 Deficiency of other specified B group vitamins: Secondary | ICD-10-CM | POA: Diagnosis not present

## 2020-02-12 MED ORDER — PHENTERMINE HCL 37.5 MG PO CAPS: 37.5000 mg | ORAL_CAPSULE | ORAL | 0 refills | Status: DC

## 2020-02-12 NOTE — Progress Notes (Signed)
[facility], [facility] [place], [place] [address]  Internal MEDICINE  Office Visit Note  Patient Name: Jennifer Barron  [id number]  [id number]  Date of Service: 02/12/2020  Chief Complaint  Patient presents with  . Follow-up    weightloss    HPI  Pt is here for follow up on weight loss.  Patient is using phentermine for weight loss.  Since our last visit they have lost 3 pounds.  The patient denies any chest pain, palpitations, shortness of breath, constipation, headaches or any other side effects of the medication.  Patient wishes to continue to use this medication for weight loss at this time.     Current Medication: Outpatient Encounter Medications as of 02/12/2020  Medication Sig  . cholecalciferol (VITAMIN D) 1000 units tablet Take 1 tablet (1,000 Units total) by mouth daily.  . montelukast (SINGULAIR) 10 MG tablet Take 1 tablet (10 mg total) by mouth at bedtime.  . phentermine 37.5 MG capsule Take 1 capsule (37.5 mg total) by mouth every morning.  . progesterone (PROMETRIUM) 200 MG capsule Prometrium 200 mg capsule  Take 1 capsule every day by oral route in the evening for 10 days.  Marland Kitchen spironolactone (ALDACTONE) 50 MG tablet   . [DISCONTINUED] phentermine 37.5 MG capsule Take 1 capsule (37.5 mg total) by mouth every morning.   No facility-administered encounter medications on file as of 02/12/2020.    Surgical History: Past Surgical History:  Procedure Laterality Date  . abdominal laproscopy    . ADENOIDECTOMY     twice  . TONSILLECTOMY Bilateral 1987  . WISDOM TOOTH EXTRACTION  2009    Medical History: Past Medical History:  Diagnosis Date  . Endometriosis   . Migraine     Family History: Family History  Problem Relation Age of Onset  . Ovarian cancer Paternal Grandmother   . Cancer Paternal Grandmother   . Prostate cancer Paternal Grandfather   . Cancer Paternal Grandfather   . Cancer Father     Social History   Socioeconomic  History  . Marital status: Married    Spouse name: Not on file  . Number of children: Not on file  . Years of education: Not on file  . Highest education level: Not on file  Occupational History  . Not on file  Tobacco Use  . Smoking status: Never Smoker  . Smokeless tobacco: Never Used  Substance and Sexual Activity  . Alcohol use: Yes    Comment: social  . Drug use: No  . Sexual activity: Yes  Other Topics Concern  . Not on file  Social History Narrative  . Not on file   Social Determinants of Health   Financial Resource Strain:   . Difficulty of Paying Living Expenses:   Food Insecurity:   . Worried About Charity fundraiser in the Last Year:   . Arboriculturist in the Last Year:   Transportation Needs:   . Film/video editor (Medical):   Marland Kitchen Lack of Transportation (Non-Medical):   Physical Activity:   . Days of Exercise per Week:   . Minutes of Exercise per Session:   Stress:   . Feeling of Stress :   Social Connections:   . Frequency of Communication with Friends and Family:   . Frequency of Social Gatherings with Friends and Family:   . Attends Religious Services:   . Active Member of Clubs or Organizations:   . Attends Archivist Meetings:   .  Marital Status:   Intimate Partner Violence:   . Fear of Current or Ex-Partner:   . Emotionally Abused:   Marland Kitchen Physically Abused:   . Sexually Abused:       Review of Systems  Constitutional: Negative for chills, fatigue and unexpected weight change.  HENT: Negative for congestion, rhinorrhea, sneezing and sore throat.   Eyes: Negative for photophobia, pain and redness.  Respiratory: Negative for cough, chest tightness and shortness of breath.   Cardiovascular: Negative for chest pain and palpitations.  Gastrointestinal: Negative for abdominal pain, constipation, diarrhea, nausea and vomiting.  Endocrine: Negative.   Genitourinary: Negative for dysuria and frequency.  Musculoskeletal: Negative for  arthralgias, back pain, joint swelling and neck pain.  Skin: Negative for rash.  Allergic/Immunologic: Negative.   Neurological: Negative for tremors and numbness.  Hematological: Negative for adenopathy. Does not bruise/bleed easily.  Psychiatric/Behavioral: Negative for behavioral problems and sleep disturbance. The patient is not nervous/anxious.     Vital Signs: BP 119/86   Pulse 75   Temp (!) 97.5 F (36.4 C)   Resp 16   Ht 5' 3"  (1.6 m)   Wt 159 lb 9.6 oz (72.4 kg)   SpO2 98%   BMI 28.27 kg/m    Physical Exam Vitals and nursing note reviewed.  Constitutional:      General: She is not in acute distress.    Appearance: She is well-developed. She is not diaphoretic.  HENT:     Head: Normocephalic and atraumatic.     Mouth/Throat:     Pharynx: No oropharyngeal exudate.  Eyes:     Pupils: Pupils are equal, round, and reactive to light.  Neck:     Thyroid: No thyromegaly.     Vascular: No JVD.     Trachea: No tracheal deviation.  Cardiovascular:     Rate and Rhythm: Normal rate and regular rhythm.     Heart sounds: Normal heart sounds. No murmur. No friction rub. No gallop.   Pulmonary:     Effort: Pulmonary effort is normal. No respiratory distress.     Breath sounds: Normal breath sounds. No wheezing or rales.  Chest:     Chest wall: No tenderness.  Abdominal:     Palpations: Abdomen is soft.     Tenderness: There is no abdominal tenderness. There is no guarding.  Musculoskeletal:        General: Normal range of motion.     Cervical back: Normal range of motion and neck supple.  Lymphadenopathy:     Cervical: No cervical adenopathy.  Skin:    General: Skin is warm and dry.  Neurological:     Mental Status: She is alert and oriented to person, place, and time.     Cranial Nerves: No cranial nerve deficit.  Psychiatric:        Behavior: Behavior normal.        Thought Content: Thought content normal.        Judgment: Judgment normal.      Assessment/Plan: 1. B12 deficiency Continue with B12 follow.   2. BMI 28.0-28.9,adult Obesity Counseling: Risk Assessment: An assessment of behavioral risk factors was made today and includes lack of exercise sedentary lifestyle, lack of portion control and poor dietary habits.  Risk Modification Advice: She was counseled on portion control guidelines. Restricting daily caloric intake to 1800. The detrimental long term effects of obesity on her health and ongoing poor compliance was also discussed with the patient.  3. Hereditary hemochromatosis ([place]) Follow up  with hematology as discussed.   General Counseling: Attie verbalizes understanding of the findings of todays visit and agrees with plan of treatment. I have discussed any further diagnostic evaluation that may be needed or ordered today. We also reviewed her medications today. she has been encouraged to call the office with any questions or concerns that should arise related to todays visit.    No orders of the defined types were placed in this encounter.   Meds ordered this encounter  Medications  . phentermine 37.5 MG capsule    Sig: Take 1 capsule (37.5 mg total) by mouth every morning.    Dispense:  30 capsule    Refill:  0    Time spent: 25 Minutes   This patient was seen by Orson Gear AGNP-C in Collaboration with Dr Lavera Guise as a part of collaborative care agreement     Kendell Bane AGNP-C Internal medicine

## 2020-03-09 ENCOUNTER — Telehealth: Payer: Self-pay

## 2020-03-09 NOTE — Telephone Encounter (Signed)
Confirmed appointment on 03/11/2020 and screened for covid. klh

## 2020-03-11 ENCOUNTER — Ambulatory Visit: Payer: BC Managed Care – PPO | Admitting: Adult Health

## 2020-03-11 ENCOUNTER — Encounter: Payer: Self-pay | Admitting: Adult Health

## 2020-03-11 ENCOUNTER — Other Ambulatory Visit: Payer: Self-pay

## 2020-03-11 VITALS — BP 121/87 | HR 91 | Temp 97.3°F | Resp 16 | Ht 63.0 in | Wt 163.8 lb

## 2020-03-11 DIAGNOSIS — Z6828 Body mass index (BMI) 28.0-28.9, adult: Secondary | ICD-10-CM | POA: Diagnosis not present

## 2020-03-11 DIAGNOSIS — E538 Deficiency of other specified B group vitamins: Secondary | ICD-10-CM | POA: Diagnosis not present

## 2020-03-11 MED ORDER — CYANOCOBALAMIN 1000 MCG/ML IJ SOLN
1000.0000 ug | Freq: Once | INTRAMUSCULAR | Status: AC
  Administered 2020-03-11: 1000 ug via INTRAMUSCULAR

## 2020-03-11 MED ORDER — PHENTERMINE HCL 37.5 MG PO CAPS: 37.5000 mg | ORAL_CAPSULE | ORAL | 0 refills | Status: DC

## 2020-03-11 NOTE — Progress Notes (Signed)
[facility] [place], [place] [address]  Internal MEDICINE  Office Visit Note  Patient Name: Jennifer Barron  [id number]  [id number]  Date of Service: 03/11/2020  Chief Complaint  Patient presents with  . Follow-up    HPI  Pt is here for follow up on weight loss. She has gained 4 pounds since our last visit.  She has been dealing with her dad who has been diagnosed with cancer and had extensive surgery.      Current Medication: Outpatient Encounter Medications as of 03/11/2020  Medication Sig  . cholecalciferol (VITAMIN D) 1000 units tablet Take 1 tablet (1,000 Units total) by mouth daily.  . montelukast (SINGULAIR) 10 MG tablet Take 1 tablet (10 mg total) by mouth at bedtime.  . phentermine 37.5 MG capsule Take 1 capsule (37.5 mg total) by mouth every morning.  . progesterone (PROMETRIUM) 200 MG capsule Prometrium 200 mg capsule  Take 1 capsule every day by oral route in the evening for 10 days.  Marland Kitchen spironolactone (ALDACTONE) 50 MG tablet   . [DISCONTINUED] phentermine 37.5 MG capsule Take 1 capsule (37.5 mg total) by mouth every morning.   No facility-administered encounter medications on file as of 03/11/2020.    Surgical History: Past Surgical History:  Procedure Laterality Date  . abdominal laproscopy    . ADENOIDECTOMY     twice  . TONSILLECTOMY Bilateral 1987  . WISDOM TOOTH EXTRACTION  2009    Medical History: Past Medical History:  Diagnosis Date  . Endometriosis   . Migraine     Family History: Family History  Problem Relation Age of Onset  . Ovarian cancer Paternal Grandmother   . Cancer Paternal Grandmother   . Prostate cancer Paternal Grandfather   . Cancer Paternal Grandfather   . Cancer Father     Social History   Socioeconomic History  . Marital status: Married    Spouse name: Not on file  . Number of children: Not on file  . Years of education: Not on file  . Highest education level: Not on file   Occupational History  . Not on file  Tobacco Use  . Smoking status: Never Smoker  . Smokeless tobacco: Never Used  Substance and Sexual Activity  . Alcohol use: Yes    Comment: social  . Drug use: No  . Sexual activity: Yes  Other Topics Concern  . Not on file  Social History Narrative  . Not on file   Social Determinants of Health   Financial Resource Strain:   . Difficulty of Paying Living Expenses:   Food Insecurity:   . Worried About Charity fundraiser in the Last Year:   . Arboriculturist in the Last Year:   Transportation Needs:   . Film/video editor (Medical):   Marland Kitchen Lack of Transportation (Non-Medical):   Physical Activity:   . Days of Exercise per Week:   . Minutes of Exercise per Session:   Stress:   . Feeling of Stress :   Social Connections:   . Frequency of Communication with Friends and Family:   . Frequency of Social Gatherings with Friends and Family:   . Attends Religious Services:   . Active Member of Clubs or Organizations:   . Attends Archivist Meetings:   Marland Kitchen Marital Status:   Intimate Partner Violence:   . Fear of Current or Ex-Partner:   . Emotionally Abused:   Marland Kitchen Physically Abused:   . Sexually  Abused:       Review of Systems  Constitutional: Negative for chills, fatigue and unexpected weight change.  HENT: Negative for congestion, rhinorrhea, sneezing and sore throat.   Eyes: Negative for photophobia, pain and redness.  Respiratory: Negative for cough, chest tightness and shortness of breath.   Cardiovascular: Negative for chest pain and palpitations.  Gastrointestinal: Negative for abdominal pain, constipation, diarrhea, nausea and vomiting.  Endocrine: Negative.   Genitourinary: Negative for dysuria and frequency.  Musculoskeletal: Negative for arthralgias, back pain, joint swelling and neck pain.  Skin: Negative for rash.  Allergic/Immunologic: Negative.   Neurological: Negative for tremors and numbness.   Hematological: Negative for adenopathy. Does not bruise/bleed easily.  Psychiatric/Behavioral: Negative for behavioral problems and sleep disturbance. The patient is not nervous/anxious.     Vital Signs: BP 121/87   Pulse 91   Temp (!) 97.3 F (36.3 C)   Resp 16   Ht 5' 3"  (1.6 m)   Wt 163 lb 12.8 oz (74.3 kg)   SpO2 99%   BMI 29.02 kg/m    Physical Exam Vitals and nursing note reviewed.  Constitutional:      General: She is not in acute distress.    Appearance: She is well-developed. She is not diaphoretic.  HENT:     Head: Normocephalic and atraumatic.     Mouth/Throat:     Pharynx: No oropharyngeal exudate.  Eyes:     Pupils: Pupils are equal, round, and reactive to light.  Neck:     Thyroid: No thyromegaly.     Vascular: No JVD.     Trachea: No tracheal deviation.  Cardiovascular:     Rate and Rhythm: Normal rate and regular rhythm.     Heart sounds: Normal heart sounds. No murmur. No friction rub. No gallop.   Pulmonary:     Effort: Pulmonary effort is normal. No respiratory distress.     Breath sounds: Normal breath sounds. No wheezing or rales.  Chest:     Chest wall: No tenderness.  Abdominal:     Palpations: Abdomen is soft.     Tenderness: There is no abdominal tenderness. There is no guarding.  Musculoskeletal:        General: Normal range of motion.     Cervical back: Normal range of motion and neck supple.  Lymphadenopathy:     Cervical: No cervical adenopathy.  Skin:    General: Skin is warm and dry.  Neurological:     Mental Status: She is alert and oriented to person, place, and time.     Cranial Nerves: No cranial nerve deficit.  Psychiatric:        Behavior: Behavior normal.        Thought Content: Thought content normal.        Judgment: Judgment normal.    Assessment/Plan: 1. B12 deficiency Continue to replace B12  2. BMI 28.0-28.9,adult Obesity Counseling: Risk Assessment: An assessment of behavioral risk factors was made today  and includes lack of exercise sedentary lifestyle, lack of portion control and poor dietary habits.  Risk Modification Advice: She was counseled on portion control guidelines. Restricting daily caloric intake to 1800. The detrimental long term effects of obesity on her health and ongoing poor compliance was also discussed with the patient.  - phentermine 37.5 MG capsule; Take 1 capsule (37.5 mg total) by mouth every morning.  Dispense: 30 capsule; Refill: 0  3. Hereditary hemochromatosis ([place]) Stable, continue to follow up with hematology as scheduled.  General Counseling: Lielle verbalizes understanding of the findings of todays visit and agrees with plan of treatment. I have discussed any further diagnostic evaluation that may be needed or ordered today. We also reviewed her medications today. she has been encouraged to call the office with any questions or concerns that should arise related to todays visit.    No orders of the defined types were placed in this encounter.   Meds ordered this encounter  Medications  . phentermine 37.5 MG capsule    Sig: Take 1 capsule (37.5 mg total) by mouth every morning.    Dispense:  30 capsule    Refill:  0    Time spent: 30 Minutes   This patient was seen by Orson Gear AGNP-C in Collaboration with Dr Lavera Guise as a part of collaborative care agreement     Kendell Bane AGNP-C Internal medicine

## 2020-04-10 ENCOUNTER — Telehealth: Payer: Self-pay

## 2020-04-10 NOTE — Telephone Encounter (Signed)
Called lmom informing patient of appointment on 04/14/2020. klh

## 2020-04-14 ENCOUNTER — Ambulatory Visit: Payer: BC Managed Care – PPO | Admitting: Adult Health

## 2020-04-14 ENCOUNTER — Other Ambulatory Visit: Payer: Self-pay

## 2020-04-14 ENCOUNTER — Encounter: Payer: Self-pay | Admitting: Adult Health

## 2020-04-14 VITALS — BP 121/75 | HR 76 | Temp 97.5°F | Resp 16 | Ht 63.0 in | Wt 169.0 lb

## 2020-04-14 DIAGNOSIS — R7989 Other specified abnormal findings of blood chemistry: Secondary | ICD-10-CM

## 2020-04-14 DIAGNOSIS — E538 Deficiency of other specified B group vitamins: Secondary | ICD-10-CM | POA: Diagnosis not present

## 2020-04-14 DIAGNOSIS — R5383 Other fatigue: Secondary | ICD-10-CM | POA: Diagnosis not present

## 2020-04-14 DIAGNOSIS — Z6829 Body mass index (BMI) 29.0-29.9, adult: Secondary | ICD-10-CM

## 2020-04-14 NOTE — Progress Notes (Signed)
[facility] [place], [place] [address]  Internal MEDICINE  Office Visit Note  Patient Name: Jennifer Barron  [id number]  [id number]  Date of Service: 04/14/2020  Chief Complaint  Patient presents with  . Follow-up    wt loss  . Fatigue    no energy    HPI  Pt is here for follow up on fatigue and weight loss. Patient is using phentermine for weight loss.  Since our last visit they have gained 6  pounds.  The patient denies any chest pain, palpitations, shortness of breath, constipation, headaches or any other side effects of the medication.  She has ongoing fatigue, and feels like her ferritin level may be out of normal range again.    Current Medication: Outpatient Encounter Medications as of 04/14/2020  Medication Sig  . cholecalciferol (VITAMIN D) 1000 units tablet Take 1 tablet (1,000 Units total) by mouth daily.  . montelukast (SINGULAIR) 10 MG tablet Take 1 tablet (10 mg total) by mouth at bedtime.  . phentermine 37.5 MG capsule Take 1 capsule (37.5 mg total) by mouth every morning.  . progesterone (PROMETRIUM) 200 MG capsule Prometrium 200 mg capsule  Take 1 capsule every day by oral route in the evening for 10 days.  Marland Kitchen spironolactone (ALDACTONE) 50 MG tablet    No facility-administered encounter medications on file as of 04/14/2020.    Surgical History: Past Surgical History:  Procedure Laterality Date  . abdominal laproscopy    . ADENOIDECTOMY     twice  . TONSILLECTOMY Bilateral 1987  . WISDOM TOOTH EXTRACTION  2009    Medical History: Past Medical History:  Diagnosis Date  . Endometriosis   . Migraine     Family History: Family History  Problem Relation Age of Onset  . Ovarian cancer Paternal Grandmother   . Cancer Paternal Grandmother   . Prostate cancer Paternal Grandfather   . Cancer Paternal Grandfather   . Cancer Father     Social History   Socioeconomic History  . Marital status: Married    Spouse name: Not  on file  . Number of children: Not on file  . Years of education: Not on file  . Highest education level: Not on file  Occupational History  . Not on file  Tobacco Use  . Smoking status: Never Smoker  . Smokeless tobacco: Never Used  Vaping Use  . Vaping Use: Never used  Substance and Sexual Activity  . Alcohol use: Yes    Comment: social  . Drug use: No  . Sexual activity: Yes  Other Topics Concern  . Not on file  Social History Narrative  . Not on file   Social Determinants of Health   Financial Resource Strain:   . Difficulty of Paying Living Expenses:   Food Insecurity:   . Worried About Charity fundraiser in the Last Year:   . Arboriculturist in the Last Year:   Transportation Needs:   . Film/video editor (Medical):   Marland Kitchen Lack of Transportation (Non-Medical):   Physical Activity:   . Days of Exercise per Week:   . Minutes of Exercise per Session:   Stress:   . Feeling of Stress :   Social Connections:   . Frequency of Communication with Friends and Family:   . Frequency of Social Gatherings with Friends and Family:   . Attends Religious Services:   . Active Member of Clubs or Organizations:   . Attends Club  or Organization Meetings:   Marland Kitchen Marital Status:   Intimate Partner Violence:   . Fear of Current or Ex-Partner:   . Emotionally Abused:   Marland Kitchen Physically Abused:   . Sexually Abused:       Review of Systems  Constitutional: Negative for chills, fatigue and unexpected weight change.  HENT: Negative for congestion, rhinorrhea, sneezing and sore throat.   Eyes: Negative for photophobia, pain and redness.  Respiratory: Negative for cough, chest tightness and shortness of breath.   Cardiovascular: Negative for chest pain and palpitations.  Gastrointestinal: Negative for abdominal pain, constipation, diarrhea, nausea and vomiting.  Endocrine: Negative.   Genitourinary: Negative for dysuria and frequency.  Musculoskeletal: Negative for arthralgias, back  pain, joint swelling and neck pain.  Skin: Negative for rash.  Allergic/Immunologic: Negative.   Neurological: Negative for tremors and numbness.  Hematological: Negative for adenopathy. Does not bruise/bleed easily.  Psychiatric/Behavioral: Negative for behavioral problems and sleep disturbance. The patient is not nervous/anxious.     Vital Signs: BP 121/75   Pulse 76   Temp (!) 97.5 F (36.4 C)   Resp 16   Ht 5' 3"  (1.6 m)   Wt 169 lb (76.7 kg)   SpO2 99%   BMI 29.94 kg/m    Physical Exam Vitals and nursing note reviewed.  Constitutional:      General: She is not in acute distress.    Appearance: She is well-developed. She is not diaphoretic.  HENT:     Head: Normocephalic and atraumatic.     Mouth/Throat:     Pharynx: No oropharyngeal exudate.  Eyes:     Pupils: Pupils are equal, round, and reactive to light.  Neck:     Thyroid: No thyromegaly.     Vascular: No JVD.     Trachea: No tracheal deviation.  Cardiovascular:     Rate and Rhythm: Normal rate and regular rhythm.     Heart sounds: Normal heart sounds. No murmur heard.  No friction rub. No gallop.   Pulmonary:     Effort: Pulmonary effort is normal. No respiratory distress.     Breath sounds: Normal breath sounds. No wheezing or rales.  Chest:     Chest wall: No tenderness.  Abdominal:     Palpations: Abdomen is soft.     Tenderness: There is no abdominal tenderness. There is no guarding.  Musculoskeletal:        General: Normal range of motion.     Cervical back: Normal range of motion and neck supple.  Lymphadenopathy:     Cervical: No cervical adenopathy.  Skin:    General: Skin is warm and dry.  Neurological:     Mental Status: She is alert and oriented to person, place, and time.     Cranial Nerves: No cranial nerve deficit.  Psychiatric:        Behavior: Behavior normal.        Thought Content: Thought content normal.        Judgment: Judgment normal.    Assessment/Plan: 1. Other  fatigue Check fatigue labs - Fe+TIBC+Fer - B12 and Folate Panel - Vitamin D (25 hydroxy) - TSH + free T4  2. B12 deficiency - B12 and Folate Panel  3. Hereditary hemochromatosis ([place]) Stable - Fe+TIBC+Fer - B12 and Folate Panel - Vitamin D (25 hydroxy) - TSH + free T4  4. Elevated ferritin - Fe+TIBC+Fer  5. BMI 29.0-29.9,adult Patient will take a break from Phentermine, will get labs, and reevaluate in a  few weeks.   General Counseling: Benedetta verbalizes understanding of the findings of todays visit and agrees with plan of treatment. I have discussed any further diagnostic evaluation that may be needed or ordered today. We also reviewed her medications today. she has been encouraged to call the office with any questions or concerns that should arise related to todays visit.    Orders Placed This Encounter  Procedures  . Fe+TIBC+Fer  . B12 and Folate Panel  . Vitamin D (25 hydroxy)  . TSH + free T4    No orders of the defined types were placed in this encounter.   Time spent: 30 Minutes   This patient was seen by Orson Gear AGNP-C in Collaboration with Dr Lavera Guise as a part of collaborative care agreement     Kendell Bane AGNP-C Internal medicine

## 2020-04-21 DIAGNOSIS — R7989 Other specified abnormal findings of blood chemistry: Secondary | ICD-10-CM | POA: Diagnosis not present

## 2020-04-21 DIAGNOSIS — R5383 Other fatigue: Secondary | ICD-10-CM | POA: Diagnosis not present

## 2020-04-21 DIAGNOSIS — E538 Deficiency of other specified B group vitamins: Secondary | ICD-10-CM | POA: Diagnosis not present

## 2020-04-22 LAB — B12 AND FOLATE PANEL
Folate: 4.6 ng/mL (ref >3.0)
Vitamin B-12: 211 pg/mL — ABNORMAL LOW (ref 232–1245)

## 2020-04-22 LAB — IRON,TIBC AND FERRITIN PANEL
Ferritin: 103 ng/mL (ref 15–150)
Iron Saturation: 43 % (ref 15–55)
Iron: 152 ug/dL (ref 27–159)
Total Iron Binding Capacity: 356 ug/dL (ref 250–450)
UIBC: 204 ug/dL (ref 131–425)

## 2020-04-22 LAB — VITAMIN D 25 HYDROXY (VIT D DEFICIENCY, FRACTURES): Vit D, 25-Hydroxy: 36.1 ng/mL (ref 30.0–100.0)

## 2020-04-22 LAB — TSH+FREE T4
Free T4: 1.11 ng/dL (ref 0.82–1.77)
TSH: 2.77 u[IU]/mL (ref 0.450–4.500)

## 2020-04-23 ENCOUNTER — Telehealth: Payer: Self-pay

## 2020-04-23 NOTE — Telephone Encounter (Signed)
Called lmom informing patient of appointment on 0706/2021. klh

## 2020-04-28 ENCOUNTER — Ambulatory Visit: Payer: BC Managed Care – PPO | Admitting: Adult Health

## 2020-06-02 ENCOUNTER — Telehealth: Payer: Self-pay

## 2020-06-02 NOTE — Telephone Encounter (Signed)
Confirmed and screened for 06-04-20 ov.

## 2020-06-02 NOTE — Telephone Encounter (Signed)
Confirmed office visit on 8/12

## 2020-06-04 ENCOUNTER — Ambulatory Visit: Payer: BC Managed Care – PPO | Admitting: Adult Health

## 2020-06-10 ENCOUNTER — Ambulatory Visit: Payer: BC Managed Care – PPO | Admitting: Internal Medicine

## 2020-06-16 ENCOUNTER — Ambulatory Visit: Payer: BC Managed Care – PPO | Admitting: Hospice and Palliative Medicine

## 2020-06-16 ENCOUNTER — Other Ambulatory Visit: Payer: Self-pay

## 2020-06-16 ENCOUNTER — Encounter: Payer: Self-pay | Admitting: Internal Medicine

## 2020-06-16 DIAGNOSIS — E785 Hyperlipidemia, unspecified: Secondary | ICD-10-CM

## 2020-06-16 DIAGNOSIS — E538 Deficiency of other specified B group vitamins: Secondary | ICD-10-CM

## 2020-06-16 DIAGNOSIS — Z79899 Other long term (current) drug therapy: Secondary | ICD-10-CM | POA: Diagnosis not present

## 2020-06-16 MED ORDER — CYANOCOBALAMIN 1000 MCG/ML IJ SOLN
1000.0000 ug | Freq: Once | INTRAMUSCULAR | Status: AC
  Administered 2020-06-16: 1000 ug via INTRAMUSCULAR

## 2020-06-16 NOTE — Progress Notes (Signed)
[facility] [place], [place] [address]  Internal MEDICINE  Office Visit Note  Patient Name: Jennifer Barron  [id number]  [id number]  Date of Service: 06/16/2020  Chief Complaint  Patient presents with  . Follow-up    weight gain, blood work results  . Quality Metric Gaps    hepC, TDAP    HPI Patient is here for routine follow-up. Discussed her recent labs with her.  Recheck of anemia profile for monitoring of her iron and ferritin levels due to her hemochromatosis. Iron and ferritin levels normal and remain stable at this time.  Vitamin B12 levels 211 at this time. Discussed with the effects of Vitamin B12 deficiency such as fatigue, neuropathy and decreased mental clarity. Has been on B12 supplementations in the past. Thyroid levels normal and stable at this time. She remains off phentermine at this time. She has noticed her weight starting to slowly increase since stopping but she relates this to the stress she is under. She is having stress from dealing with ill parents and husband. Has currently stopped Spironlactone for hair loss as she feels it was not helping and also relates her hair loss to underlying stress. BP and HR controlled today.  Current Medication: Outpatient Encounter Medications as of 06/16/2020  Medication Sig  . cholecalciferol (VITAMIN D) 1000 units tablet Take 1 tablet (1,000 Units total) by mouth daily.  . montelukast (SINGULAIR) 10 MG tablet Take 1 tablet (10 mg total) by mouth at bedtime.  . phentermine 37.5 MG capsule Take 1 capsule (37.5 mg total) by mouth every morning.  . progesterone (PROMETRIUM) 200 MG capsule Prometrium 200 mg capsule  Take 1 capsule every day by oral route in the evening for 10 days.  Marland Kitchen spironolactone (ALDACTONE) 50 MG tablet   . [EXPIRED] cyanocobalamin ((VITAMIN B-12)) injection 1,000 mcg    No facility-administered encounter medications on file as of 06/16/2020.    Surgical History: Past  Surgical History:  Procedure Laterality Date  . abdominal laproscopy    . ADENOIDECTOMY     twice  . TONSILLECTOMY Bilateral 1987  . WISDOM TOOTH EXTRACTION  2009    Medical History: Past Medical History:  Diagnosis Date  . Endometriosis   . Migraine     Family History: Family History  Problem Relation Age of Onset  . Ovarian cancer Paternal Grandmother   . Cancer Paternal Grandmother   . Prostate cancer Paternal Grandfather   . Cancer Paternal Grandfather   . Cancer Father     Social History   Socioeconomic History  . Marital status: Married    Spouse name: Not on file  . Number of children: Not on file  . Years of education: Not on file  . Highest education level: Not on file  Occupational History  . Not on file  Tobacco Use  . Smoking status: Never Smoker  . Smokeless tobacco: Never Used  Vaping Use  . Vaping Use: Never used  Substance and Sexual Activity  . Alcohol use: Yes    Comment: social  . Drug use: No  . Sexual activity: Yes  Other Topics Concern  . Not on file  Social History Narrative  . Not on file   Social Determinants of Health   Financial Resource Strain:   . Difficulty of Paying Living Expenses: Not on file  Food Insecurity:   . Worried About Charity fundraiser in the Last Year: Not on file  . Ran Out of Food in the  Last Year: Not on file  Transportation Needs:   . Lack of Transportation (Medical): Not on file  . Lack of Transportation (Non-Medical): Not on file  Physical Activity:   . Days of Exercise per Week: Not on file  . Minutes of Exercise per Session: Not on file  Stress:   . Feeling of Stress : Not on file  Social Connections:   . Frequency of Communication with Friends and Family: Not on file  . Frequency of Social Gatherings with Friends and Family: Not on file  . Attends Religious Services: Not on file  . Active Member of Clubs or Organizations: Not on file  . Attends Archivist Meetings: Not on file  .  Marital Status: Not on file  Intimate Partner Violence:   . Fear of Current or Ex-Partner: Not on file  . Emotionally Abused: Not on file  . Physically Abused: Not on file  . Sexually Abused: Not on file   Review of Systems  Constitutional: Positive for fatigue. Negative for chills and diaphoresis.       Experiencing hair loss for several months  HENT: Negative for ear pain, postnasal drip and sinus pressure.   Eyes: Negative for photophobia, discharge, redness, itching and visual disturbance.  Respiratory: Negative for cough, shortness of breath and wheezing.   Cardiovascular: Negative for chest pain, palpitations and leg swelling.  Gastrointestinal: Negative for abdominal pain, constipation, diarrhea, nausea and vomiting.  Genitourinary: Negative for dysuria and flank pain.  Musculoskeletal: Negative for arthralgias, back pain, gait problem and neck pain.  Skin: Negative for color change.  Allergic/Immunologic: Negative for environmental allergies and food allergies.  Neurological: Negative for dizziness, tremors, weakness and headaches.  Hematological: Does not bruise/bleed easily.  Psychiatric/Behavioral: Negative for agitation, behavioral problems (depression) and hallucinations.   Vital Signs: BP 116/79   Pulse 88   Temp 98.2 F (36.8 C)   Resp 16   Ht 5' 3"  (1.6 m)   Wt 178 lb 6.4 oz (80.9 kg)   SpO2 100%   BMI 31.60 kg/m    Physical Exam Vitals reviewed.  Constitutional:      Appearance: Normal appearance.  HENT:     Mouth/Throat:     Mouth: Mucous membranes are moist.     Pharynx: Oropharynx is clear.  Cardiovascular:     Rate and Rhythm: Normal rate and regular rhythm.     Pulses: Normal pulses.     Heart sounds: Normal heart sounds.  Pulmonary:     Effort: Pulmonary effort is normal.     Breath sounds: Normal breath sounds.  Abdominal:     General: Abdomen is flat.     Palpations: Abdomen is soft.  Musculoskeletal:        General: Normal range of  motion.     Cervical back: Normal range of motion.  Skin:    General: Skin is warm.  Neurological:     General: No focal deficit present.     Mental Status: She is alert and oriented to person, place, and time. Mental status is at baseline.  Psychiatric:        Mood and Affect: Mood normal.        Behavior: Behavior normal.        Thought Content: Thought content normal.    Assessment/Plan: 1. B12 deficiency B12 level 211 on most recent lab review. Start B12 injections for supplementation. First injection given today, will repeat once a week for three weeks followed by once monthly  for three months. Will recheck levels at end of cycle. - Vitamin B12 - Folate - cyanocobalamin ((VITAMIN B-12)) injection 1,000 mcg  2. Hyperlipidemia, unspecified hyperlipidemia type Elevated total cholesterol levels found on most recent lab values. Discussed incorporating health eating habits as well as daily exercise to help in reduction of her overall cholesterol. Will recheck levels and provide treatment as appropriate. - Lipid Panel With LDL/HDL Ratio  3. Hereditary hemochromatosis ([place]) Iron and ferritin levels stable at this time, will repeat labs in 6 months. Continue to follow-up with hematology. - Iron and TIBC - Ferritin  4. Encounter for long-term (current) use of high-risk medication Will review labs at next follow-up appointment/CPE. - TSH + free T4 - COMPLETE METABOLIC PANEL WITH GFR - CBC w/Diff/Platelet - Vitamin D 1,25 dihydroxy  General Counseling: Jennifer Barron verbalizes understanding of the findings of todays visit and agrees with plan of treatment. I have discussed any further diagnostic evaluation that may be needed or ordered today. We also reviewed her medications today. she has been encouraged to call the office with any questions or concerns that should arise related to todays visit.    Orders Placed This Encounter  Procedures  . Lipid Panel With LDL/HDL Ratio  . Vitamin  B12  . Folate  . Iron and TIBC  . Ferritin  . TSH + free T4  . COMPLETE METABOLIC PANEL WITH GFR  . CBC w/Diff/Platelet  . Vitamin D 1,25 dihydroxy    Meds ordered this encounter  Medications  . cyanocobalamin ((VITAMIN B-12)) injection 1,000 mcg    Time spent: 40 Minutes Time spent includes review of chart, medications, test results and follow-up plan with the patient.  This patient was seen by Theodoro Grist AGNP-C in Collaboration with Dr Lavera Guise as a part of collaborative care agreement     Tanna Furry. Joclyn Alsobrook AGNP-C Internal medicine

## 2020-06-23 ENCOUNTER — Other Ambulatory Visit: Payer: Self-pay

## 2020-06-23 ENCOUNTER — Ambulatory Visit (INDEPENDENT_AMBULATORY_CARE_PROVIDER_SITE_OTHER): Payer: BC Managed Care – PPO

## 2020-06-23 DIAGNOSIS — E538 Deficiency of other specified B group vitamins: Secondary | ICD-10-CM | POA: Diagnosis not present

## 2020-06-23 MED ORDER — CYANOCOBALAMIN 1000 MCG/ML IJ SOLN
1000.0000 ug | Freq: Once | INTRAMUSCULAR | Status: AC
  Administered 2020-06-23: 1000 ug via INTRAMUSCULAR

## 2020-06-30 ENCOUNTER — Ambulatory Visit (INDEPENDENT_AMBULATORY_CARE_PROVIDER_SITE_OTHER): Payer: BC Managed Care – PPO

## 2020-06-30 ENCOUNTER — Other Ambulatory Visit: Payer: Self-pay

## 2020-06-30 DIAGNOSIS — E538 Deficiency of other specified B group vitamins: Secondary | ICD-10-CM

## 2020-06-30 MED ORDER — CYANOCOBALAMIN 1000 MCG/ML IJ SOLN
1000.0000 ug | Freq: Once | INTRAMUSCULAR | Status: AC
  Administered 2020-06-30: 1000 ug via INTRAMUSCULAR

## 2020-07-06 DIAGNOSIS — L658 Other specified nonscarring hair loss: Secondary | ICD-10-CM | POA: Diagnosis not present

## 2020-07-06 DIAGNOSIS — L700 Acne vulgaris: Secondary | ICD-10-CM | POA: Diagnosis not present

## 2020-07-07 ENCOUNTER — Other Ambulatory Visit: Payer: Self-pay

## 2020-07-07 ENCOUNTER — Ambulatory Visit (INDEPENDENT_AMBULATORY_CARE_PROVIDER_SITE_OTHER): Payer: BC Managed Care – PPO

## 2020-07-07 DIAGNOSIS — E538 Deficiency of other specified B group vitamins: Secondary | ICD-10-CM

## 2020-07-07 MED ORDER — CYANOCOBALAMIN 1000 MCG/ML IJ SOLN
1000.0000 ug | Freq: Once | INTRAMUSCULAR | Status: AC
  Administered 2020-07-07: 1000 ug via INTRAMUSCULAR

## 2020-07-20 DIAGNOSIS — Z1152 Encounter for screening for COVID-19: Secondary | ICD-10-CM | POA: Diagnosis not present

## 2020-07-20 DIAGNOSIS — Z03818 Encounter for observation for suspected exposure to other biological agents ruled out: Secondary | ICD-10-CM | POA: Diagnosis not present

## 2020-08-03 ENCOUNTER — Telehealth: Payer: BC Managed Care – PPO | Admitting: Emergency Medicine

## 2020-08-03 DIAGNOSIS — R300 Dysuria: Secondary | ICD-10-CM | POA: Diagnosis not present

## 2020-08-03 MED ORDER — NITROFURANTOIN MONOHYD MACRO 100 MG PO CAPS: 100.0000 mg | ORAL_CAPSULE | Freq: Two times a day (BID) | ORAL | 0 refills | Status: DC

## 2020-08-03 NOTE — Progress Notes (Signed)
We are sorry that you are not feeling well.  Here is how we plan to help!  Based on what you shared with me it looks like you most likely have a simple urinary tract infection.  A UTI (Urinary Tract Infection) is a bacterial infection of the bladder.  Most cases of urinary tract infections are simple to treat but a key part of your care is to encourage you to drink plenty of fluids and watch your symptoms carefully.  I have prescribed MacroBid 100 mg twice a day for 5 days.  Your symptoms should gradually improve. Call us if the burning in your urine worsens, you develop worsening fever, back pain or pelvic pain or if your symptoms do not resolve after completing the antibiotic.  Urinary tract infections can be prevented by drinking plenty of water to keep your body hydrated.  Also be sure when you wipe, wipe from front to back and don't hold it in!  If possible, empty your bladder every 4 hours.  Your e-visit answers were reviewed by a board certified advanced clinical practitioner to complete your personal care plan.  Depending on the condition, your plan could have included both over the counter or prescription medications.  If there is a problem please reply  once you have received a response from your provider.  Your safety is important to [place].  If you have drug allergies check your prescription carefully.    You can use MyChart to ask questions about today's visit, request a non-urgent call back, or ask for a work or school excuse for 24 hours related to this e-Visit. If it has been greater than 24 hours you will need to follow up with your provider, or enter a new e-Visit to address those concerns.   You will get an e-mail in the next two days asking about your experience.  I hope that your e-visit has been valuable and will speed your recovery. Thank you for using e-visits.   **Please do not respond to this message unless you have follow up questions.** Greater than 5 but less than 10  minutes spent researching, coordinating, and implementing care for this patient today

## 2020-08-05 ENCOUNTER — Other Ambulatory Visit: Payer: Self-pay

## 2020-08-05 ENCOUNTER — Ambulatory Visit (INDEPENDENT_AMBULATORY_CARE_PROVIDER_SITE_OTHER): Payer: BC Managed Care – PPO

## 2020-08-05 DIAGNOSIS — E538 Deficiency of other specified B group vitamins: Secondary | ICD-10-CM

## 2020-08-05 MED ORDER — CYANOCOBALAMIN 1000 MCG/ML IJ SOLN
1000.0000 ug | Freq: Once | INTRAMUSCULAR | Status: AC
  Administered 2020-08-05: 1000 ug via INTRAMUSCULAR

## 2020-09-04 ENCOUNTER — Other Ambulatory Visit: Payer: Self-pay

## 2020-09-04 ENCOUNTER — Ambulatory Visit (INDEPENDENT_AMBULATORY_CARE_PROVIDER_SITE_OTHER): Payer: BC Managed Care – PPO

## 2020-09-04 DIAGNOSIS — E538 Deficiency of other specified B group vitamins: Secondary | ICD-10-CM

## 2020-09-04 MED ORDER — CYANOCOBALAMIN 1000 MCG/ML IJ SOLN
1000.0000 ug | Freq: Once | INTRAMUSCULAR | Status: AC
  Administered 2020-09-04: 1000 ug via INTRAMUSCULAR

## 2020-10-02 ENCOUNTER — Ambulatory Visit (INDEPENDENT_AMBULATORY_CARE_PROVIDER_SITE_OTHER): Payer: BC Managed Care – PPO

## 2020-10-02 ENCOUNTER — Other Ambulatory Visit: Payer: Self-pay

## 2020-10-02 DIAGNOSIS — E538 Deficiency of other specified B group vitamins: Secondary | ICD-10-CM

## 2020-10-02 MED ORDER — CYANOCOBALAMIN 1000 MCG/ML IJ SOLN
1000.0000 ug | Freq: Once | INTRAMUSCULAR | Status: AC
  Administered 2020-10-02: 1000 ug via INTRAMUSCULAR

## 2020-11-02 ENCOUNTER — Ambulatory Visit (INDEPENDENT_AMBULATORY_CARE_PROVIDER_SITE_OTHER): Payer: BC Managed Care – PPO

## 2020-11-02 ENCOUNTER — Other Ambulatory Visit: Payer: Self-pay

## 2020-11-02 DIAGNOSIS — E538 Deficiency of other specified B group vitamins: Secondary | ICD-10-CM

## 2020-11-03 DIAGNOSIS — E538 Deficiency of other specified B group vitamins: Secondary | ICD-10-CM | POA: Diagnosis not present

## 2020-11-03 MED ORDER — CYANOCOBALAMIN 1000 MCG/ML IJ SOLN
1000.0000 ug | Freq: Once | INTRAMUSCULAR | Status: AC
  Administered 2020-11-03: 1000 ug via INTRAMUSCULAR

## 2020-12-03 ENCOUNTER — Other Ambulatory Visit: Payer: Self-pay

## 2020-12-03 ENCOUNTER — Ambulatory Visit (INDEPENDENT_AMBULATORY_CARE_PROVIDER_SITE_OTHER): Payer: BC Managed Care – PPO

## 2020-12-03 DIAGNOSIS — E538 Deficiency of other specified B group vitamins: Secondary | ICD-10-CM | POA: Diagnosis not present

## 2020-12-03 MED ORDER — CYANOCOBALAMIN 1000 MCG/ML IJ SOLN
1000.0000 ug | Freq: Once | INTRAMUSCULAR | Status: AC
  Administered 2020-12-03: 1000 ug via INTRAMUSCULAR

## 2020-12-17 ENCOUNTER — Encounter: Payer: Self-pay | Admitting: Physician Assistant

## 2020-12-17 ENCOUNTER — Other Ambulatory Visit: Payer: Self-pay

## 2020-12-17 ENCOUNTER — Ambulatory Visit (INDEPENDENT_AMBULATORY_CARE_PROVIDER_SITE_OTHER): Payer: BC Managed Care – PPO | Admitting: Physician Assistant

## 2020-12-17 DIAGNOSIS — R300 Dysuria: Secondary | ICD-10-CM

## 2020-12-17 DIAGNOSIS — Z124 Encounter for screening for malignant neoplasm of cervix: Secondary | ICD-10-CM

## 2020-12-17 DIAGNOSIS — R5383 Other fatigue: Secondary | ICD-10-CM

## 2020-12-17 DIAGNOSIS — E669 Obesity, unspecified: Secondary | ICD-10-CM

## 2020-12-17 DIAGNOSIS — Z0001 Encounter for general adult medical examination with abnormal findings: Secondary | ICD-10-CM | POA: Diagnosis not present

## 2020-12-17 DIAGNOSIS — R635 Abnormal weight gain: Secondary | ICD-10-CM

## 2020-12-17 DIAGNOSIS — E282 Polycystic ovarian syndrome: Secondary | ICD-10-CM

## 2020-12-17 DIAGNOSIS — E538 Deficiency of other specified B group vitamins: Secondary | ICD-10-CM

## 2020-12-17 DIAGNOSIS — F419 Anxiety disorder, unspecified: Secondary | ICD-10-CM

## 2020-12-17 NOTE — Progress Notes (Signed)
[facility] [place], [place] [address]  Internal MEDICINE  Office Visit Note  Patient Name: Jennifer Barron  [id number]  [id number]  Date of Service: 12/19/2020  Chief Complaint  Patient presents with  . Annual Exam    Weight gain, fatigue     HPI Pt is here for routine health maintenance examination -She is doing well, but is stressed. The stress is mainly due to family illness and helping to care for them.  -She is also concerned that she has been unable to lose weight. She does cardio 5d/wk and lifts weights 3d/wk. She reports she weighed 160pounds last feb. She then put on weight when stress from family illness started. She was not eating well then, but has improved this and been very focused on weight loss recently without results. She had previously been on phentermine and it worked well for her. She states she used to be approx 220 pounds at one point. She is concerned that her thyroid or iron may be off because she is also fatigued. She thinks she is getting enough sleep but does wake up in the middle of the night with anxiety. She goes to bed by 10 and is usually up at 4:30 to get in a workup and get ready for work. -She has been watching her BP and states at home it usually right at 120/80. Recheck in office was 120/88.  -She is on spironolactone for hair loss and sees derm for skin checks.  -She takes Prometrium every other month. She has a hx of PCOS.  -She has also been getting B12 injections so we will recheck her levels -she has a hx of hemachromatosis and was seeing hematology but has not seen them recently due to putting family needs above her own health. She will get back in with them and we will recheck her labs. -will be due for mammogram next year  Current Medication: Outpatient Encounter Medications as of 12/17/2020  Medication Sig  . cholecalciferol (VITAMIN D) 1000 units tablet Take 1 tablet (1,000 Units total) by mouth daily.  .  progesterone (PROMETRIUM) 200 MG capsule Prometrium 200 mg capsule  Take 1 capsule every day by oral route in the evening for 10 days.  Marland Kitchen spironolactone (ALDACTONE) 50 MG tablet   . [DISCONTINUED] montelukast (SINGULAIR) 10 MG tablet Take 1 tablet (10 mg total) by mouth at bedtime. (Patient not taking: Reported on 12/17/2020)  . [DISCONTINUED] nitrofurantoin, macrocrystal-monohydrate, (MACROBID) 100 MG capsule Take 1 capsule (100 mg total) by mouth 2 (two) times daily. (Patient not taking: Reported on 12/17/2020)  . [DISCONTINUED] phentermine 37.5 MG capsule Take 1 capsule (37.5 mg total) by mouth every morning. (Patient not taking: Reported on 12/17/2020)   No facility-administered encounter medications on file as of 12/17/2020.    Surgical History: Past Surgical History:  Procedure Laterality Date  . abdominal laproscopy    . ADENOIDECTOMY     twice  . TONSILLECTOMY Bilateral 1987  . WISDOM TOOTH EXTRACTION  2009    Medical History: Past Medical History:  Diagnosis Date  . Endometriosis   . Migraine     Family History: Family History  Problem Relation Age of Onset  . Ovarian cancer Paternal Grandmother   . Cancer Paternal Grandmother   . Prostate cancer Paternal Grandfather   . Cancer Paternal Grandfather   . Cancer Father       Review of Systems  Constitutional: Positive for fatigue and unexpected weight change. Negative for chills.  HENT: Negative for congestion, postnasal drip, rhinorrhea, sneezing and sore throat.   Eyes: Negative for redness.  Respiratory: Negative for cough, chest tightness and shortness of breath.   Cardiovascular: Negative for chest pain and palpitations.  Gastrointestinal: Negative for abdominal pain, constipation, diarrhea, nausea and vomiting.  Genitourinary: Negative for dysuria and frequency.  Musculoskeletal: Negative for arthralgias, back pain, joint swelling and neck pain.  Skin: Negative for rash.  Neurological: Negative.  Negative for  tremors and numbness.  Hematological: Negative for adenopathy. Does not bruise/bleed easily.  Psychiatric/Behavioral: Positive for sleep disturbance. Negative for behavioral problems (Depression) and suicidal ideas. The patient is nervous/anxious.      Vital Signs: BP (!) 118/94   Pulse 85   Temp (!) 97.4 F (36.3 C)   Resp 16   Ht 5' 3"  (1.6 m)   Wt 180 lb (81.6 kg)   SpO2 98%   BMI 31.89 kg/m    Physical Exam Constitutional:      General: She is not in acute distress.    Appearance: She is well-developed. She is obese. She is not diaphoretic.  HENT:     Head: Normocephalic and atraumatic.     Right Ear: External ear normal.     Left Ear: External ear normal.     Nose: Nose normal.     Mouth/Throat:     Pharynx: No oropharyngeal exudate.  Eyes:     General: No scleral icterus.       Right eye: No discharge.        Left eye: No discharge.     Conjunctiva/sclera: Conjunctivae normal.     Pupils: Pupils are equal, round, and reactive to light.  Neck:     Thyroid: No thyromegaly.     Vascular: No JVD.     Trachea: No tracheal deviation.  Cardiovascular:     Rate and Rhythm: Normal rate and regular rhythm.     Heart sounds: Normal heart sounds. No murmur heard. No friction rub. No gallop.   Pulmonary:     Effort: Pulmonary effort is normal. No respiratory distress.     Breath sounds: Normal breath sounds. No stridor. No wheezing or rales.  Chest:     Chest wall: No tenderness.  Breasts:     Right: Normal. No mass.     Left: Normal. No mass.    Abdominal:     General: Bowel sounds are normal. There is no distension.     Palpations: Abdomen is soft. There is no mass.     Tenderness: There is no abdominal tenderness. There is no guarding or rebound.  Musculoskeletal:        General: No tenderness or deformity. Normal range of motion.     Cervical back: Normal range of motion and neck supple.  Lymphadenopathy:     Cervical: No cervical adenopathy.  Skin:     General: Skin is warm and dry.     Coloration: Skin is not pale.     Findings: No erythema or rash.  Neurological:     Mental Status: She is alert.     Cranial Nerves: No cranial nerve deficit.     Motor: No abnormal muscle tone.     Coordination: Coordination normal.     Deep Tendon Reflexes: Reflexes are normal and symmetric.  Psychiatric:        Behavior: Behavior normal.        Thought Content: Thought content normal.        Judgment: Judgment  normal.      LABS: Recent Results (from the past 2160 hour(s))  UA/M w/rflx Culture, Routine     Status: Abnormal   Collection Time: 12/17/20  8:55 AM   Specimen: Urine   Urine  Result Value Ref Range   Specific Gravity, UA      <=1.005 (A) 1.005 - 1.030   pH, UA 7.0 5.0 - 7.5   Color, UA Yellow Yellow   Appearance Ur Clear Clear   Leukocytes,UA Negative Negative   Protein,UA Negative Negative/Trace   Glucose, UA Negative Negative   Ketones, UA Negative Negative   RBC, UA Negative Negative   Bilirubin, UA Negative Negative   Urobilinogen, Ur 0.2 0.2 - 1.0 mg/dL   Nitrite, UA Negative Negative   Microscopic Examination Comment     Comment: Microscopic follows if indicated.   Microscopic Examination See below:     Comment: Microscopic was indicated and was performed.   Urinalysis Reflex Comment     Comment: This specimen will not reflex to a Urine Culture.  Microscopic Examination     Status: None   Collection Time: 12/17/20  8:55 AM   Urine  Result Value Ref Range   WBC, UA None seen 0 - 5 /hpf   RBC None seen 0 - 2 /hpf   Epithelial Cells (non renal) None seen 0 - 10 /hpf   Casts None seen None seen /lpf   Bacteria, UA None seen None seen/Few       Assessment/Plan: 1. Encounter for general adult medical examination with abnormal findings Will order age appropriate lab work. - CBC with Differential/Platelet; Future - Lipid Panel With LDL/HDL Ratio; Future - TSH; Future - T4, free; Future - Comprehensive  metabolic panel - Fe+TIBC+Fer - VITAMIN D 25 Hydroxy (Vit-D Deficiency, Fractures); Future - B12 and Folate Panel - VITAMIN D 25 Hydroxy (Vit-D Deficiency, Fractures) - T4, free - TSH - Lipid Panel With LDL/HDL Ratio - CBC with Differential/Platelet  2. Weight gain Has been actively trying to lost weight without success. Pt is eating better and does cardio and weight lifting exercises most days of the week. Previously on phentermine. Will consider restarting pending lab work and ensuring diastolic BP is not elevated.  3. Obesity (BMI 30.0-34.9) Obesity Counseling: Had a lengthy discussion regarding patients BMI and weight issues. Patient was instructed on portion control as well as increased activity. Also discussed caloric restrictions with trying to maintain intake less than 2000 Kcal. Discussions were made in accordance with the 5As of weight management. Simple actions such as not eating late and if able to, taking a walk is suggested.  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 We will check her labs and she will follow-back up with them.  5. B12 deficiency Currently getting monthly injections. Recheck labs.  6. Anxiety Feels she is stressed and can manage on her own. She is not interested in starting any medications at this time.  7. PCOS (polycystic ovarian syndrome) Followed by OBGYN, on prometrium every other month.  8. Other fatigue - CBC with Differential/Platelet; Future - Lipid Panel With LDL/HDL Ratio; Future - TSH; Future - T4, free; Future - Comprehensive metabolic panel - Fe+TIBC+Fer - VITAMIN D 25 Hydroxy (Vit-D Deficiency, Fractures); Future - B12 and Folate Panel - VITAMIN D 25 Hydroxy (Vit-D Deficiency, Fractures) - T4, free - TSH - Lipid Panel With LDL/HDL Ratio - CBC with Differential/Platelet  9. Dysuria - UA/M w/rflx Culture, Routine  General Counseling: Jearldine verbalizes understanding of  the findings of todays visit and agrees with plan of treatment. I have discussed any further diagnostic evaluation that may be needed or ordered today. We also reviewed her medications today. she has been encouraged to call the office with any questions or concerns that should arise related to todays visit.    Counseling:    Orders Placed This Encounter  Procedures  . Microscopic Examination  . UA/M w/rflx Culture, Routine  . CBC with Differential/Platelet  . Lipid Panel With LDL/HDL Ratio  . TSH  . T4, free  . Comprehensive metabolic panel  . Fe+TIBC+Fer  . VITAMIN D 25 Hydroxy (Vit-D Deficiency, Fractures)  . B12 and Folate Panel    No orders of the defined types were placed in this encounter.  This patient was seen by Drema Dallas, PA-C in collaboration with Dr. Clayborn Bigness as a part of collaborative care agreement.   Total time spent: 30 Minutes  Time spent includes review of chart, medications, test results, and follow up plan with the patient.     Lavera Guise, MD  Internal Medicine

## 2020-12-18 LAB — UA/M W/RFLX CULTURE, ROUTINE
Bilirubin, UA: NEGATIVE
Glucose, UA: NEGATIVE
Ketones, UA: NEGATIVE
Leukocytes,UA: NEGATIVE
Nitrite, UA: NEGATIVE
Protein,UA: NEGATIVE
RBC, UA: NEGATIVE
Specific Gravity, UA: <=1.005 — AB (ref 1.005–1.030)
Urobilinogen, Ur: 0.2 mg/dL (ref 0.2–1.0)
pH, UA: 7 (ref 5.0–7.5)

## 2020-12-18 LAB — MICROSCOPIC EXAMINATION
Bacteria, UA: NONE SEEN
Casts: NONE SEEN /lpf
Epithelial Cells (non renal): NONE SEEN /hpf (ref 0–10)
RBC, Urine: NONE SEEN /hpf (ref 0–2)
WBC, UA: NONE SEEN /hpf (ref 0–5)

## 2020-12-31 ENCOUNTER — Ambulatory Visit: Payer: BC Managed Care – PPO

## 2021-01-15 ENCOUNTER — Ambulatory Visit: Payer: BC Managed Care – PPO | Admitting: Physician Assistant

## 2021-01-29 ENCOUNTER — Ambulatory Visit: Payer: BC Managed Care – PPO | Admitting: Physician Assistant

## 2021-02-11 ENCOUNTER — Telehealth: Payer: BC Managed Care – PPO | Admitting: Physician Assistant

## 2021-02-11 DIAGNOSIS — B001 Herpesviral vesicular dermatitis: Secondary | ICD-10-CM

## 2021-02-11 MED ORDER — VALACYCLOVIR HCL 1 G PO TABS: 2000.0000 mg | ORAL_TABLET | Freq: Two times a day (BID) | ORAL | 0 refills | Status: AC

## 2021-02-11 NOTE — Progress Notes (Signed)

## 2021-02-11 NOTE — Progress Notes (Signed)
I have spent 5 minutes in review of e-visit questionnaire, review and updating patient chart, medical decision making and response to patient.   Lazar Tierce Cody Lynzi Meulemans, PA-C    

## 2021-02-18 ENCOUNTER — Ambulatory Visit: Payer: BC Managed Care – PPO | Admitting: Physician Assistant

## 2021-03-02 ENCOUNTER — Ambulatory Visit: Payer: BC Managed Care – PPO | Admitting: Internal Medicine

## 2021-03-10 ENCOUNTER — Ambulatory Visit: Payer: BC Managed Care – PPO | Admitting: Nurse Practitioner

## 2021-03-11 ENCOUNTER — Telehealth: Payer: Self-pay | Admitting: Nurse Practitioner

## 2021-03-11 NOTE — Telephone Encounter (Signed)
lvm for patient to return my call to r/s 03/10/21 missed appt & to collect $25.00 fee-Toni

## 2021-03-30 ENCOUNTER — Ambulatory Visit: Payer: BC Managed Care – PPO | Admitting: Nurse Practitioner

## 2021-05-05 ENCOUNTER — Other Ambulatory Visit: Payer: Self-pay | Admitting: Physician Assistant

## 2021-05-05 DIAGNOSIS — R5383 Other fatigue: Secondary | ICD-10-CM | POA: Diagnosis not present

## 2021-05-05 DIAGNOSIS — Z0001 Encounter for general adult medical examination with abnormal findings: Secondary | ICD-10-CM | POA: Diagnosis not present

## 2021-05-06 ENCOUNTER — Encounter: Payer: Self-pay | Admitting: Internal Medicine

## 2021-05-06 LAB — CBC WITH DIFFERENTIAL/PLATELET
Basophils Absolute: 0 10*3/uL (ref 0.0–0.2)
Basos: 0 %
EOS (ABSOLUTE): 0.1 10*3/uL (ref 0.0–0.4)
Eos: 2 %
Hematocrit: 45.6 % (ref 34.0–46.6)
Hemoglobin: 15.7 g/dL (ref 11.1–15.9)
Immature Grans (Abs): 0 10*3/uL (ref 0.0–0.1)
Immature Granulocytes: 0 %
Lymphocytes Absolute: 1.9 10*3/uL (ref 0.7–3.1)
Lymphs: 29 %
MCH: 33.9 pg — ABNORMAL HIGH (ref 26.6–33.0)
MCHC: 34.4 g/dL (ref 31.5–35.7)
MCV: 99 fL — ABNORMAL HIGH (ref 79–97)
Monocytes Absolute: 0.7 10*3/uL (ref 0.1–0.9)
Monocytes: 10 %
Neutrophils Absolute: 3.7 10*3/uL (ref 1.4–7.0)
Neutrophils: 59 %
Platelets: 289 10*3/uL (ref 150–450)
RBC: 4.63 x10E6/uL (ref 3.77–5.28)
RDW: 11.5 % — ABNORMAL LOW (ref 11.7–15.4)
WBC: 6.4 10*3/uL (ref 3.4–10.8)

## 2021-05-06 LAB — LIPID PANEL WITH LDL/HDL RATIO
Cholesterol, Total: 288 mg/dL — ABNORMAL HIGH (ref 100–199)
HDL: 70 mg/dL (ref >39)
LDL Chol Calc (NIH): 197 mg/dL — ABNORMAL HIGH (ref 0–99)
LDL/HDL Ratio: 2.8 ratio (ref 0.0–3.2)
Triglycerides: 122 mg/dL (ref 0–149)
VLDL Cholesterol Cal: 21 mg/dL (ref 5–40)

## 2021-05-06 LAB — COMPREHENSIVE METABOLIC PANEL
ALT: 53 IU/L — ABNORMAL HIGH (ref 0–32)
AST: 46 IU/L — ABNORMAL HIGH (ref 0–40)
Albumin/Globulin Ratio: 2.2 (ref 1.2–2.2)
Albumin: 4.7 g/dL (ref 3.8–4.8)
Alkaline Phosphatase: 52 IU/L (ref 44–121)
BUN/Creatinine Ratio: 12 (ref 9–23)
BUN: 11 mg/dL (ref 6–20)
Bilirubin Total: 1.2 mg/dL (ref 0.0–1.2)
CO2: 23 mmol/L (ref 20–29)
Calcium: 9.6 mg/dL (ref 8.7–10.2)
Chloride: 99 mmol/L (ref 96–106)
Creatinine, Ser: 0.89 mg/dL (ref 0.57–1.00)
Globulin, Total: 2.1 g/dL (ref 1.5–4.5)
Glucose: 89 mg/dL (ref 65–99)
Potassium: 4.4 mmol/L (ref 3.5–5.2)
Sodium: 135 mmol/L (ref 134–144)
Total Protein: 6.8 g/dL (ref 6.0–8.5)
eGFR: 85 mL/min/{1.73_m2} (ref >59)

## 2021-05-06 LAB — IRON,TIBC AND FERRITIN PANEL
Ferritin: 306 ng/mL — ABNORMAL HIGH (ref 15–150)
Iron Saturation: 53 % (ref 15–55)
Iron: 201 ug/dL — ABNORMAL HIGH (ref 27–159)
Total Iron Binding Capacity: 378 ug/dL (ref 250–450)
UIBC: 177 ug/dL (ref 131–425)

## 2021-05-06 LAB — B12 AND FOLATE PANEL
Folate: 4.7 ng/mL (ref >3.0)
Vitamin B-12: 421 pg/mL (ref 232–1245)

## 2021-05-06 LAB — VITAMIN D 25 HYDROXY (VIT D DEFICIENCY, FRACTURES): Vit D, 25-Hydroxy: 56.1 ng/mL (ref 30.0–100.0)

## 2021-05-06 LAB — T4, FREE: Free T4: 1.57 ng/dL (ref 0.82–1.77)

## 2021-05-06 LAB — TSH: TSH: 2.7 u[IU]/mL (ref 0.450–4.500)

## 2021-05-07 ENCOUNTER — Telehealth: Payer: Self-pay

## 2021-05-07 NOTE — Telephone Encounter (Signed)
Spoke to pt she already has an appt scheduled to review labs

## 2021-05-07 NOTE — Telephone Encounter (Signed)
-----   Message from Fay Records, [place] sent at 05/06/2021  9:36 AM EDT ----- Regarding: Pt req a nurse call Patient called and recently had her lab work completed and is requesting a nurse call her.

## 2021-06-07 ENCOUNTER — Other Ambulatory Visit: Payer: Self-pay

## 2021-06-07 ENCOUNTER — Ambulatory Visit: Payer: BC Managed Care – PPO | Admitting: Internal Medicine

## 2021-06-07 ENCOUNTER — Encounter: Payer: Self-pay | Admitting: Internal Medicine

## 2021-06-07 DIAGNOSIS — E538 Deficiency of other specified B group vitamins: Secondary | ICD-10-CM

## 2021-06-07 DIAGNOSIS — Z6830 Body mass index (BMI) 30.0-30.9, adult: Secondary | ICD-10-CM

## 2021-06-07 DIAGNOSIS — R7309 Other abnormal glucose: Secondary | ICD-10-CM | POA: Diagnosis not present

## 2021-06-07 MED ORDER — RYBELSUS 3 MG PO TABS: ORAL_TABLET | ORAL | 3 refills | Status: DC

## 2021-06-07 MED ORDER — CYANOCOBALAMIN 1000 MCG/ML IJ SOLN
1000.0000 ug | Freq: Once | INTRAMUSCULAR | Status: AC
  Administered 2021-06-07: 1000 ug via INTRAMUSCULAR

## 2021-06-07 NOTE — Progress Notes (Signed)
[facility] [place],  [address]  Internal MEDICINE  Office Visit Note  Patient Name: Jennifer Barron  [id number]  [id number]  Date of Service: 06/10/2021  Chief Complaint  Patient presents with   Follow-up    Labs    Migraine   Quality Metric Gaps    pap   Fatigue    HPI  Pt is here for routine follow up Has h/o hemochromatosis and was getting phelbotomy on a regular basis however Jennifer followed by Hematology over last 2 years, will like to go back  Has increased fatigue, low B12 levels  Abnormal baseline blood sugar, was able to lose wt however has gained more wt during COVID pandemic Denies any other problem   Current Medication: Outpatient Encounter Medications as of 06/07/2021  Medication Sig   [DISCONTINUED] Semaglutide (RYBELSUS) 3 MG TABS Take one tab po qd in am on empty stomach   cholecalciferol (VITAMIN D) 1000 units tablet Take 1 tablet (1,000 Units total) by mouth daily.   ID NOW COVID-19 KIT TEST AS DIRECTED TODAY   progesterone (PROMETRIUM) 200 MG capsule Prometrium 200 mg capsule  Take 1 capsule every day by oral route in the evening for 10 days.   spironolactone (ALDACTONE) 50 MG tablet    [EXPIRED] cyanocobalamin ((VITAMIN B-12)) injection 1,000 mcg    No facility-administered encounter medications on Barron as of 06/07/2021.    Surgical History: Past Surgical History:  Procedure Laterality Date   abdominal laproscopy     ADENOIDECTOMY     twice   TONSILLECTOMY Bilateral 1987   WISDOM TOOTH EXTRACTION  2009    Medical History: Past Medical History:  Diagnosis Date   Endometriosis    Migraine     Family History: Family History  Problem Relation Age of Onset   Ovarian cancer Paternal Grandmother    Cancer Paternal Grandmother    Prostate cancer Paternal Grandfather    Cancer Paternal Grandfather    Cancer Father     Social History   Socioeconomic History   Marital status: Married    Spouse name: Jennifer Barron   Number of children: Jennifer on Barron   Years of education: Jennifer on Barron   Highest education level: Jennifer on Barron  Occupational History   Jennifer on Barron  Tobacco Use   Smoking status: Never   Smokeless tobacco: Never  Vaping Use   Vaping Use: Never used  Substance and Sexual Activity   Alcohol use: Yes    Comment: social   Drug use: No   Sexual activity: Yes  Other Topics Concern   Jennifer on Barron  Social History Narrative   Jennifer on Barron   Social Determinants of Health   Financial Resource Strain: Jennifer on Barron  Food Insecurity: Jennifer on Barron  Transportation Needs: Jennifer on Barron  Physical Activity: Jennifer on Barron  Stress: Jennifer on Barron  Social Connections: Jennifer on Barron  Intimate Partner Violence: Jennifer on Barron      Review of Systems  Constitutional:  Negative for chills, fatigue and unexpected weight change.  HENT:  Negative for congestion, postnasal drip, rhinorrhea, sneezing and sore throat.   Eyes:  Negative for redness.  Respiratory:  Negative for cough, chest tightness and shortness of breath.   Cardiovascular:  Negative for chest pain and palpitations.  Gastrointestinal:  Negative for abdominal pain, constipation, diarrhea, nausea and vomiting.  Genitourinary:  Negative for dysuria and frequency.  Musculoskeletal:  Negative for arthralgias, back  pain, joint swelling and neck pain.  Skin:  Negative for rash.  Neurological: Negative.  Negative for tremors and numbness.  Hematological:  Negative for adenopathy. Does Jennifer bruise/bleed easily.  Psychiatric/Behavioral:  Negative for behavioral problems (Depression), sleep disturbance and suicidal ideas. The patient is Jennifer nervous/anxious.    Vital Signs: BP 128/84   Pulse 78   Temp 97.8 F (36.6 C)   Resp 16   Ht 5' 3" (1.6 m)   Wt 173 lb (78.5 kg)   SpO2 98%   BMI 30.65 kg/m    Physical Exam Constitutional:      Appearance: Normal appearance.  HENT:     Head: Normocephalic and atraumatic.     Nose: Nose normal.      Mouth/Throat:     Mouth: Mucous membranes are moist.     Pharynx: No posterior oropharyngeal erythema.  Eyes:     Extraocular Movements: Extraocular movements intact.     Pupils: Pupils are equal, round, and reactive to light.  Cardiovascular:     Pulses: Normal pulses.     Heart sounds: Normal heart sounds.  Pulmonary:     Effort: Pulmonary effort is normal.     Breath sounds: Normal breath sounds.  Neurological:     General: No focal deficit present.     Mental Status: She is alert.  Psychiatric:        Mood and Affect: Mood normal.        Behavior: Behavior normal.       Assessment/Plan: 1. Hereditary hemochromatosis ([place]) Ferritin level is mildly elevated, will refer  - Ambulatory referral to Hematology / Oncology  2. Abnormal glucose Monitor diet for concentrated sweets, might get benefit from Rybelsus    3. B12 deficiency - cyanocobalamin ((VITAMIN B-12)) injection 1,000 mcg  4. BMI 30.0-30.9,adult Obesity Counseling: Risk Assessment: An assessment of behavioral risk factors was made today and includes lack of exercise sedentary lifestyle, lack of portion control and poor dietary habits.  Risk Modification Advice: She was counseled on portion control guidelines. Restricting daily caloric intake to 1200. The detrimental long term effects of obesity on her health and ongoing poor compliance was also discussed with the patient.     General Counseling: Jennifer Barron verbalizes understanding of the findings of todays visit and agrees with plan of treatment. I have discussed any further diagnostic evaluation that may be needed or ordered today. We also reviewed her medications today. she has been encouraged to call the office with any questions or concerns that should arise related to todays visit.    Orders Placed This Encounter  Procedures   Ambulatory referral to Hematology / Oncology    Meds ordered this encounter  Medications   DISCONTD: Semaglutide (RYBELSUS) 3 MG  TABS    Sig: Take one tab po qd in am on empty stomach    Dispense:  30 tablet    Refill:  3   cyanocobalamin ((VITAMIN B-12)) injection 1,000 mcg    Total time spent:30 Minutes Time spent includes review of chart, medications, test results, and follow up plan with the patient.   [place] Controlled Substance Database was reviewed by me.   Dr Lavera Guise Internal medicine

## 2021-06-08 ENCOUNTER — Other Ambulatory Visit: Payer: Self-pay

## 2021-06-08 MED ORDER — RYBELSUS 3 MG PO TABS: ORAL_TABLET | ORAL | 3 refills | Status: DC

## 2021-06-08 NOTE — Telephone Encounter (Signed)
Please resend

## 2021-06-14 ENCOUNTER — Encounter: Payer: Self-pay | Admitting: Oncology

## 2021-06-15 ENCOUNTER — Telehealth: Payer: Self-pay

## 2021-06-15 NOTE — Telephone Encounter (Signed)
PA for Rybelsus 3 mg sent thru coverMy Meds at 3:33pm.    PA for RYBELSUS came back Denied.  Pt informed and will check with [facility] on what she wants to do, if she will change medication or do Appeal

## 2021-06-17 ENCOUNTER — Other Ambulatory Visit: Payer: Self-pay

## 2021-06-17 ENCOUNTER — Encounter: Payer: Self-pay | Admitting: Oncology

## 2021-06-21 ENCOUNTER — Inpatient Hospital Stay: Payer: BC Managed Care – PPO

## 2021-06-21 DIAGNOSIS — E669 Obesity, unspecified: Secondary | ICD-10-CM | POA: Insufficient documentation

## 2021-06-21 DIAGNOSIS — R7989 Other specified abnormal findings of blood chemistry: Secondary | ICD-10-CM | POA: Diagnosis not present

## 2021-06-21 DIAGNOSIS — K760 Fatty (change of) liver, not elsewhere classified: Secondary | ICD-10-CM | POA: Diagnosis not present

## 2021-06-21 LAB — COMPREHENSIVE METABOLIC PANEL
ALT: 50 U/L — ABNORMAL HIGH (ref 0–44)
AST: 51 U/L — ABNORMAL HIGH (ref 15–41)
Albumin: 4.6 g/dL (ref 3.5–5.0)
Alkaline Phosphatase: 43 U/L (ref 38–126)
Anion gap: 7 (ref 5–15)
BUN: 9 mg/dL (ref 6–20)
CO2: 30 mmol/L (ref 22–32)
Calcium: 9.3 mg/dL (ref 8.9–10.3)
Chloride: 101 mmol/L (ref 98–111)
Creatinine, Ser: 0.79 mg/dL (ref 0.44–1.00)
GFR, Estimated: >60 mL/min (ref >60)
Glucose, Bld: 90 mg/dL (ref 70–99)
Potassium: 4.2 mmol/L (ref 3.5–5.1)
Sodium: 138 mmol/L (ref 135–145)
Total Bilirubin: 1.1 mg/dL (ref 0.3–1.2)
Total Protein: 7.4 g/dL (ref 6.5–8.1)

## 2021-06-21 LAB — CBC WITH DIFFERENTIAL/PLATELET
Abs Immature Granulocytes: 0.02 10*3/uL (ref 0.00–0.07)
Basophils Absolute: 0 10*3/uL (ref 0.0–0.1)
Basophils Relative: 0 %
Eosinophils Absolute: 0.2 10*3/uL (ref 0.0–0.5)
Eosinophils Relative: 3 %
HCT: 43.8 % (ref 36.0–46.0)
Hemoglobin: 15.6 g/dL — ABNORMAL HIGH (ref 12.0–15.0)
Immature Granulocytes: 0 %
Lymphocytes Relative: 33 %
Lymphs Abs: 1.9 10*3/uL (ref 0.7–4.0)
MCH: 35 pg — ABNORMAL HIGH (ref 26.0–34.0)
MCHC: 35.6 g/dL (ref 30.0–36.0)
MCV: 98.2 fL (ref 80.0–100.0)
Monocytes Absolute: 0.6 10*3/uL (ref 0.1–1.0)
Monocytes Relative: 10 %
Neutro Abs: 3 10*3/uL (ref 1.7–7.7)
Neutrophils Relative %: 54 %
Platelets: 291 10*3/uL (ref 150–400)
RBC: 4.46 MIL/uL (ref 3.87–5.11)
RDW: 10.8 % — ABNORMAL LOW (ref 11.5–15.5)
WBC: 5.6 10*3/uL (ref 4.0–10.5)
nRBC: 0 % (ref 0.0–0.2)

## 2021-06-21 LAB — IRON AND TIBC
Iron: 166 ug/dL (ref 28–170)
Saturation Ratios: 37 % — ABNORMAL HIGH (ref 10.4–31.8)
TIBC: 447 ug/dL (ref 250–450)
UIBC: 281 ug/dL

## 2021-06-21 LAB — FERRITIN: Ferritin: 136 ng/mL (ref 11–307)

## 2021-06-21 NOTE — Telephone Encounter (Signed)
Can u appeal

## 2021-06-22 ENCOUNTER — Inpatient Hospital Stay: Payer: BC Managed Care – PPO | Attending: Oncology | Admitting: Oncology

## 2021-06-22 ENCOUNTER — Other Ambulatory Visit: Payer: Self-pay

## 2021-06-22 ENCOUNTER — Encounter: Payer: Self-pay | Admitting: Oncology

## 2021-06-22 ENCOUNTER — Inpatient Hospital Stay: Payer: BC Managed Care – PPO

## 2021-06-22 DIAGNOSIS — R7989 Other specified abnormal findings of blood chemistry: Secondary | ICD-10-CM | POA: Diagnosis not present

## 2021-06-22 DIAGNOSIS — R7401 Elevation of levels of liver transaminase levels: Secondary | ICD-10-CM

## 2021-06-22 DIAGNOSIS — K760 Fatty (change of) liver, not elsewhere classified: Secondary | ICD-10-CM | POA: Diagnosis not present

## 2021-06-22 DIAGNOSIS — E669 Obesity, unspecified: Secondary | ICD-10-CM | POA: Diagnosis not present

## 2021-06-22 NOTE — Progress Notes (Signed)
Pt in for follow up, denies any difficulties or concerns today.

## 2021-06-22 NOTE — Progress Notes (Signed)
Therapeutic phlebotomy performed removed 500 ml blood. Patient tolerated well. Drank a beverage. Feeling well . Discharged to home.

## 2021-06-22 NOTE — Progress Notes (Signed)
Hematology/Oncology Follow up note [facility] [phone] [phone]   Patient Care Team: Lavera Guise, MD as PCP - General (Internal Medicine)  REFERRING PROVIDER: Lavera Guise, MD REASON FOR VISIT Follow up for treatment of iron overload and hemochromatosis.    HISTORY OF PRESENTING ILLNESS:  Jennifer Barron is a  40 y.o.  female with PMH listed below who was referred to me for evaluation of elevated ferritin. Patient follows up with GYN for IUD placement, and was fine to have abnormal liver function test, mainly elevated transaminitis.  Patient went to primary care physician Dr. Humphrey Rolls And had additional testing.  She was found to have elevated ferritin level at 547, iron saturation 33, ultrasound of abdomen was done on January 12, 2018, which showed upper normal limits spleen size and increased echogenicity of liver questionable fatty liver. Patient was referred to [facility] for further evaluation of elevated ferritin. Currently patient takes spironolactone for acne and this was started by dermatologist a week ago.  She also takes phentermine for obesity, recently started. Patient reports feeling tired fatigue.  Denies any joint pain shortness of breath, lower extremity swelling.  She denies any family history of hemochromatosis.  INTERVAL HISTORY Jennifer Barron is a 40 y.o. female who has above history reviewed by me today presents for follow-up visit for management of iron overload and hemochromatosis. Patient was seen about 2 years ago.  Lost follow-up She has feels more tired and fatigued recently.   Review of Systems  Constitutional:  Positive for malaise/fatigue. Negative for chills, fever and weight loss.  HENT:  Negative for sore throat.   Eyes:  Negative for redness.  Respiratory:  Negative for cough, shortness of breath and wheezing.   Cardiovascular:  Negative for chest pain, palpitations and leg swelling.   Gastrointestinal:  Negative for abdominal pain, blood in stool, nausea and vomiting.  Genitourinary:  Negative for dysuria.  Musculoskeletal:  Negative for myalgias.  Skin:  Negative for rash.  Neurological:  Negative for dizziness, tingling and tremors.  Endo/Heme/Allergies:  Does not bruise/bleed easily.  Psychiatric/Behavioral:  Negative for hallucinations.    MEDICAL HISTORY:  Past Medical History:  Diagnosis Date   Endometriosis    Migraine     SURGICAL HISTORY: Past Surgical History:  Procedure Laterality Date   abdominal laproscopy     ADENOIDECTOMY     twice   TONSILLECTOMY Bilateral 1987   WISDOM TOOTH EXTRACTION  2009    SOCIAL HISTORY: Social History   Socioeconomic History   Marital status: Married    Spouse name: Not on file   Number of children: Not on file   Years of education: Not on file   Highest education level: Not on file  Occupational History   Not on file  Tobacco Use   Smoking status: Never   Smokeless tobacco: Never  Vaping Use   Vaping Use: Never used  Substance and Sexual Activity   Alcohol use: Yes    Comment: social   Drug use: No   Sexual activity: Yes  Other Topics Concern   Not on file  Social History Narrative   Not on file   Social Determinants of Health   Financial Resource Strain: Not on file  Food Insecurity: Not on file  Transportation Needs: Not on file  Physical Activity: Not on file  Stress: Not on file  Social Connections: Not on file  Intimate Partner Violence: Not on file    FAMILY HISTORY: Family  History  Problem Relation Age of Onset   Ovarian cancer Paternal Grandmother    Cancer Paternal Grandmother    Prostate cancer Paternal Grandfather    Cancer Paternal Grandfather    Cancer Father     ALLERGIES:  has No Known Allergies.  MEDICATIONS:  Current Outpatient Medications  Medication Sig Dispense Refill   cholecalciferol (VITAMIN D) 1000 units tablet Take 1 tablet (1,000 Units total) by mouth  daily. 90 tablet 0   spironolactone (ALDACTONE) 50 MG tablet   4   Semaglutide (RYBELSUS) 3 MG TABS Take one tab po qd in am on empty stomach (Patient not taking: Reported on 06/22/2021) 30 tablet 3   No current facility-administered medications for this visit.     PHYSICAL EXAMINATION: ECOG PERFORMANCE STATUS: 1 - Symptomatic but completely ambulatory Vitals:   06/22/21 1454  BP: (!) 129/92  Pulse: 71  Resp: 14  Temp: 98.9 F (37.2 C)  SpO2: 100%   Filed Weights   06/22/21 1454  Weight: 189 lb (85.7 kg)    Physical Exam Constitutional:      General: She is not in acute distress. HENT:     Head: Normocephalic and atraumatic.  Eyes:     General: No scleral icterus.    Conjunctiva/sclera: Conjunctivae normal.     Pupils: Pupils are equal, round, and reactive to light.  Cardiovascular:     Rate and Rhythm: Normal rate and regular rhythm.     Heart sounds: Normal heart sounds. No murmur heard.   No friction rub. No gallop.  Pulmonary:     Effort: Pulmonary effort is normal. No respiratory distress.     Breath sounds: Normal breath sounds. No wheezing or rales.  Chest:     Chest wall: No tenderness.  Abdominal:     General: Bowel sounds are normal. There is no distension.     Palpations: Abdomen is soft. There is no mass.     Tenderness: There is no abdominal tenderness.  Musculoskeletal:        General: No deformity. Normal range of motion.     Cervical back: Normal range of motion and neck supple.  Lymphadenopathy:     Cervical: No cervical adenopathy.  Skin:    General: Skin is warm and dry.     Findings: No erythema or rash.  Neurological:     Mental Status: She is alert and oriented to person, place, and time.     Cranial Nerves: No cranial nerve deficit.     Coordination: Coordination normal.     Deep Tendon Reflexes: Reflexes normal.  Psychiatric:        Behavior: Behavior normal.        Thought Content: Thought content normal.     LABORATORY DATA:   I have reviewed the data as listed Lab Results  Component Value Date   WBC 5.6 06/21/2021   HGB 15.6 (H) 06/21/2021   HCT 43.8 06/21/2021   MCV 98.2 06/21/2021   PLT 291 06/21/2021   Recent Labs    05/05/21 0740 06/21/21 0816  NA 135 138  K 4.4 4.2  CL 99 101  CO2 23 30  GLUCOSE 89 90  BUN 11 9  CREATININE 0.89 0.79  CALCIUM 9.6 9.3  GFRNONAA  --  >60  PROT 6.8 7.4  ALBUMIN 4.7 4.6  AST 46* 51*  ALT 53* 50*  ALKPHOS 52 43  BILITOT 1.2 1.1     Hepatitis B surface antigen negative, hepatitis C RNA nondetectable.  01/13/2108 US abdomen showed upper normal limits spleen size and increased echogenicity of liver questionable fatty liver.  ASSESSMENT & PLAN:  1. Hereditary hemochromatosis ([place])   2. 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 Iron panel shows increased iron saturation 37, ferritin 136. Transaminitis with mild elevation of AST and ALT. Proceed with phlebotomy weekly x3.  #Fatty liver disease.  Transaminitis I will repeat ultrasound of right upper quadrant.  All questions were answered. The patient knows to call the clinic with any problems questions or concerns.  Orders Placed This Encounter  Procedures   CBC with Differential/Platelet    Standing Status:   Future    Standing Expiration Date:   06/22/2022   Ferritin    Standing Status:   Future    Standing Expiration Date:   06/22/2022   Iron and TIBC    Standing Status:   Future    Standing Expiration Date:   06/22/2022   Hepatic function panel    Standing Status:   Future    Standing Expiration Date:   06/22/2022     Earlie Server, MD, PhD Hematology Oncology [place] [facility]  06/22/2021

## 2021-06-22 NOTE — Patient Instructions (Signed)
Therapeutic Phlebotomy, Care After This sheet gives you information about how to care for yourself after your procedure. Your health care provider may also give you more specific instructions. If you have problems or questions, contact your health careprovider. What can I expect after the procedure? After the procedure, it is common to have: Light-headedness or dizziness. You may feel faint. Nausea. Tiredness (fatigue). Follow these instructions at home: Eating and drinking Be sure to eat well-balanced meals for the next 24 hours. Drink enough fluid to keep your urine pale yellow. Avoid drinking alcohol on the day that you had the procedure. Activity  Return to your normal activities as told by your health care provider. Most people can go back to their normal activities right away. Avoid activities that take a lot of effort for about 5 hours after the procedure. Athletes should avoid strenuous exercise for at least 12 hours. Avoid heavy lifting or pulling for about 5 hours after the procedure. Do not lift anything that is heavier than 10 lb (4.5 kg). Change positions slowly for the remainder of the day. This will help to prevent light-headedness or fainting. If you feel light-headed, lie down until the feeling goes away.  Needle insertion site care  Keep your bandage (dressing) dry. You can remove the bandage after about 5 hours or as told by your health care provider. If you have bleeding from the needle insertion site, raise (elevate) your arm and press firmly on the site until the bleeding stops. If you have bruising at the site, apply ice to the area: Remove the dressing. Put ice in a plastic bag. Place a towel between your skin and the bag. Leave the ice on for 20 minutes, 2-3 times a day for the first 24 hours. If the swelling does not go away after 24 hours, apply a warm, moist cloth (warm compress) to the area for 20 minutes, 2-3 times a day.  General instructions Do not use  any products that contain nicotine or tobacco, such as cigarettes and e-cigarettes, for at least 30 minutes after the procedure. Keep all follow-up visits as told by your health care provider. This is important. You may need to continue having regular therapeutic phlebotomy treatments as directed. Contact a health care provider if you: Have redness, swelling, or pain at the needle insertion site. Have fluid or blood coming from the needle insertion site. Have pus or a bad smell coming from the needle insertion site. Notice that the needle insertion site feels warm to the touch. Feel light-headed, dizzy, or nauseous, and the feeling does not go away. Have new bruising at the needle insertion site. Feel weaker than normal. Have a fever or chills. Get help right away if: You faint. You have chest pain. You have trouble breathing. You have severe nausea or vomiting. Summary After the procedure, it is common to have some light-headedness, dizziness, nausea, or tiredness (fatigue). Be sure to eat well-balanced meals for the next 24 hours. Drink enough fluid to keep your urine pale yellow. Return to your normal activities as told by your health care provider. Keep all follow-up visits as told by your health care provider. You may need to continue having regular therapeutic phlebotomy treatments as directed. This information is not intended to replace advice given to you by your health care provider. Make sure you discuss any questions you have with your healthcare provider. Document Revised: 10/27/2017 Document Reviewed: 10/26/2017 Elsevier Patient Education  [place].

## 2021-06-25 ENCOUNTER — Telehealth: Payer: Self-pay

## 2021-06-25 NOTE — Telephone Encounter (Signed)
Spoke to pt and informed her that her insurance will not cover the Rybelsus due to her not being a diabetic, advised pt that we can do trulicity per DFK and she was ok with it.  Had pt change her appt sooner that 07/05/21 to discuss new medication

## 2021-06-29 ENCOUNTER — Ambulatory Visit (INDEPENDENT_AMBULATORY_CARE_PROVIDER_SITE_OTHER): Payer: BC Managed Care – PPO | Admitting: Internal Medicine

## 2021-06-29 ENCOUNTER — Other Ambulatory Visit: Payer: Self-pay

## 2021-06-29 ENCOUNTER — Encounter: Payer: Self-pay | Admitting: Internal Medicine

## 2021-06-29 VITALS — BP 132/88 | HR 98 | Temp 98.0°F | Resp 16 | Ht 63.0 in | Wt 185.6 lb

## 2021-06-29 DIAGNOSIS — I10 Essential (primary) hypertension: Secondary | ICD-10-CM

## 2021-06-29 DIAGNOSIS — E538 Deficiency of other specified B group vitamins: Secondary | ICD-10-CM | POA: Diagnosis not present

## 2021-06-29 DIAGNOSIS — Z6832 Body mass index (BMI) 32.0-32.9, adult: Secondary | ICD-10-CM | POA: Diagnosis not present

## 2021-06-29 DIAGNOSIS — G43009 Migraine without aura, not intractable, without status migrainosus: Secondary | ICD-10-CM | POA: Diagnosis not present

## 2021-06-29 MED ORDER — CYANOCOBALAMIN 1000 MCG/ML IJ SOLN
1000.0000 ug | Freq: Once | INTRAMUSCULAR | Status: AC
  Administered 2021-06-29: 1000 ug via INTRAMUSCULAR

## 2021-06-29 MED ORDER — PHENTERMINE HCL 37.5 MG PO CAPS: 37.5000 mg | ORAL_CAPSULE | ORAL | 0 refills | Status: DC

## 2021-06-29 MED ORDER — CARVEDILOL 3.125 MG PO TABS: 3.1250 mg | ORAL_TABLET | Freq: Two times a day (BID) | ORAL | 1 refills | Status: DC

## 2021-06-29 MED ORDER — TOPIRAMATE 25 MG PO TABS
ORAL_TABLET | ORAL | 1 refills | Status: DC
Start: 2021-06-29 — End: 2021-07-26

## 2021-06-29 NOTE — Progress Notes (Signed)
[facility] [place], [place] [address]  Internal MEDICINE  Office Visit Note  Patient Name: Jennifer Barron  [id number]  [id number]  Date of Service: 06/29/2021  Chief Complaint  Patient presents with   Weight Loss    4 week fup, change to new medication    HPI Pt is here for routine follow up - C/O headaches, not well controlled  - Rybelsus was not covered by insurance - BP has been elevated  - Hemochromatosis is followed by Hematology   Current Medication: Outpatient Encounter Medications as of 06/29/2021  Medication Sig   carvedilol (COREG) 3.125 MG tablet Take 1 tablet (3.125 mg total) by mouth 2 (two) times daily with a meal.   cholecalciferol (VITAMIN D) 1000 units tablet Take 1 tablet (1,000 Units total) by mouth daily.   phentermine 37.5 MG capsule Take 1 capsule (37.5 mg total) by mouth every morning.   spironolactone (ALDACTONE) 50 MG tablet    topiramate (TOPAMAX) 25 MG tablet Take one tab po qd with supper for headaches   [DISCONTINUED] Semaglutide (RYBELSUS) 3 MG TABS Take one tab po qd in am on empty stomach (Patient not taking: No sig reported)   No facility-administered encounter medications on file as of 06/29/2021.    Surgical History: Past Surgical History:  Procedure Laterality Date   abdominal laproscopy     ADENOIDECTOMY     twice   TONSILLECTOMY Bilateral 1987   WISDOM TOOTH EXTRACTION  2009    Medical History: Past Medical History:  Diagnosis Date   Endometriosis    Migraine     Family History: Family History  Problem Relation Age of Onset   Ovarian cancer Paternal Grandmother    Cancer Paternal Grandmother    Prostate cancer Paternal Grandfather    Cancer Paternal Grandfather    Cancer Father     Social History   Socioeconomic History   Marital status: Married    Spouse name: Not on file   Number of children: Not on file   Years of education: Not on file   Highest education level: Not on file   Occupational History   Not on file  Tobacco Use   Smoking status: Never   Smokeless tobacco: Never  Vaping Use   Vaping Use: Never used  Substance and Sexual Activity   Alcohol use: Yes    Comment: social   Drug use: No   Sexual activity: Yes  Other Topics Concern   Not on file  Social History Narrative   Not on file   Social Determinants of Health   Financial Resource Strain: Not on file  Food Insecurity: Not on file  Transportation Needs: Not on file  Physical Activity: Not on file  Stress: Not on file  Social Connections: Not on file  Intimate Partner Violence: Not on file      Review of Systems  Constitutional:  Negative for chills, fatigue, fever and unexpected weight change.  HENT:  Negative for congestion, mouth sores, postnasal drip, rhinorrhea, sneezing and sore throat.   Eyes:  Negative for redness.  Respiratory:  Negative for cough, chest tightness and shortness of breath.   Cardiovascular:  Negative for chest pain and palpitations.  Gastrointestinal:  Negative for abdominal pain, constipation, diarrhea, nausea and vomiting.  Genitourinary:  Negative for dysuria, flank pain and frequency.  Musculoskeletal:  Negative for arthralgias, back pain, joint swelling and neck pain.  Skin:  Negative for rash.  Neurological: Negative.  Negative for tremors  and numbness.  Hematological:  Negative for adenopathy. Does not bruise/bleed easily.  Psychiatric/Behavioral: Negative.  Negative for behavioral problems (Depression), sleep disturbance and suicidal ideas. The patient is not nervous/anxious.    Vital Signs: BP 132/88   Pulse 98   Temp 98 F (36.7 C)   Resp 16   Ht 5' 3"  (1.6 m)   Wt 185 lb 9.6 oz (84.2 kg)   SpO2 98%   BMI 32.88 kg/m    Physical Exam Constitutional:      Appearance: Normal appearance.  HENT:     Head: Normocephalic and atraumatic.     Nose: Nose normal.     Mouth/Throat:     Mouth: Mucous membranes are moist.     Pharynx: No  posterior oropharyngeal erythema.  Eyes:     Extraocular Movements: Extraocular movements intact.     Pupils: Pupils are equal, round, and reactive to light.  Cardiovascular:     Pulses: Normal pulses.     Heart sounds: Normal heart sounds.  Pulmonary:     Effort: Pulmonary effort is normal.     Breath sounds: Normal breath sounds.  Neurological:     General: No focal deficit present.     Mental Status: She is alert.  Psychiatric:        Mood and Affect: Mood normal.        Behavior: Behavior normal.       Assessment/Plan: 1. Migraine without aura and without status migrainosus, not intractable We will start Topamax 25 mg once a day to help with prevention of migraine headaches - topiramate (TOPAMAX) 25 MG tablet; Take one tab po qd with supper for headaches  Dispense: 90 tablet; Refill: 1  2. B12 deficiency B12 injection is given today - cyanocobalamin ((VITAMIN B-12)) injection 1,000 mcg  3. BMI 32.0-32.9,adult Clinical status was not approved we will start her on ketamine since she did not have success in the past side effects and long-term consequences are discussed patient might need 2D echo cardiogram in future - phentermine 37.5 MG capsule; Take 1 capsule (37.5 mg total) by mouth every morning.  Dispense: 30 capsule; Refill: 0  4. Benign hypertension Elevated baseline blood pressure we will add carvedilol 3.125 mg once a day and then will increase if needed - carvedilol (COREG) 3.125 MG tablet; Take 1 tablet (3.125 mg total) by mouth 2 (two) times daily with a meal.  Dispense: 60 tablet; Refill: 1   General Counseling: Rashana verbalizes understanding of the findings of todays visit and agrees with plan of treatment. I have discussed any further diagnostic evaluation that may be needed or ordered today. We also reviewed her medications today. she has been encouraged to call the office with any questions or concerns that should arise related to todays visit.    No  orders of the defined types were placed in this encounter.   Meds ordered this encounter  Medications   phentermine 37.5 MG capsule    Sig: Take 1 capsule (37.5 mg total) by mouth every morning.    Dispense:  30 capsule    Refill:  0   topiramate (TOPAMAX) 25 MG tablet    Sig: Take one tab po qd with supper for headaches    Dispense:  90 tablet    Refill:  1   carvedilol (COREG) 3.125 MG tablet    Sig: Take 1 tablet (3.125 mg total) by mouth 2 (two) times daily with a meal.    Dispense:  60 tablet  Refill:  1    Total time spent:30 Minutes Time spent includes review of chart, medications, test results, and follow up plan with the patient.   [place] Controlled Substance Database was reviewed by me.   Dr Lavera Guise Internal medicine

## 2021-07-02 ENCOUNTER — Inpatient Hospital Stay: Payer: BC Managed Care – PPO | Attending: Oncology

## 2021-07-02 NOTE — Progress Notes (Signed)
Therapeutic phlebotomy performed. Removed 500 ml of blood. Pt accepted a beverage. Tolerated procedure well. VSS at time of discharge.

## 2021-07-02 NOTE — Patient Instructions (Signed)
Therapeutic Phlebotomy, Care After This sheet gives you information about how to care for yourself after your procedure. Your health care provider may also give you more specific instructions. If you have problems or questions, contact your health care provider. What can I expect after the procedure? After the procedure, it is common to have: Light-headedness or dizziness. You may feel faint. Nausea. Tiredness (fatigue). Follow these instructions at home: Eating and drinking Be sure to eat well-balanced meals for the next 24 hours. Drink enough fluid to keep your urine pale yellow. Avoid drinking alcohol on the day that you had the procedure. Activity  Return to your normal activities as told by your health care provider. Most people can go back to their normal activities right away. Avoid activities that take a lot of effort for about 5 hours after the procedure. Athletes should avoid strenuous exercise for at least 12 hours. Avoid heavy lifting or pulling for about 5 hours after the procedure. Do not lift anything that is heavier than 10 lb (4.5 kg). Change positions slowly for the remainder of the day. This will help to prevent light-headedness or fainting. If you feel light-headed, lie down until the feeling goes away. Needle insertion site care  Keep your bandage (dressing) dry. You can remove the bandage after about 5 hours or as told by your health care provider. If you have bleeding from the needle insertion site, raise (elevate) your arm and press firmly on the site until the bleeding stops. If you have bruising at the site, apply ice to the area: Remove the dressing. Put ice in a plastic bag. Place a towel between your skin and the bag. Leave the ice on for 20 minutes, 2-3 times a day for the first 24 hours. If the swelling does not go away after 24 hours, apply a warm, moist cloth (warm compress) to the area for 20 minutes, 2-3 times a day. General instructions Do not use any  products that contain nicotine or tobacco, such as cigarettes and e-cigarettes, for at least 30 minutes after the procedure. Keep all follow-up visits as told by your health care provider. This is important. You may need to continue having regular therapeutic phlebotomy treatments as directed. Contact a health care provider if you: Have redness, swelling, or pain at the needle insertion site. Have fluid or blood coming from the needle insertion site. Have pus or a bad smell coming from the needle insertion site. Notice that the needle insertion site feels warm to the touch. Feel light-headed, dizzy, or nauseous, and the feeling does not go away. Have new bruising at the needle insertion site. Feel weaker than normal. Have a fever or chills. Get help right away if: You faint. You have chest pain. You have trouble breathing. You have severe nausea or vomiting. Summary After the procedure, it is common to have some light-headedness, dizziness, nausea, or tiredness (fatigue). Be sure to eat well-balanced meals for the next 24 hours. Drink enough fluid to keep your urine pale yellow. Return to your normal activities as told by your health care provider. Keep all follow-up visits as told by your health care provider. You may need to continue having regular therapeutic phlebotomy treatments as directed. This information is not intended to replace advice given to you by your health care provider. Make sure you discuss any questions you have with your health care provider. Document Revised: 01/19/2021 Document Reviewed: 10/26/2017 Elsevier Patient Education  2022 [employer].

## 2021-07-05 ENCOUNTER — Ambulatory Visit: Payer: BC Managed Care – PPO | Admitting: Internal Medicine

## 2021-07-09 ENCOUNTER — Inpatient Hospital Stay: Payer: BC Managed Care – PPO

## 2021-07-09 NOTE — Patient Instructions (Signed)

## 2021-07-09 NOTE — Progress Notes (Signed)
Therapeutic phlebotomy performed. Removed 500 ml of blood per order. Patient tolerated procedure well. Accepted a beverage. Discharged to home.

## 2021-07-26 ENCOUNTER — Other Ambulatory Visit: Payer: Self-pay

## 2021-07-26 ENCOUNTER — Ambulatory Visit: Payer: BC Managed Care – PPO | Admitting: Internal Medicine

## 2021-07-26 ENCOUNTER — Encounter: Payer: Self-pay | Admitting: Internal Medicine

## 2021-07-26 VITALS — BP 128/80 | HR 62 | Temp 97.8°F | Resp 16 | Ht 63.0 in | Wt 180.0 lb

## 2021-07-26 DIAGNOSIS — Z6831 Body mass index (BMI) 31.0-31.9, adult: Secondary | ICD-10-CM

## 2021-07-26 DIAGNOSIS — G43009 Migraine without aura, not intractable, without status migrainosus: Secondary | ICD-10-CM | POA: Diagnosis not present

## 2021-07-26 DIAGNOSIS — E538 Deficiency of other specified B group vitamins: Secondary | ICD-10-CM | POA: Diagnosis not present

## 2021-07-26 DIAGNOSIS — Z6832 Body mass index (BMI) 32.0-32.9, adult: Secondary | ICD-10-CM

## 2021-07-26 MED ORDER — PHENTERMINE HCL 37.5 MG PO CAPS: 37.5000 mg | ORAL_CAPSULE | ORAL | 0 refills | Status: DC

## 2021-07-26 MED ORDER — CYANOCOBALAMIN 1000 MCG/ML IJ SOLN
1000.0000 ug | Freq: Once | INTRAMUSCULAR | Status: AC
  Administered 2021-07-26: 1000 ug via INTRAMUSCULAR

## 2021-07-26 MED ORDER — TOPIRAMATE 50 MG PO TABS: ORAL_TABLET | ORAL | 3 refills | Status: DC

## 2021-07-26 NOTE — Progress Notes (Signed)
[facility] [place], [place] [address]  Internal MEDICINE  Office Visit Note  Patient Name: Jennifer Barron  [id number]  [id number]  Date of Service: 08/02/2021  Chief Complaint  Patient presents with   Medical Management of Chronic Issues    Weight management    Migraine    HPI Pt is here for routine follow-up today, on previous visit her heart rate and blood pressure was elevated and she was started on Coreg 3.125 mg twice a day however patient was unable to tolerate this medication she dropped her blood pressure and felt dizzy.  Stopped taking medication and monitoring her blood pressure at home which is within range. Patient is complaining of headaches on a regular basis and will like to start with therapy. .  She is doing well with her weight loss on phentermine. Patient is being followed up by hematology for her hemochromatosis    Current Medication: Outpatient Encounter Medications as of 07/26/2021  Medication Sig   carvedilol (COREG) 3.125 MG tablet Take 1 tablet (3.125 mg total) by mouth 2 (two) times daily with a meal.   cholecalciferol (VITAMIN D) 1000 units tablet Take 1 tablet (1,000 Units total) by mouth daily.   spironolactone (ALDACTONE) 50 MG tablet    [DISCONTINUED] phentermine 37.5 MG capsule Take 1 capsule (37.5 mg total) by mouth every morning.   [DISCONTINUED] topiramate (TOPAMAX) 25 MG tablet Take one tab po qd with supper for headaches   phentermine 37.5 MG capsule Take 1 capsule (37.5 mg total) by mouth every morning.   topiramate (TOPAMAX) 50 MG tablet Take one tab po qd with supper for headaches   [EXPIRED] cyanocobalamin ((VITAMIN B-12)) injection 1,000 mcg    No facility-administered encounter medications on file as of 07/26/2021.    Surgical History: Past Surgical History:  Procedure Laterality Date   abdominal laproscopy     ADENOIDECTOMY     twice   TONSILLECTOMY Bilateral 1987   WISDOM TOOTH EXTRACTION  2009     Medical History: Past Medical History:  Diagnosis Date   Endometriosis    Migraine     Family History: Family History  Problem Relation Age of Onset   Ovarian cancer Paternal Grandmother    Cancer Paternal Grandmother    Prostate cancer Paternal Grandfather    Cancer Paternal Grandfather    Cancer Father     Social History   Socioeconomic History   Marital status: Married    Spouse name: Not on file   Number of children: Not on file   Years of education: Not on file   Highest education level: Not on file  Occupational History   Not on file  Tobacco Use   Smoking status: Never   Smokeless tobacco: Never  Vaping Use   Vaping Use: Never used  Substance and Sexual Activity   Alcohol use: Yes    Comment: social   Drug use: No   Sexual activity: Yes  Other Topics Concern   Not on file  Social History Narrative   Not on file   Social Determinants of Health   Financial Resource Strain: Not on file  Food Insecurity: Not on file  Transportation Needs: Not on file  Physical Activity: Not on file  Stress: Not on file  Social Connections: Not on file  Intimate Partner Violence: Not on file      Review of Systems  Constitutional:  Negative for chills, fatigue and unexpected weight change.  HENT:  Positive for  postnasal drip. Negative for congestion, rhinorrhea, sneezing and sore throat.   Eyes:  Negative for redness.  Respiratory:  Negative for cough, chest tightness and shortness of breath.   Cardiovascular:  Negative for chest pain and palpitations.  Gastrointestinal:  Negative for abdominal pain, constipation, diarrhea, nausea and vomiting.  Genitourinary:  Negative for dysuria and frequency.  Musculoskeletal:  Negative for arthralgias, back pain, joint swelling and neck pain.  Skin:  Negative for rash.  Neurological: Negative.  Negative for tremors and numbness.  Hematological:  Negative for adenopathy. Does not bruise/bleed easily.   Psychiatric/Behavioral:  Negative for behavioral problems (Depression), sleep disturbance and suicidal ideas. The patient is not nervous/anxious.    Vital Signs: BP 128/80   Pulse 62   Temp 97.8 F (36.6 C)   Resp 16   Ht 5' 3"  (1.6 m)   Wt 180 lb (81.6 kg)   SpO2 97%   BMI 31.89 kg/m    Physical Exam Constitutional:      Appearance: Normal appearance.  HENT:     Head: Normocephalic and atraumatic.     Nose: Nose normal.     Mouth/Throat:     Mouth: Mucous membranes are moist.     Pharynx: No posterior oropharyngeal erythema.  Eyes:     Extraocular Movements: Extraocular movements intact.     Pupils: Pupils are equal, round, and reactive to light.  Cardiovascular:     Pulses: Normal pulses.     Heart sounds: Normal heart sounds.  Pulmonary:     Effort: Pulmonary effort is normal.     Breath sounds: Normal breath sounds.  Neurological:     General: No focal deficit present.     Mental Status: She is alert.  Psychiatric:        Mood and Affect: Mood normal.        Behavior: Behavior normal.       Assessment/Plan: 1. Migraine without aura and without status migrainosus, not intractable Will continue on Topamax to 50 mg at bedtime - topiramate (TOPAMAX) 50 MG tablet; Take one tab po qd with supper for headaches  Dispense: 30 tablet; Refill: 3  2. B12 deficiency Replacement is done today - cyanocobalamin ((VITAMIN B-12)) injection 1,000 mcg  3. BMI 31.0-31.9,adult Continue to monitor her weight loss, intermittent fasting along with increasing activity 10,000 steps is encouraged - phentermine 37.5 MG capsule; Take 1 capsule (37.5 mg total) by mouth every morning.  Dispense: 30 capsule; Refill: 0  4. Hereditary hemochromatosis [facility]) Patient is followed by hematology  General Counseling: Jennifer Barron verbalizes understanding of the findings of todays visit and agrees with plan of treatment. I have discussed any further diagnostic evaluation that may be needed or  ordered today. We also reviewed her medications today. she has been encouraged to call the office with any questions or concerns that should arise related to todays visit.    No orders of the defined types were placed in this encounter.   Meds ordered this encounter  Medications   cyanocobalamin ((VITAMIN B-12)) injection 1,000 mcg   topiramate (TOPAMAX) 50 MG tablet    Sig: Take one tab po qd with supper for headaches    Dispense:  30 tablet    Refill:  3   phentermine 37.5 MG capsule    Sig: Take 1 capsule (37.5 mg total) by mouth every morning.    Dispense:  30 capsule    Refill:  0    Total time spent:30 Minutes Time spent includes review  of chart, medications, test results, and follow up plan with the patient.   [place] Controlled Substance Database was reviewed by me.   Dr Lavera Guise Internal medicine

## 2021-07-27 ENCOUNTER — Ambulatory Visit: Payer: BC Managed Care – PPO | Admitting: Internal Medicine

## 2021-08-09 ENCOUNTER — Encounter: Payer: BC Managed Care – PPO | Admitting: Physician Assistant

## 2021-08-30 ENCOUNTER — Ambulatory Visit (INDEPENDENT_AMBULATORY_CARE_PROVIDER_SITE_OTHER): Payer: BC Managed Care – PPO

## 2021-08-30 ENCOUNTER — Other Ambulatory Visit: Payer: Self-pay

## 2021-08-30 DIAGNOSIS — E538 Deficiency of other specified B group vitamins: Secondary | ICD-10-CM

## 2021-08-30 MED ORDER — CYANOCOBALAMIN 1000 MCG/ML IJ SOLN
1000.0000 ug | Freq: Once | INTRAMUSCULAR | Status: AC
  Administered 2021-08-30: 1000 ug via INTRAMUSCULAR

## 2021-09-10 ENCOUNTER — Other Ambulatory Visit: Payer: BC Managed Care – PPO

## 2021-09-15 ENCOUNTER — Other Ambulatory Visit: Payer: Self-pay

## 2021-09-15 ENCOUNTER — Inpatient Hospital Stay: Payer: BC Managed Care – PPO | Attending: Oncology

## 2021-09-15 DIAGNOSIS — R7401 Elevation of levels of liver transaminase levels: Secondary | ICD-10-CM | POA: Diagnosis not present

## 2021-09-15 LAB — CBC WITH DIFFERENTIAL/PLATELET
Abs Immature Granulocytes: 0.01 10*3/uL (ref 0.00–0.07)
Basophils Absolute: 0 10*3/uL (ref 0.0–0.1)
Basophils Relative: 0 %
Eosinophils Absolute: 0.1 10*3/uL (ref 0.0–0.5)
Eosinophils Relative: 2 %
HCT: 45 % (ref 36.0–46.0)
Hemoglobin: 15.3 g/dL — ABNORMAL HIGH (ref 12.0–15.0)
Immature Granulocytes: 0 %
Lymphocytes Relative: 36 %
Lymphs Abs: 1.8 10*3/uL (ref 0.7–4.0)
MCH: 32.8 pg (ref 26.0–34.0)
MCHC: 34 g/dL (ref 30.0–36.0)
MCV: 96.4 fL (ref 80.0–100.0)
Monocytes Absolute: 0.5 10*3/uL (ref 0.1–1.0)
Monocytes Relative: 10 %
Neutro Abs: 2.5 10*3/uL (ref 1.7–7.7)
Neutrophils Relative %: 52 %
Platelets: 290 10*3/uL (ref 150–400)
RBC: 4.67 MIL/uL (ref 3.87–5.11)
RDW: 11 % — ABNORMAL LOW (ref 11.5–15.5)
WBC: 4.9 10*3/uL (ref 4.0–10.5)
nRBC: 0 % (ref 0.0–0.2)

## 2021-09-15 LAB — HEPATIC FUNCTION PANEL
ALT: 42 U/L (ref 0–44)
AST: 45 U/L — ABNORMAL HIGH (ref 15–41)
Albumin: 4.3 g/dL (ref 3.5–5.0)
Alkaline Phosphatase: 42 U/L (ref 38–126)
Bilirubin, Direct: <0.1 mg/dL (ref 0.0–0.2)
Total Bilirubin: 0.3 mg/dL (ref 0.3–1.2)
Total Protein: 7.4 g/dL (ref 6.5–8.1)

## 2021-09-15 LAB — FERRITIN: Ferritin: 36 ng/mL (ref 11–307)

## 2021-09-15 LAB — IRON AND TIBC
Iron: 103 ug/dL (ref 28–170)
Saturation Ratios: 24 % (ref 10.4–31.8)
TIBC: 434 ug/dL (ref 250–450)
UIBC: 331 ug/dL

## 2021-09-20 ENCOUNTER — Inpatient Hospital Stay (HOSPITAL_BASED_OUTPATIENT_CLINIC_OR_DEPARTMENT_OTHER): Payer: BC Managed Care – PPO | Admitting: Oncology

## 2021-09-20 ENCOUNTER — Other Ambulatory Visit: Payer: Self-pay

## 2021-09-20 ENCOUNTER — Encounter: Payer: Self-pay | Admitting: Oncology

## 2021-09-20 ENCOUNTER — Inpatient Hospital Stay: Payer: BC Managed Care – PPO

## 2021-09-20 VITALS — BP 142/99 | HR 82 | Temp 97.4°F | Resp 17

## 2021-09-20 DIAGNOSIS — Z148 Genetic carrier of other disease: Secondary | ICD-10-CM | POA: Diagnosis not present

## 2021-09-20 DIAGNOSIS — R7401 Elevation of levels of liver transaminase levels: Secondary | ICD-10-CM | POA: Diagnosis not present

## 2021-09-20 DIAGNOSIS — R790 Abnormal level of blood mineral: Secondary | ICD-10-CM | POA: Diagnosis not present

## 2021-09-20 NOTE — Progress Notes (Signed)
Patient here for oncology follow-up appointment, concerns of neuropathy and fatigue

## 2021-09-20 NOTE — Progress Notes (Signed)
Hematology/Oncology Follow up note [facility] [phone] [phone]   Patient Care Team: Lavera Guise, MD as PCP - General (Internal Medicine)  REFERRING PROVIDER: Lavera Guise, MD REASON FOR VISIT Follow up for treatment of hemochromatosis.    HISTORY OF PRESENTING ILLNESS:  Jennifer Barron is a  40 y.o.  female with PMH listed below who was referred to me for evaluation of elevated ferritin. Patient follows up with GYN for IUD placement, and was fine to have abnormal liver function test, mainly elevated transaminitis.  Patient went to primary care physician Dr. Humphrey Rolls And had additional testing.  She was found to have elevated ferritin level at 547, iron saturation 33, ultrasound of abdomen was done on January 12, 2018, which showed upper normal limits spleen size and increased echogenicity of liver questionable fatty liver. Patient was referred to [facility] for further evaluation of elevated ferritin. Currently patient takes spironolactone for acne and this was started by dermatologist a week ago.  She also takes phentermine for obesity, recently started. Patient reports feeling tired fatigue.  Denies any joint pain shortness of breath, lower extremity swelling.  She denies any family history of hemochromatosis.  INTERVAL HISTORY Jennifer Barron is a 40 y.o. female who has above history reviewed by me today presents for follow-up visit for heterozygous hemochromatosis, elevated iron saturation.  S/p phlebotomy.  No new complaints.     Review of Systems  Constitutional:  Positive for malaise/fatigue. Negative for chills, fever and weight loss.  HENT:  Negative for sore throat.   Eyes:  Negative for redness.  Respiratory:  Negative for cough, shortness of breath and wheezing.   Cardiovascular:  Negative for chest pain, palpitations and leg swelling.  Gastrointestinal:  Negative for abdominal pain, blood in stool, nausea and vomiting.   Genitourinary:  Negative for dysuria.  Musculoskeletal:  Negative for myalgias.  Skin:  Negative for rash.  Neurological:  Negative for dizziness, tingling and tremors.  Endo/Heme/Allergies:  Does not bruise/bleed easily.  Psychiatric/Behavioral:  Negative for hallucinations.    MEDICAL HISTORY:  Past Medical History:  Diagnosis Date   Endometriosis    Migraine     SURGICAL HISTORY: Past Surgical History:  Procedure Laterality Date   abdominal laproscopy     ADENOIDECTOMY     twice   TONSILLECTOMY Bilateral 1987   WISDOM TOOTH EXTRACTION  2009    SOCIAL HISTORY: Social History   Socioeconomic History   Marital status: Married    Spouse name: Not on file   Number of children: Not on file   Years of education: Not on file   Highest education level: Not on file  Occupational History   Not on file  Tobacco Use   Smoking status: Never   Smokeless tobacco: Never  Vaping Use   Vaping Use: Never used  Substance and Sexual Activity   Alcohol use: Yes    Comment: social   Drug use: No   Sexual activity: Yes  Other Topics Concern   Not on file  Social History Narrative   Not on file   Social Determinants of Health   Financial Resource Strain: Not on file  Food Insecurity: Not on file  Transportation Needs: Not on file  Physical Activity: Not on file  Stress: Not on file  Social Connections: Not on file  Intimate Partner Violence: Not on file    FAMILY HISTORY: Family History  Problem Relation Age of Onset   Ovarian cancer Paternal Grandmother  Cancer Paternal Grandmother    Prostate cancer Paternal Grandfather    Cancer Paternal Grandfather    Cancer Father     ALLERGIES:  has No Known Allergies.  MEDICATIONS:  Current Outpatient Medications  Medication Sig Dispense Refill   cholecalciferol (VITAMIN D) 1000 units tablet Take 1 tablet (1,000 Units total) by mouth daily. 90 tablet 0   phentermine 37.5 MG capsule Take 1 capsule (37.5 mg total) by  mouth every morning. 30 capsule 0   spironolactone (ALDACTONE) 50 MG tablet   4   topiramate (TOPAMAX) 50 MG tablet Take one tab po qd with supper for headaches 30 tablet 3   carvedilol (COREG) 3.125 MG tablet Take 1 tablet (3.125 mg total) by mouth 2 (two) times daily with a meal. (Patient not taking: Reported on 09/20/2021) 60 tablet 1   No current facility-administered medications for this visit.     PHYSICAL EXAMINATION: ECOG PERFORMANCE STATUS: 1 - Symptomatic but completely ambulatory Vitals:   09/20/21 1312  BP: (!) 142/99  Pulse: 82  Resp: 17  Temp: (!) 97.4 F (36.3 C)  SpO2: 94%   There were no vitals filed for this visit.   Physical Exam Constitutional:      General: She is not in acute distress. HENT:     Head: Normocephalic and atraumatic.  Eyes:     General: No scleral icterus.    Conjunctiva/sclera: Conjunctivae normal.     Pupils: Pupils are equal, round, and reactive to light.  Cardiovascular:     Rate and Rhythm: Normal rate and regular rhythm.     Heart sounds: Normal heart sounds. No murmur heard.   No friction rub. No gallop.  Pulmonary:     Effort: Pulmonary effort is normal. No respiratory distress.     Breath sounds: Normal breath sounds. No wheezing or rales.  Chest:     Chest wall: No tenderness.  Abdominal:     General: Bowel sounds are normal. There is no distension.     Palpations: Abdomen is soft. There is no mass.     Tenderness: There is no abdominal tenderness.  Musculoskeletal:        General: No deformity. Normal range of motion.     Cervical back: Normal range of motion and neck supple.  Lymphadenopathy:     Cervical: No cervical adenopathy.  Skin:    General: Skin is warm and dry.     Findings: No erythema or rash.  Neurological:     Mental Status: She is alert and oriented to person, place, and time.     Cranial Nerves: No cranial nerve deficit.     Coordination: Coordination normal.     Deep Tendon Reflexes: Reflexes  normal.  Psychiatric:        Behavior: Behavior normal.        Thought Content: Thought content normal.     LABORATORY DATA:  I have reviewed the data as listed Lab Results  Component Value Date   WBC 4.9 09/15/2021   HGB 15.3 (H) 09/15/2021   HCT 45.0 09/15/2021   MCV 96.4 09/15/2021   PLT 290 09/15/2021   Recent Labs    05/05/21 0740 06/21/21 0816 09/15/21 0824  NA 135 138  --   K 4.4 4.2  --   CL 99 101  --   CO2 23 30  --   GLUCOSE 89 90  --   BUN 11 9  --   CREATININE 0.89 0.79  --  CALCIUM 9.6 9.3  --   GFRNONAA  --  >60  --   PROT 6.8 7.4 7.4  ALBUMIN 4.7 4.6 4.3  AST 46* 51* 45*  ALT 53* 50* 42  ALKPHOS 52 43 42  BILITOT 1.2 1.1 0.3  BILIDIR  --   --  <0.1  IBILI  --   --  NOT CALCULATED     Hepatitis B surface antigen negative, hepatitis C RNA nondetectable.  01/13/2108 US abdomen showed upper normal limits spleen size and increased echogenicity of liver questionable fatty liver.  ASSESSMENT & PLAN:  1. Abnormal iron saturation   2. Hemochromatosis carrier   3. Transaminitis    #Heterozygous H63D hemochromatosis/abnormal iron labs/transaminitis Labs reviewed and discussed with patient. Patient has had phlebotomy. Ferritin has decreased to 36, iron saturation normalized to 24. Transaminitis has improved. Discussed with the patient that majority patients with heterozygous hemochromatosis carrier do not develop iron overload symptoms and usually do not require frequent phlebotomy. She received empiric phlebotomy due to concomitant transaminitis as well as abnormal iron labs. I will hold off phlebotomy at this point given that her numbers have improved.  Transaminitis could be secondary to fatty liver disease as well.  Life modification has been discussed with patient. Avoid alcohol.  All questions were answered. The patient knows to call the clinic with any problems questions or concerns.  Orders Placed This Encounter  Procedures   CBC with  Differential/Platelet    Standing Status:   Future    Standing Expiration Date:   09/20/2022   Ferritin    Standing Status:   Future    Standing Expiration Date:   09/20/2022   Iron and TIBC    Standing Status:   Future    Standing Expiration Date:   09/20/2022   Hepatic function panel    Standing Status:   Future    Standing Expiration Date:   09/20/2022   Follow-up in 4 months.  Earlie Server, MD, PhD  09/20/2021

## 2021-09-22 ENCOUNTER — Telehealth: Payer: BC Managed Care – PPO | Admitting: Family

## 2021-09-22 DIAGNOSIS — T63301A Toxic effect of unspecified spider venom, accidental (unintentional), initial encounter: Secondary | ICD-10-CM | POA: Diagnosis not present

## 2021-09-22 MED ORDER — DOXYCYCLINE HYCLATE 100 MG PO TABS
100.0000 mg | ORAL_TABLET | Freq: Two times a day (BID) | ORAL | 0 refills | Status: DC
Start: 2021-09-22 — End: 2022-01-17

## 2021-09-22 NOTE — Progress Notes (Signed)
Virtual Visit Consent   [name], you are scheduled for a virtual visit with a [place] provider today.     Just as with appointments in the office, your consent must be obtained to participate.  Your consent will be active for this visit and any virtual visit you may have with one of our providers in the next 365 days.     If you have a MyChart account, a copy of this consent can be sent to you electronically.  All virtual visits are billed to your insurance company just like a traditional visit in the office.    As this is a virtual visit, video technology does not allow for your provider to perform a traditional examination.  This may limit your provider's ability to fully assess your condition.  If your provider identifies any concerns that need to be evaluated in person or the need to arrange testing (such as labs, EKG, etc.), we will make arrangements to do so.     Although advances in technology are sophisticated, we cannot ensure that it will always work on either your end or our end.  If the connection with a video visit is poor, the visit may have to be switched to a telephone visit.  With either a video or telephone visit, we are not always able to ensure that we have a secure connection.     I need to obtain your verbal consent now.   Are you willing to proceed with your visit today?    Jennifer Barron has provided verbal consent on 09/22/2021 for a virtual visit (video or telephone).   Evelina Dun, FNP   Date: 09/22/2021 4:42 PM   Virtual Visit via Video Note   I, Evelina Dun, connected with  Jennifer Barron  ([id number], [birth year]) on 09/22/21 at  4:45 PM EST by a video-enabled telemedicine application and verified that I am speaking with the correct person using two identifiers.  Location: Patient: Virtual Visit Location Patient: Other: work Provider: Ecologist: Home Office   I discussed the limitations of evaluation and  management by telemedicine and the availability of in person appointments. The patient expressed understanding and agreed to proceed.    History of Present Illness: Jennifer Barron is a 40 y.o. who identifies as a female who was assigned female at birth, and is being seen today for spider bite that happened three weeks ago on left lower leg. She has kept the area clean and antibiotic ointment with mild relief. She reports the area has worsen. She reports yellow discharge. She reports burning pain of 6 out 10.   HPI: HPI  Problems:  Patient Active Problem List   Diagnosis Date Noted   Hyperbilirubinemia 03/12/2018   Vitamin D deficiency 03/12/2018   Iron overload 02/13/2018   Hereditary hemochromatosis ([place]) 02/13/2018   Endometriosis 01/23/2018    Allergies: No Known Allergies Medications:  Current Outpatient Medications:    doxycycline (VIBRA-TABS) 100 MG tablet, Take 1 tablet (100 mg total) by mouth 2 (two) times daily., Disp: 20 tablet, Rfl: 0   cholecalciferol (VITAMIN D) 1000 units tablet, Take 1 tablet (1,000 Units total) by mouth daily., Disp: 90 tablet, Rfl: 0   phentermine 37.5 MG capsule, Take 1 capsule (37.5 mg total) by mouth every morning., Disp: 30 capsule, Rfl: 0   spironolactone (ALDACTONE) 50 MG tablet, , Disp: , Rfl: 4   topiramate (TOPAMAX) 50 MG tablet, Take one tab po qd with supper for  headaches, Disp: 30 tablet, Rfl: 3  Observations/Objective: Patient is well-developed, well-nourished in no acute distress.  Resting comfortably  at home.  Head is normocephalic, atraumatic.  No labored breathing.  Speech is clear and coherent with logical content.  Patient is alert and oriented at baseline.  Skin lesion on lateral left leg, ulcer like with erythemas border  Assessment and Plan: 1. Spider bite wound, accidental or unintentional, initial encounter - doxycycline (VIBRA-TABS) 100 MG tablet; Take 1 tablet (100 mg total) by mouth 2 (two) times daily.  Dispense:  20 tablet; Refill: 0 Keep clean and dry Report increase in redness, fever, or drainage She will circle are with sharpie  She has follow up with PCP on Monday  Follow Up Instructions: I discussed the assessment and treatment plan with the patient. The patient was provided an opportunity to ask questions and all were answered. The patient agreed with the plan and demonstrated an understanding of the instructions.  A copy of instructions were sent to the patient via MyChart unless otherwise noted below.     The patient was advised to call back or seek an in-person evaluation if the symptoms worsen or if the condition fails to improve as anticipated.  Time:  I spent 11 minutes with the patient via telehealth technology discussing the above problems/concerns.    Evelina Dun, FNP

## 2021-09-27 ENCOUNTER — Other Ambulatory Visit: Payer: Self-pay

## 2021-09-27 ENCOUNTER — Ambulatory Visit: Payer: BC Managed Care – PPO

## 2021-09-27 ENCOUNTER — Encounter: Payer: Self-pay | Admitting: Physician Assistant

## 2021-09-27 ENCOUNTER — Ambulatory Visit (INDEPENDENT_AMBULATORY_CARE_PROVIDER_SITE_OTHER): Payer: BC Managed Care – PPO | Admitting: Physician Assistant

## 2021-09-27 DIAGNOSIS — E785 Hyperlipidemia, unspecified: Secondary | ICD-10-CM | POA: Diagnosis not present

## 2021-09-27 DIAGNOSIS — E538 Deficiency of other specified B group vitamins: Secondary | ICD-10-CM

## 2021-09-27 DIAGNOSIS — I10 Essential (primary) hypertension: Secondary | ICD-10-CM | POA: Diagnosis not present

## 2021-09-27 DIAGNOSIS — E559 Vitamin D deficiency, unspecified: Secondary | ICD-10-CM

## 2021-09-27 DIAGNOSIS — G43009 Migraine without aura, not intractable, without status migrainosus: Secondary | ICD-10-CM | POA: Diagnosis not present

## 2021-09-27 DIAGNOSIS — Z6831 Body mass index (BMI) 31.0-31.9, adult: Secondary | ICD-10-CM

## 2021-09-27 DIAGNOSIS — R5383 Other fatigue: Secondary | ICD-10-CM

## 2021-09-27 DIAGNOSIS — T63301D Toxic effect of unspecified spider venom, accidental (unintentional), subsequent encounter: Secondary | ICD-10-CM

## 2021-09-27 MED ORDER — PHENTERMINE HCL 37.5 MG PO CAPS: 37.5000 mg | ORAL_CAPSULE | ORAL | 0 refills | Status: DC

## 2021-09-27 MED ORDER — CYANOCOBALAMIN 1000 MCG/ML IJ SOLN
1000.0000 ug | Freq: Once | INTRAMUSCULAR | Status: AC
  Administered 2021-09-27: 1000 ug via INTRAMUSCULAR

## 2021-09-27 MED ORDER — TOPIRAMATE 50 MG PO TABS: ORAL_TABLET | ORAL | 3 refills | Status: DC

## 2021-09-27 NOTE — Progress Notes (Signed)
[facility] [place],  [place] [address]  Internal MEDICINE  Office Visit Note  Patient Name: Jennifer Barron  [id number]  [id number]  Date of Service: 09/29/2021  Chief Complaint  Patient presents with   Follow-up   Migraine   Medical Management of Chronic Issues    Weight management    HPI Pt is here for routine follow up on weight management. -Has not really been taking phentermine consistently since she had a rebound wt gain last time she did it. So was told she could take qod, but doesn't take that often. Will take it occasionally and has been working on diet and exercise and has lost 4 lbs since last visit. -She also states the topiramate is helping her sleep, lose weight, and is not having headaches at all since increasing to 30m. -spider bite? On left leg. Patient did not realize she was bit and actually thought she knocked her leg on something until she looked closer and saw what appears to be puncture marks. Did not see what bit her. Wednesday would be 3 weeks, and it wasn't better so she had a telehealth visit with another provider who prescribed doxycycline. It is still purple, but is scabbed and no longer draining. Taking the doxycyline still even though it doesn't make her feel great, she states it is tolerable and would rather finish it out. No area of fluctuance however the discoloration and timeframe warrants further evaluation. Patient states she has a dermatologist she sees and we discussed called them for evaluation. -Did get her flu shot and booster.  Current Medication: Outpatient Encounter Medications as of 09/27/2021  Medication Sig   cholecalciferol (VITAMIN D) 1000 units tablet Take 1 tablet (1,000 Units total) by mouth daily.   doxycycline (VIBRA-TABS) 100 MG tablet Take 1 tablet (100 mg total) by mouth 2 (two) times daily.   spironolactone (ALDACTONE) 50 MG tablet    [DISCONTINUED] phentermine 37.5 MG capsule Take 1 capsule (37.5  mg total) by mouth every morning.   [DISCONTINUED] topiramate (TOPAMAX) 50 MG tablet Take one tab po qd with supper for headaches   phentermine 37.5 MG capsule Take 1 capsule (37.5 mg total) by mouth every morning.   topiramate (TOPAMAX) 50 MG tablet Take one tab po qd with supper for headaches   [EXPIRED] cyanocobalamin ((VITAMIN B-12)) injection 1,000 mcg    No facility-administered encounter medications on file as of 09/27/2021.    Surgical History: Past Surgical History:  Procedure Laterality Date   abdominal laproscopy     ADENOIDECTOMY     twice   TONSILLECTOMY Bilateral 1987   WISDOM TOOTH EXTRACTION  2009    Medical History: Past Medical History:  Diagnosis Date   Endometriosis    Migraine     Family History: Family History  Problem Relation Age of Onset   Ovarian cancer Paternal Grandmother    Cancer Paternal Grandmother    Prostate cancer Paternal Grandfather    Cancer Paternal Grandfather    Cancer Father     Social History   Socioeconomic History   Marital status: Married    Spouse name: Not on file   Number of children: Not on file   Years of education: Not on file   Highest education level: Not on file  Occupational History   Not on file  Tobacco Use   Smoking status: Never   Smokeless tobacco: Never  Vaping Use   Vaping Use: Never used  Substance and Sexual Activity  Alcohol use: Yes    Comment: social   Drug use: No   Sexual activity: Yes  Other Topics Concern   Not on file  Social History Narrative   Not on file   Social Determinants of Health   Financial Resource Strain: Not on file  Food Insecurity: Not on file  Transportation Needs: Not on file  Physical Activity: Not on file  Stress: Not on file  Social Connections: Not on file  Intimate Partner Violence: Not on file      Review of Systems  Constitutional:  Negative for chills, fatigue, fever and unexpected weight change.  HENT:  Negative for congestion, mouth sores,  postnasal drip, rhinorrhea, sneezing and sore throat.   Eyes:  Negative for redness.  Respiratory:  Negative for cough, chest tightness and shortness of breath.   Cardiovascular:  Negative for chest pain and palpitations.  Gastrointestinal:  Negative for abdominal pain, constipation, diarrhea, nausea and vomiting.  Genitourinary:  Negative for dysuria, flank pain and frequency.  Musculoskeletal:  Negative for arthralgias, back pain, joint swelling and neck pain.  Skin:  Positive for wound. Negative for rash.       Purple bite site on left leg  Neurological: Negative.  Negative for tremors and numbness.  Hematological:  Negative for adenopathy. Does not bruise/bleed easily.  Psychiatric/Behavioral: Negative.  Negative for behavioral problems (Depression), sleep disturbance and suicidal ideas. The patient is not nervous/anxious.    Vital Signs: BP 122/86   Pulse 93   Temp 97.8 F (36.6 C)   Resp 16   Ht 5' 3"  (1.6 m)   Wt 176 lb (79.8 kg)   SpO2 95%   BMI 31.18 kg/m    Physical Exam Constitutional:      Appearance: Normal appearance.  HENT:     Head: Normocephalic and atraumatic.     Nose: Nose normal.     Mouth/Throat:     Mouth: Mucous membranes are moist.     Pharynx: No posterior oropharyngeal erythema.  Eyes:     Extraocular Movements: Extraocular movements intact.     Pupils: Pupils are equal, round, and reactive to light.  Cardiovascular:     Rate and Rhythm: Normal rate and regular rhythm.     Pulses: Normal pulses.     Heart sounds: Normal heart sounds.  Pulmonary:     Effort: Pulmonary effort is normal.     Breath sounds: Normal breath sounds.  Musculoskeletal:        General: Normal range of motion.  Skin:    Findings: Lesion present.     Comments: 1.5x1.5cm area of darker pigmentation with scabbed puncture mark in center. No fluctuance or tenderness to palpation  Neurological:     General: No focal deficit present.     Mental Status: She is alert.   Psychiatric:        Mood and Affect: Mood normal.        Behavior: Behavior normal.       Assessment/Plan: 1. Migraine without aura and without status migrainosus, not intractable Well controlled, continue topamax - topiramate (TOPAMAX) 50 MG tablet; Take one tab po qd with supper for headaches  Dispense: 30 tablet; Refill: 3  2. Benign hypertension BP has been stable without medication, continue to monitor  3. Spider bite wound, accidental or unintentional, subsequent encounter Continue doxycycline and f/u with dermatology  4. Hyperlipidemia, unspecified hyperlipidemia type - Lipid Panel With LDL/HDL Ratio  5. Vitamin D deficiency - VITAMIN D 25 Hydroxy (  Vit-D Deficiency, Fractures)  6. B12 deficiency - cyanocobalamin ((VITAMIN B-12)) injection 1,000 mcg - B12 and Folate Panel  7. Other fatigue - CBC w/Diff/Platelet - Comprehensive metabolic panel - TSH + free T4  8. BMI 31.0-31.9,adult May continue phentermine and continue topamax. Continue to work on diet and exercise. - phentermine 37.5 MG capsule; Take 1 capsule (37.5 mg total) by mouth every morning.  Dispense: 30 capsule; Refill: 0   General Counseling: Kamaile verbalizes understanding of the findings of todays visit and agrees with plan of treatment. I have discussed any further diagnostic evaluation that may be needed or ordered today. We also reviewed her medications today. she has been encouraged to call the office with any questions or concerns that should arise related to todays visit.    Orders Placed This Encounter  Procedures   CBC w/Diff/Platelet   Comprehensive metabolic panel   TSH + free T4   Lipid Panel With LDL/HDL Ratio   VITAMIN D 25 Hydroxy (Vit-D Deficiency, Fractures)   B12 and Folate Panel    Meds ordered this encounter  Medications   cyanocobalamin ((VITAMIN B-12)) injection 1,000 mcg   topiramate (TOPAMAX) 50 MG tablet    Sig: Take one tab po qd with supper for headaches     Dispense:  30 tablet    Refill:  3   phentermine 37.5 MG capsule    Sig: Take 1 capsule (37.5 mg total) by mouth every morning.    Dispense:  30 capsule    Refill:  0    This patient was seen by Drema Dallas, PA-C in collaboration with Dr. Clayborn Bigness as a part of collaborative care agreement.   Total time spent:35 Minutes Time spent includes review of chart, medications, test results, and follow up plan with the patient.      Dr Lavera Guise Internal medicine

## 2021-10-05 DIAGNOSIS — L280 Lichen simplex chronicus: Secondary | ICD-10-CM | POA: Diagnosis not present

## 2021-10-05 DIAGNOSIS — L97829 Non-pressure chronic ulcer of other part of left lower leg with unspecified severity: Secondary | ICD-10-CM | POA: Diagnosis not present

## 2021-10-25 DIAGNOSIS — E282 Polycystic ovarian syndrome: Secondary | ICD-10-CM | POA: Diagnosis not present

## 2021-10-25 DIAGNOSIS — L700 Acne vulgaris: Secondary | ICD-10-CM | POA: Diagnosis not present

## 2021-11-01 ENCOUNTER — Other Ambulatory Visit: Payer: Self-pay

## 2021-11-01 ENCOUNTER — Ambulatory Visit (INDEPENDENT_AMBULATORY_CARE_PROVIDER_SITE_OTHER): Payer: BC Managed Care – PPO

## 2021-11-01 DIAGNOSIS — E538 Deficiency of other specified B group vitamins: Secondary | ICD-10-CM

## 2021-11-01 MED ORDER — CYANOCOBALAMIN 1000 MCG/ML IJ SOLN
1000.0000 ug | Freq: Once | INTRAMUSCULAR | Status: AC
  Administered 2021-11-01: 1000 ug via INTRAMUSCULAR

## 2021-11-26 ENCOUNTER — Encounter: Payer: BC Managed Care – PPO | Admitting: Physician Assistant

## 2021-11-29 ENCOUNTER — Ambulatory Visit (INDEPENDENT_AMBULATORY_CARE_PROVIDER_SITE_OTHER): Payer: BC Managed Care – PPO

## 2021-11-29 ENCOUNTER — Other Ambulatory Visit: Payer: Self-pay

## 2021-11-29 DIAGNOSIS — E538 Deficiency of other specified B group vitamins: Secondary | ICD-10-CM

## 2021-11-29 MED ORDER — CYANOCOBALAMIN 1000 MCG/ML IJ SOLN
1000.0000 ug | Freq: Once | INTRAMUSCULAR | Status: AC
  Administered 2021-11-29: 1000 ug via INTRAMUSCULAR

## 2021-12-01 ENCOUNTER — Telehealth: Payer: Self-pay

## 2021-12-01 NOTE — Telephone Encounter (Signed)
Left vm to confirm 12/02/21 appointment-Toni

## 2021-12-02 ENCOUNTER — Encounter: Payer: BC Managed Care – PPO | Admitting: Physician Assistant

## 2021-12-17 ENCOUNTER — Encounter: Payer: BC Managed Care – PPO | Admitting: Physician Assistant

## 2021-12-27 ENCOUNTER — Other Ambulatory Visit: Payer: Self-pay

## 2021-12-27 ENCOUNTER — Ambulatory Visit: Payer: BC Managed Care – PPO

## 2021-12-27 DIAGNOSIS — E538 Deficiency of other specified B group vitamins: Secondary | ICD-10-CM

## 2021-12-27 MED ORDER — CYANOCOBALAMIN 1000 MCG/ML IJ SOLN
1000.0000 ug | Freq: Once | INTRAMUSCULAR | Status: AC
  Administered 2021-12-27: 1000 ug via INTRAMUSCULAR

## 2022-01-05 ENCOUNTER — Other Ambulatory Visit: Payer: Self-pay | Admitting: *Deleted

## 2022-01-05 DIAGNOSIS — Z148 Genetic carrier of other disease: Secondary | ICD-10-CM

## 2022-01-14 ENCOUNTER — Telehealth: Payer: Self-pay | Admitting: *Deleted

## 2022-01-14 ENCOUNTER — Inpatient Hospital Stay: Payer: BC Managed Care – PPO

## 2022-01-14 NOTE — Telephone Encounter (Signed)
Sent pt Mychart message to let her know of appt update.  ? ?

## 2022-01-14 NOTE — Telephone Encounter (Signed)
Patient left scheduling msg to cnl the lab apt today 01/14/22 at 11am. She requested a call back to r/s the labs on Monday morning 3/27 if possible. Pt stated that she was not "feeling well and needed to r/s." She preferred  to r/s the labs on Monday morning if possible. She would like to keep apts monday 3/27 in pm with dr. Tasia Catchings and phleb. ? ?I attempted to call patient back with new apts. no answer. left detailed vm and also sent mychart msg to patient.  Lab apt r/s to 8 am on Monday. ?

## 2022-01-17 ENCOUNTER — Inpatient Hospital Stay: Payer: BC Managed Care – PPO | Attending: Oncology | Admitting: Oncology

## 2022-01-17 ENCOUNTER — Inpatient Hospital Stay: Payer: BC Managed Care – PPO

## 2022-01-17 ENCOUNTER — Encounter: Payer: Self-pay | Admitting: Oncology

## 2022-01-17 ENCOUNTER — Other Ambulatory Visit: Payer: Self-pay

## 2022-01-17 ENCOUNTER — Telehealth: Payer: Self-pay

## 2022-01-17 VITALS — BP 134/97 | HR 78 | Temp 98.5°F | Resp 18 | Wt 186.8 lb

## 2022-01-17 DIAGNOSIS — Z148 Genetic carrier of other disease: Secondary | ICD-10-CM

## 2022-01-17 DIAGNOSIS — R790 Abnormal level of blood mineral: Secondary | ICD-10-CM | POA: Diagnosis not present

## 2022-01-17 DIAGNOSIS — R7401 Elevation of levels of liver transaminase levels: Secondary | ICD-10-CM | POA: Diagnosis not present

## 2022-01-17 LAB — HEPATIC FUNCTION PANEL
ALT: 93 U/L — ABNORMAL HIGH (ref 0–44)
AST: 81 U/L — ABNORMAL HIGH (ref 15–41)
Albumin: 4.1 g/dL (ref 3.5–5.0)
Alkaline Phosphatase: 46 U/L (ref 38–126)
Bilirubin, Direct: 0.1 mg/dL (ref 0.0–0.2)
Indirect Bilirubin: 0.6 mg/dL (ref 0.3–0.9)
Total Bilirubin: 0.7 mg/dL (ref 0.3–1.2)
Total Protein: 6.9 g/dL (ref 6.5–8.1)

## 2022-01-17 LAB — CBC WITH DIFFERENTIAL/PLATELET
Abs Immature Granulocytes: 0.02 10*3/uL (ref 0.00–0.07)
Basophils Absolute: 0 10*3/uL (ref 0.0–0.1)
Basophils Relative: 1 %
Eosinophils Absolute: 0.1 10*3/uL (ref 0.0–0.5)
Eosinophils Relative: 3 %
HCT: 42.7 % (ref 36.0–46.0)
Hemoglobin: 15.2 g/dL — ABNORMAL HIGH (ref 12.0–15.0)
Immature Granulocytes: 0 %
Lymphocytes Relative: 24 %
Lymphs Abs: 1.4 10*3/uL (ref 0.7–4.0)
MCH: 34.8 pg — ABNORMAL HIGH (ref 26.0–34.0)
MCHC: 35.6 g/dL (ref 30.0–36.0)
MCV: 97.7 fL (ref 80.0–100.0)
Monocytes Absolute: 0.6 10*3/uL (ref 0.1–1.0)
Monocytes Relative: 10 %
Neutro Abs: 3.5 10*3/uL (ref 1.7–7.7)
Neutrophils Relative %: 62 %
Platelets: 262 10*3/uL (ref 150–400)
RBC: 4.37 MIL/uL (ref 3.87–5.11)
RDW: 11.4 % — ABNORMAL LOW (ref 11.5–15.5)
WBC: 5.6 10*3/uL (ref 4.0–10.5)
nRBC: 0 % (ref 0.0–0.2)

## 2022-01-17 LAB — IRON AND TIBC
Iron: 127 ug/dL (ref 28–170)
Saturation Ratios: 28 % (ref 10.4–31.8)
TIBC: 461 ug/dL — ABNORMAL HIGH (ref 250–450)
UIBC: 334 ug/dL

## 2022-01-17 LAB — FERRITIN: Ferritin: 49 ng/mL (ref 11–307)

## 2022-01-17 NOTE — Progress Notes (Signed)
?Hematology/Oncology Progress note ?[phone] [phone] ?  ? ? ? ?Patient Care Team: ?Lavera Guise, MD as PCP - General (Internal Medicine) ? ?REFERRING PROVIDER: ?Lavera Guise, MD ?REASON FOR VISIT ?Follow up for treatment of hemochromatosis.  ? ? ?HISTORY OF PRESENTING ILLNESS:  ?Jennifer Barron is a  41 y.o.  female with PMH listed below who was referred to me for evaluation of elevated ferritin. ?Patient follows up with GYN for IUD placement, and was fine to have abnormal liver function test, mainly elevated transaminitis.  Patient went to primary care physician Dr. Humphrey Rolls And had additional testing.  She was found to have elevated ferritin level at 547, iron saturation 33, ultrasound of abdomen was done on January 12, 2018, which showed upper normal limits spleen size and increased echogenicity of liver questionable fatty liver. ?Patient was referred to [facility] for further evaluation of elevated ferritin. ?Currently patient takes spironolactone for acne and this was started by dermatologist a week ago.  She also takes phentermine for obesity, recently started. ?Patient reports feeling tired fatigue.  Denies any joint pain shortness of breath, lower extremity swelling.  She denies any family history of hemochromatosis. ? ?INTERVAL HISTORY ?Jennifer Barron is a 41 y.o. female who has above history reviewed by me today presents for follow-up visit for heterozygous hemochromatosis, elevated iron saturation.  ?Well today.  No new complaints. ? ? ? ?Review of Systems  ?Constitutional:  Positive for malaise/fatigue. Negative for chills, fever and weight loss.  ?HENT:  Negative for sore throat.   ?Eyes:  Negative for redness.  ?Respiratory:  Negative for cough, shortness of breath and wheezing.   ?Cardiovascular:  Negative for chest pain, palpitations and leg swelling.  ?Gastrointestinal:  Negative for abdominal pain, blood in stool, nausea and vomiting.  ?Genitourinary:  Negative for  dysuria.  ?Musculoskeletal:  Negative for myalgias.  ?Skin:  Negative for rash.  ?Neurological:  Negative for dizziness, tingling and tremors.  ?Endo/Heme/Allergies:  Does not bruise/bleed easily.  ?Psychiatric/Behavioral:  Negative for hallucinations.   ? ?MEDICAL HISTORY:  ?Past Medical History:  ?Diagnosis Date  ? Endometriosis   ? Migraine   ? ? ?SURGICAL HISTORY: ?Past Surgical History:  ?Procedure Laterality Date  ? abdominal laproscopy    ? ADENOIDECTOMY    ? twice  ? TONSILLECTOMY Bilateral 1987  ? [place] EXTRACTION  2009  ? ? ?SOCIAL HISTORY: ?Social History  ? ?Socioeconomic History  ? Marital status: Married  ?  Spouse name: Not on file  ? Number of children: Not on file  ? Years of education: Not on file  ? Highest education level: Not on file  ?Occupational History  ? Not on file  ?Tobacco Use  ? Smoking status: Never  ? Smokeless tobacco: Never  ?Vaping Use  ? Vaping Use: Never used  ?Substance and Sexual Activity  ? Alcohol use: Yes  ?  Comment: social  ? Drug use: No  ? Sexual activity: Yes  ?Other Topics Concern  ? Not on file  ?Social History Narrative  ? Not on file  ? ?Social Determinants of Health  ? ?Financial Resource Strain: Not on file  ?Food Insecurity: Not on file  ?Transportation Needs: Not on file  ?Physical Activity: Not on file  ?Stress: Not on file  ?Social Connections: Not on file  ?Intimate Partner Violence: Not on file  ? ? ?FAMILY HISTORY: ?Family History  ?Problem Relation Age of Onset  ? Ovarian cancer Paternal Grandmother   ?  Cancer Paternal Grandmother   ? Prostate cancer Paternal Grandfather   ? Cancer Paternal Grandfather   ? Cancer Father   ? ? ?ALLERGIES:  has No Known Allergies. ? ?MEDICATIONS:  ?Current Outpatient Medications  ?Medication Sig Dispense Refill  ? cholecalciferol (VITAMIN D) 1000 units tablet Take 1 tablet (1,000 Units total) by mouth daily. 90 tablet 0  ? spironolactone (ALDACTONE) 50 MG tablet   4  ? ?No current facility-administered medications for  this visit.  ? ? ? ?PHYSICAL EXAMINATION: ?ECOG PERFORMANCE STATUS: 1 - Symptomatic but completely ambulatory ?Vitals:  ? 01/17/22 1451  ?BP: (!) 134/97  ?Pulse: 78  ?Resp: 18  ?Temp: 98.5 ?F (36.9 ?C)  ? ?Filed Weights  ? 01/17/22 1451  ?Weight: 186 lb 12.8 oz (84.7 kg)  ? ? ? ?Physical Exam ?Constitutional:   ?   General: She is not in acute distress. ?HENT:  ?   Head: Normocephalic and atraumatic.  ?Eyes:  ?   General: No scleral icterus. ?   Conjunctiva/sclera: Conjunctivae normal.  ?   Pupils: Pupils are equal, round, and reactive to light.  ?Cardiovascular:  ?   Rate and Rhythm: Normal rate and regular rhythm.  ?   Heart sounds: Normal heart sounds. No murmur heard. ?  No friction rub. No gallop.  ?Pulmonary:  ?   Effort: Pulmonary effort is normal. No respiratory distress.  ?   Breath sounds: Normal breath sounds. No wheezing or rales.  ?Chest:  ?   Chest wall: No tenderness.  ?Abdominal:  ?   General: Bowel sounds are normal. There is no distension.  ?   Palpations: Abdomen is soft. There is no mass.  ?   Tenderness: There is no abdominal tenderness.  ?Musculoskeletal:     ?   General: No deformity. Normal range of motion.  ?   Cervical back: Normal range of motion and neck supple.  ?Lymphadenopathy:  ?   Cervical: No cervical adenopathy.  ?Skin: ?   General: Skin is warm and dry.  ?   Findings: No erythema or rash.  ?Neurological:  ?   Mental Status: She is alert and oriented to person, place, and time.  ?   Cranial Nerves: No cranial nerve deficit.  ?   Coordination: Coordination normal.  ?   Deep Tendon Reflexes: Reflexes normal.  ?Psychiatric:     ?   Behavior: Behavior normal.     ?   Thought Content: Thought content normal.  ? ? ? ?LABORATORY DATA:  ?I have reviewed the data as listed ?Lab Results  ?Component Value Date  ? WBC 5.6 01/17/2022  ? HGB 15.2 (H) 01/17/2022  ? HCT 42.7 01/17/2022  ? MCV 97.7 01/17/2022  ? PLT 262 01/17/2022  ? ?Recent Labs  ?  05/05/21 ?0740 06/21/21 ?0816 09/15/21 ?0824  01/17/22 ?0806  ?NA 135 138  --   --   ?K 4.4 4.2  --   --   ?CL 99 101  --   --   ?CO2 23 30  --   --   ?GLUCOSE 89 90  --   --   ?BUN 11 9  --   --   ?CREATININE 0.89 0.79  --   --   ?CALCIUM 9.6 9.3  --   --   ?GFRNONAA  --  >60  --   --   ?PROT 6.8 7.4 7.4 6.9  ?ALBUMIN 4.7 4.6 4.3 4.1  ?AST 46* 51* 45* 81*  ?ALT 53*  50* 42 93*  ?ALKPHOS 52 43 42 46  ?BILITOT 1.2 1.1 0.3 0.7  ?BILIDIR  --   --  <0.1 0.1  ?IBILI  --   --  NOT CALCULATED 0.6  ? ? ? ?Hepatitis B surface antigen negative, hepatitis C RNA nondetectable. ? ?01/13/2108 US abdomen showed upper normal limits spleen size and increased echogenicity of liver questionable fatty liver. ? ?ASSESSMENT & PLAN:  ?1. Hemochromatosis carrier   ?2. Transaminitis   ? ?#Heterozygous H63D hemochromatosis/abnormal iron labs/transaminitis ?Labs reviewed and discussed with patient. ?Ferritin is 49, normal iron saturation.  TIBC is elevated.   I will hold off phlebotomy at this point. ? ? ?#Transaminitis, levels are getting worse.  Patient has fatty liver disease.  Life modification has been discussed with patient. ?Avoid alcohol.  I will obtain ultrasound right upper quadrant/liver for further evaluation.  Encourage patient to discuss with primary care provider for fatty liver disease management. ? ?All questions were answered. The patient knows to call the clinic with any problems questions or concerns. ? ?Orders Placed This Encounter  ?Procedures  ? US Abdomen Limited RUQ (LIVER/GB)  ?  Standing Status:   Future  ?  Standing Expiration Date:   01/18/2023  ?  Order Specific Question:   Reason for Exam (SYMPTOM  OR DIAGNOSIS REQUIRED)  ?  Answer:   Transaminitis  ?  Order Specific Question:   Preferred imaging location?  ?  Answer:   [facility]  ? CBC with Differential/Platelet  ?  Standing Status:   Future  ?  Standing Expiration Date:   01/18/2023  ? Comprehensive metabolic panel  ?  Standing Status:   Future  ?  Standing Expiration Date:   01/17/2023  ? Iron and  TIBC  ?  Standing Status:   Future  ?  Standing Expiration Date:   01/18/2023  ? Ferritin  ?  Standing Status:   Future  ?  Standing Expiration Date:   01/18/2023  ? ?Follow-up in 6 months. ? ?Earlie Server, MD, PhD ?

## 2022-01-17 NOTE — Progress Notes (Signed)
No phlebotomy required today per MD. ?

## 2022-01-17 NOTE — Telephone Encounter (Signed)
Left vm to confirm 01/21/22 appointment-Toni ?

## 2022-01-20 ENCOUNTER — Ambulatory Visit: Payer: BC Managed Care – PPO

## 2022-01-21 ENCOUNTER — Ambulatory Visit (INDEPENDENT_AMBULATORY_CARE_PROVIDER_SITE_OTHER): Payer: BC Managed Care – PPO | Admitting: Physician Assistant

## 2022-01-21 ENCOUNTER — Encounter: Payer: Self-pay | Admitting: Physician Assistant

## 2022-01-21 DIAGNOSIS — G43009 Migraine without aura, not intractable, without status migrainosus: Secondary | ICD-10-CM

## 2022-01-21 DIAGNOSIS — Z124 Encounter for screening for malignant neoplasm of cervix: Secondary | ICD-10-CM | POA: Diagnosis not present

## 2022-01-21 DIAGNOSIS — R300 Dysuria: Secondary | ICD-10-CM

## 2022-01-21 DIAGNOSIS — Z0001 Encounter for general adult medical examination with abnormal findings: Secondary | ICD-10-CM

## 2022-01-21 DIAGNOSIS — Z01419 Encounter for gynecological examination (general) (routine) without abnormal findings: Secondary | ICD-10-CM

## 2022-01-21 DIAGNOSIS — Z113 Encounter for screening for infections with a predominantly sexual mode of transmission: Secondary | ICD-10-CM

## 2022-01-21 DIAGNOSIS — I10 Essential (primary) hypertension: Secondary | ICD-10-CM | POA: Diagnosis not present

## 2022-01-21 DIAGNOSIS — Z1231 Encounter for screening mammogram for malignant neoplasm of breast: Secondary | ICD-10-CM

## 2022-01-21 DIAGNOSIS — N926 Irregular menstruation, unspecified: Secondary | ICD-10-CM

## 2022-01-21 DIAGNOSIS — E669 Obesity, unspecified: Secondary | ICD-10-CM

## 2022-01-21 MED ORDER — BUPROPION HCL ER (XL) 150 MG PO TB24: 150.0000 mg | ORAL_TABLET | Freq: Every day | ORAL | 2 refills | Status: DC

## 2022-01-21 NOTE — Progress Notes (Signed)
?[facility] ?[address] ?[place], [place] [address] ? ?Internal MEDICINE  ?Office Visit Note ? ?Patient Name: Jennifer Barron ? [id number]  ?[id number] ? ?Date of Service: 02/02/2022 ? ?Chief Complaint  ?Patient presents with  ? Annual Exam  ?  Discuss hormones   ? Weight Loss  ? ? ? ?HPI ?Pt is here for routine health maintenance examination ?-reports previous abnormal pap and had poor experience with OBGYN who did a colposcopy (physicians in [place]). Has not been back to them since that experience and would like to repeat pap in office today. ?-Still needs to get labs done ?-Did see hematology for her hemochromatosis, has [place] of liver upcoming, mid april ?-Stopped topamax due to some mood changes and was sleeping hard after it, and some tingling in fingers. Not many headaches.  ?-Frustrated with weight, would like to try alternative that could help weight. Metabolic test performed in past ?-previously on progesterone, every month for 4 months and then 3 off. Has not done this in a long time though. Has always had abnormal cycles. Would like to check hormones with her other labs. ?-due for mammogram ? ?Current Medication: ?Outpatient Encounter Medications as of 01/21/2022  ?Medication Sig  ? buPROPion (WELLBUTRIN XL) 150 MG 24 hr tablet Take 1 tablet (150 mg total) by mouth daily.  ? cholecalciferol (VITAMIN D) 1000 units tablet Take 1 tablet (1,000 Units total) by mouth daily.  ? spironolactone (ALDACTONE) 50 MG tablet   ? ?No facility-administered encounter medications on file as of 01/21/2022.  ? ? ?Surgical History: ?Past Surgical History:  ?Procedure Laterality Date  ? abdominal laproscopy    ? ADENOIDECTOMY    ? twice  ? TONSILLECTOMY Bilateral 1987  ? [place] EXTRACTION  2009  ? ? ?Medical History: ?Past Medical History:  ?Diagnosis Date  ? Endometriosis   ? Migraine   ? ? ?Family History: ?Family History  ?Problem Relation Age of Onset  ? Ovarian cancer Paternal Grandmother   ? Cancer  Paternal Grandmother   ? Prostate cancer Paternal Grandfather   ? Cancer Paternal Grandfather   ? Cancer Father   ? ? ? ? ?Review of Systems  ?Constitutional:  Negative for chills, fatigue and unexpected weight change.  ?HENT:  Negative for congestion, postnasal drip, rhinorrhea, sneezing and sore throat.   ?Eyes:  Negative for redness.  ?Respiratory:  Negative for cough, chest tightness and shortness of breath.   ?Cardiovascular:  Negative for chest pain and palpitations.  ?Gastrointestinal:  Negative for abdominal pain, constipation, diarrhea, nausea and vomiting.  ?Genitourinary:  Negative for dysuria and frequency.  ?Musculoskeletal:  Negative for arthralgias, back pain, joint swelling and neck pain.  ?Skin:  Negative for rash.  ?Neurological: Negative.  Negative for tremors and numbness.  ?Hematological:  Negative for adenopathy. Does not bruise/bleed easily.  ?Psychiatric/Behavioral:  Negative for behavioral problems (Depression), sleep disturbance and suicidal ideas. The patient is not nervous/anxious.   ? ? ?Vital Signs: ?BP (!) 128/94   Pulse 82   Temp 98.1 ?F (36.7 ?C)   Resp 16   Ht 5' 3"  (1.6 m)   Wt 186 lb 12.8 oz (84.7 kg)   SpO2 97%   BMI 33.09 kg/m?  ? ? ?Physical Exam ?Vitals and nursing note reviewed. Exam conducted with a chaperone present.  ?Constitutional:   ?   Appearance: Normal appearance.  ?HENT:  ?   Head: Normocephalic and atraumatic.  ?   Nose: Nose normal.  ?   Mouth/Throat:  ?  Mouth: Mucous membranes are moist.  ?   Pharynx: No posterior oropharyngeal erythema.  ?Eyes:  ?   Extraocular Movements: Extraocular movements intact.  ?   Pupils: Pupils are equal, round, and reactive to light.  ?Cardiovascular:  ?   Rate and Rhythm: Normal rate and regular rhythm.  ?   Pulses: Normal pulses.  ?   Heart sounds: Normal heart sounds.  ?Pulmonary:  ?   Effort: Pulmonary effort is normal.  ?   Breath sounds: Normal breath sounds.  ?Chest:  ?   Chest wall: No tenderness.  ?Breasts: ?    Right: No mass.  ?   Left: No mass.  ?Abdominal:  ?   Tenderness: There is no abdominal tenderness.  ?Genitourinary: ?   General: Normal vulva.  ?   Exam position: Lithotomy position.  ?   Vagina: Vaginal discharge present.  ?   Comments: Pap performed ?Musculoskeletal:     ?   General: Normal range of motion.  ?   Cervical back: Normal range of motion.  ?Skin: ?   General: Skin is warm and dry.  ?Neurological:  ?   General: No focal deficit present.  ?   Mental Status: She is alert.  ?Psychiatric:     ?   Mood and Affect: Mood normal.     ?   Behavior: Behavior normal.  ? ? ? ?LABS: ?Recent Results (from the past 2160 hour(s))  ?Iron and TIBC([facility])     Status: Abnormal  ? Collection Time: 01/17/22  8:06 AM  ?Result Value Ref Range  ? Iron 127 28 - 170 ug/dL  ? TIBC 461 (H) 250 - 450 ug/dL  ? Saturation Ratios 28 10.4 - 31.8 %  ? UIBC 334 ug/dL  ?  Comment: Performed at [facility], [address] [place], [place] [address]  ?Hepatic function panel     Status: Abnormal  ? Collection Time: 01/17/22  8:06 AM  ?Result Value Ref Range  ? Total Protein 6.9 6.5 - 8.1 g/dL  ? Albumin 4.1 3.5 - 5.0 g/dL  ? AST 81 (H) 15 - 41 U/L  ? ALT 93 (H) 0 - 44 U/L  ? Alkaline Phosphatase 46 38 - 126 U/L  ? Total Bilirubin 0.7 0.3 - 1.2 mg/dL  ? Bilirubin, Direct 0.1 0.0 - 0.2 mg/dL  ? Indirect Bilirubin 0.6 0.3 - 0.9 mg/dL  ?  Comment: Performed at [facility], [address], [place], [place] [address]  ?Ferritin     Status: None  ? Collection Time: 01/17/22  8:06 AM  ?Result Value Ref Range  ? Ferritin 49 11 - 307 ng/mL  ?  Comment: Performed at [facility], [address], [place], [place] [address]  ?CBC with Differential/Platelet     Status: Abnormal  ? Collection Time: 01/17/22  8:06 AM  ?Result Value Ref Range  ? WBC 5.6 4.0 - 10.5 K/uL  ? RBC 4.37 3.87 - 5.11 MIL/uL  ? Hemoglobin 15.2 (H) 12.0 - 15.0 g/dL  ? HCT 42.7 36.0 - 46.0 %  ? MCV 97.7 80.0 - 100.0 fL  ? MCH 34.8 (H) 26.0 -  34.0 pg  ? MCHC 35.6 30.0 - 36.0 g/dL  ? RDW 11.4 (L) 11.5 - 15.5 %  ? Platelets 262 150 - 400 K/uL  ? nRBC 0.0 0.0 - 0.2 %  ? Neutrophils Relative % 62 %  ? Neutro Abs 3.5 1.7 - 7.7 K/uL  ? Lymphocytes Relative 24 %  ?  Lymphs Abs 1.4 0.7 - 4.0 K/uL  ? Monocytes Relative 10 %  ? Monocytes Absolute 0.6 0.1 - 1.0 K/uL  ? Eosinophils Relative 3 %  ? Eosinophils Absolute 0.1 0.0 - 0.5 K/uL  ? Basophils Relative 1 %  ? Basophils Absolute 0.0 0.0 - 0.1 K/uL  ? Immature Granulocytes 0 %  ? Abs Immature Granulocytes 0.02 0.00 - 0.07 K/uL  ?  Comment: Performed at [facility], [address], [place], [place] [address]  ?IGP, Aptima HPV     Status: None  ? Collection Time: 01/21/22  9:08 AM  ?Result Value Ref Range  ? Interpretation NILM   ?  Comment: NEGATIVE FOR INTRAEPITHELIAL LESION OR MALIGNANCY.  ? Category NIL   ?  Comment: Negative for Intraepithelial Lesion  ? Adequacy SECNI   ?  Comment: Satisfactory for evaluation. No endocervical component is identified.  ? Clinician Provided ICD10 Comment   ?  Comment: Z12.4  ? Performed by: Comment   ?  Comment: Laurine Blazer, Cytotechnologist (ASCP)  ? Note: Comment   ?  Comment: The Pap smear is a screening test designed to aid in the detection of ?premalignant and malignant conditions of the uterine cervix.  It is not a ?diagnostic procedure and should not be used as the sole means of detecting ?cervical cancer.  Both false-positive and false-negative reports do occur. ?  ? Test Methodology Comment   ?  Comment: This liquid based ThinPrep(R) pap test was screened with the ?use of an image guided system. ?  ? HPV Aptima Negative Negative  ?  Comment: This nucleic acid amplification test detects fourteen high-risk ?HPV types (16,18,31,33,35,39,45,51,52,56,58,59,66,68) without ?differentiation. ?  ?UA/M w/rflx Culture, Routine     Status: Abnormal  ? Collection Time: 01/21/22  2:23 PM  ? Specimen: Urine  ? Urine  ?Result Value Ref Range  ? Specific Gravity, UA       <=1.005 (A) 1.005 - 1.030  ? pH, UA 7.0 5.0 - 7.5  ? Color, UA Yellow Yellow  ? Appearance Ur Clear Clear  ? Leukocytes,UA Negative Negative  ? Protein,UA Negative Negative/Trace  ? Glucose, UA Negative Negative  ? K

## 2022-01-22 LAB — UA/M W/RFLX CULTURE, ROUTINE
Bilirubin, UA: NEGATIVE
Glucose, UA: NEGATIVE
Ketones, UA: NEGATIVE
Leukocytes,UA: NEGATIVE
Nitrite, UA: NEGATIVE
Protein,UA: NEGATIVE
RBC, UA: NEGATIVE
Specific Gravity, UA: <=1.005 — AB (ref 1.005–1.030)
Urobilinogen, Ur: 0.2 mg/dL (ref 0.2–1.0)
pH, UA: 7 (ref 5.0–7.5)

## 2022-01-22 LAB — MICROSCOPIC EXAMINATION
Bacteria, UA: NONE SEEN
Casts: NONE SEEN /lpf
Epithelial Cells (non renal): NONE SEEN /hpf (ref 0–10)
RBC, Urine: NONE SEEN /hpf (ref 0–2)
WBC, UA: NONE SEEN /hpf (ref 0–5)

## 2022-01-23 LAB — NUSWAB VAGINITIS PLUS (VG+)
Candida albicans, NAA: POSITIVE — AB
Candida glabrata, NAA: NEGATIVE
Chlamydia trachomatis, NAA: NEGATIVE
Neisseria gonorrhoeae, NAA: NEGATIVE
Trich vag by NAA: NEGATIVE

## 2022-01-24 ENCOUNTER — Ambulatory Visit (INDEPENDENT_AMBULATORY_CARE_PROVIDER_SITE_OTHER): Payer: BC Managed Care – PPO

## 2022-01-24 DIAGNOSIS — E538 Deficiency of other specified B group vitamins: Secondary | ICD-10-CM | POA: Diagnosis not present

## 2022-01-24 MED ORDER — CYANOCOBALAMIN 1000 MCG/ML IJ SOLN
1000.0000 ug | Freq: Once | INTRAMUSCULAR | Status: AC
  Administered 2022-01-24: 1000 ug via INTRAMUSCULAR

## 2022-01-27 LAB — IGP, APTIMA HPV: HPV Aptima: NEGATIVE

## 2022-02-01 ENCOUNTER — Other Ambulatory Visit: Payer: Self-pay | Admitting: Physician Assistant

## 2022-02-01 DIAGNOSIS — B3731 Acute candidiasis of vulva and vagina: Secondary | ICD-10-CM

## 2022-02-01 MED ORDER — FLUCONAZOLE 150 MG PO TABS: 150.0000 mg | ORAL_TABLET | Freq: Once | ORAL | 0 refills | Status: AC

## 2022-02-02 ENCOUNTER — Telehealth: Payer: Self-pay

## 2022-02-02 NOTE — Telephone Encounter (Signed)
Spoke with patient regarding papsmear results on 02/01/2022. ?

## 2022-02-02 NOTE — Telephone Encounter (Signed)
-----   Message from Mylinda Latina, PA-C sent at 02/01/2022 12:54 PM EDT ----- ?Please let her know that her pap was normal. She did have some yeast present and I will send a tab of diflucan for her ?

## 2022-02-08 ENCOUNTER — Ambulatory Visit: Payer: BC Managed Care – PPO

## 2022-02-10 ENCOUNTER — Other Ambulatory Visit: Payer: Self-pay | Admitting: Physician Assistant

## 2022-02-10 DIAGNOSIS — Z1231 Encounter for screening mammogram for malignant neoplasm of breast: Secondary | ICD-10-CM

## 2022-02-18 ENCOUNTER — Ambulatory Visit: Payer: BC Managed Care – PPO | Admitting: Physician Assistant

## 2022-02-18 ENCOUNTER — Encounter: Payer: Self-pay | Admitting: Physician Assistant

## 2022-02-18 DIAGNOSIS — I10 Essential (primary) hypertension: Secondary | ICD-10-CM | POA: Diagnosis not present

## 2022-02-18 DIAGNOSIS — E66811 Obesity, class 1: Secondary | ICD-10-CM

## 2022-02-18 DIAGNOSIS — E669 Obesity, unspecified: Secondary | ICD-10-CM

## 2022-02-18 DIAGNOSIS — E538 Deficiency of other specified B group vitamins: Secondary | ICD-10-CM | POA: Diagnosis not present

## 2022-02-18 DIAGNOSIS — G43009 Migraine without aura, not intractable, without status migrainosus: Secondary | ICD-10-CM | POA: Diagnosis not present

## 2022-02-18 MED ORDER — CYANOCOBALAMIN 1000 MCG/ML IJ SOLN
1000.0000 ug | Freq: Once | INTRAMUSCULAR | Status: AC
  Administered 2022-02-18: 1000 ug via INTRAMUSCULAR

## 2022-02-18 MED ORDER — TOPIRAMATE 25 MG PO TABS: 25.0000 mg | ORAL_TABLET | Freq: Every day | ORAL | 2 refills | Status: DC

## 2022-02-18 NOTE — Progress Notes (Signed)
[facility] ?[address] ?[place], [place] [address] ? ?Internal MEDICINE  ?Office Visit Note ? ?Patient Name: Jennifer Barron ? [id number]  ?[id number] ? ?Date of Service: 02/18/2022 ? ?Chief Complaint  ?Patient presents with  ? Follow-up  ? ? ?HPI ?Pt is here for routine follow up ?-Is tolerating wellbutrin, but hasn't seen significant weight change ?-Down 1 lb since last visit ?-Has been lifting weights 3 times per week and working on exercise ?-some headaches again but better the past few days, did have a cycle since last visit and she is irregular so the headaches may have been due to this ?-Was checking bp and not very elevated during headaches so does not think they were related to BP ?-previously on topiramate but started having side effects on higher dose and is willing to retry lower dose. Had tingling in fingers and some mood changes on higher dose ?-will have labs done  ? ?Current Medication: ?Outpatient Encounter Medications as of 02/18/2022  ?Medication Sig  ? buPROPion (WELLBUTRIN XL) 150 MG 24 hr tablet Take 1 tablet (150 mg total) by mouth daily.  ? cholecalciferol (VITAMIN D) 1000 units tablet Take 1 tablet (1,000 Units total) by mouth daily.  ? spironolactone (ALDACTONE) 50 MG tablet   ? topiramate (TOPAMAX) 25 MG tablet Take 1 tablet (25 mg total) by mouth daily.  ? [EXPIRED] cyanocobalamin ((VITAMIN B-12)) injection 1,000 mcg   ? ?No facility-administered encounter medications on file as of 02/18/2022.  ? ? ?Surgical History: ?Past Surgical History:  ?Procedure Laterality Date  ? abdominal laproscopy    ? ADENOIDECTOMY    ? twice  ? TONSILLECTOMY Bilateral 1987  ? [place] EXTRACTION  2009  ? ? ?Medical History: ?Past Medical History:  ?Diagnosis Date  ? Endometriosis   ? Migraine   ? ? ?Family History: ?Family History  ?Problem Relation Age of Onset  ? Ovarian cancer Paternal Grandmother   ? Cancer Paternal Grandmother   ? Prostate cancer Paternal Grandfather   ? Cancer Paternal  Grandfather   ? Cancer Father   ? ? ?Social History  ? ?Socioeconomic History  ? Marital status: Married  ?  Spouse name: Not on file  ? Number of children: Not on file  ? Years of education: Not on file  ? Highest education level: Not on file  ?Occupational History  ? Not on file  ?Tobacco Use  ? Smoking status: Never  ? Smokeless tobacco: Never  ?Vaping Use  ? Vaping Use: Never used  ?Substance and Sexual Activity  ? Alcohol use: Yes  ?  Comment: social  ? Drug use: No  ? Sexual activity: Yes  ?Other Topics Concern  ? Not on file  ?Social History Narrative  ? Not on file  ? ?Social Determinants of Health  ? ?Financial Resource Strain: Not on file  ?Food Insecurity: Not on file  ?Transportation Needs: Not on file  ?Physical Activity: Not on file  ?Stress: Not on file  ?Social Connections: Not on file  ?Intimate Partner Violence: Not on file  ? ? ? ? ?Review of Systems  ?Constitutional:  Negative for chills, fatigue and unexpected weight change.  ?HENT:  Negative for congestion, postnasal drip, rhinorrhea, sneezing and sore throat.   ?Eyes:  Negative for redness.  ?Respiratory:  Negative for cough, chest tightness and shortness of breath.   ?Cardiovascular:  Negative for chest pain and palpitations.  ?Gastrointestinal:  Negative for abdominal pain, constipation, diarrhea, nausea and vomiting.  ?Genitourinary:  Negative for dysuria and frequency.  ?Musculoskeletal:  Negative for arthralgias, back pain, joint swelling and neck pain.  ?Skin:  Negative for rash.  ?Neurological: Negative.  Negative for tremors and numbness.  ?Hematological:  Negative for adenopathy. Does not bruise/bleed easily.  ?Psychiatric/Behavioral:  Negative for behavioral problems (Depression), sleep disturbance and suicidal ideas. The patient is not nervous/anxious.   ? ?Vital Signs: ?BP 125/89   Pulse 60   Temp 98.2 ?F (36.8 ?C)   Resp 16   Ht 5' 3"  (1.6 m)   Wt 185 lb 12.8 oz (84.3 kg)   SpO2 99%   BMI 32.91 kg/m?  ? ? ?Physical  Exam ?Vitals and nursing note reviewed.  ?Constitutional:   ?   Appearance: Normal appearance.  ?HENT:  ?   Head: Normocephalic and atraumatic.  ?   Nose: Nose normal.  ?   Mouth/Throat:  ?   Mouth: Mucous membranes are moist.  ?   Pharynx: No posterior oropharyngeal erythema.  ?Eyes:  ?   Extraocular Movements: Extraocular movements intact.  ?   Pupils: Pupils are equal, round, and reactive to light.  ?Cardiovascular:  ?   Pulses: Normal pulses.  ?   Heart sounds: Normal heart sounds.  ?Pulmonary:  ?   Effort: Pulmonary effort is normal.  ?   Breath sounds: Normal breath sounds.  ?Abdominal:  ?   General: Abdomen is flat.  ?Musculoskeletal:     ?   General: Normal range of motion.  ?   Cervical back: Normal range of motion.  ?Skin: ?   General: Skin is warm and dry.  ?Neurological:  ?   General: No focal deficit present.  ?   Mental Status: She is alert.  ?Psychiatric:     ?   Mood and Affect: Mood normal.     ?   Behavior: Behavior normal.     ?   Thought Content: Thought content normal.     ?   Judgment: Judgment normal.  ? ? ? ? ? ?Assessment/Plan: ?1. Migraine without aura and without status migrainosus, not intractable ?Will restart topamax at low dose to help with migraines and weight loss. If side effects begin again even at low dose then she will discontinue and consider alternative ?- topiramate (TOPAMAX) 25 MG tablet; Take 1 tablet (25 mg total) by mouth daily.  Dispense: 30 tablet; Refill: 2 ? ?2. Benign hypertension ?Stable, continue current medication ? ?3. B12 deficiency ?- cyanocobalamin ((VITAMIN B-12)) injection 1,000 mcg ? ?4. Obesity (BMI 30.0-34.9) ?Working on diet and exercise and will continue wellbutrin and restart low dose topamax for migraines and wt loss. ? ? ?General Counseling: Lavana verbalizes understanding of the findings of todays visit and agrees with plan of treatment. I have discussed any further diagnostic evaluation that may be needed or ordered today. We also reviewed her  medications today. she has been encouraged to call the office with any questions or concerns that should arise related to todays visit. ? ? ? ?No orders of the defined types were placed in this encounter. ? ? ?Meds ordered this encounter  ?Medications  ? cyanocobalamin ((VITAMIN B-12)) injection 1,000 mcg  ? topiramate (TOPAMAX) 25 MG tablet  ?  Sig: Take 1 tablet (25 mg total) by mouth daily.  ?  Dispense:  30 tablet  ?  Refill:  2  ? ? ?This patient was seen by Drema Dallas, PA-C in collaboration with Dr. Clayborn Bigness as a part of collaborative care agreement. ? ? ?  Total time spent:30 Minutes ?Time spent includes review of chart, medications, test results, and follow up plan with the patient.  ? ? ? ? ?Dr Lavera Guise ?Internal medicine  ?

## 2022-02-21 ENCOUNTER — Ambulatory Visit: Payer: BC Managed Care – PPO

## 2022-03-02 ENCOUNTER — Ambulatory Visit
Admission: RE | Admit: 2022-03-02 | Discharge: 2022-03-02 | Disposition: A | Discharge status: Home / Self Care | Payer: BC Managed Care – PPO | Source: Ambulatory Visit | Attending: Physician Assistant | Admitting: Physician Assistant

## 2022-03-02 DIAGNOSIS — Z1231 Encounter for screening mammogram for malignant neoplasm of breast: Secondary | ICD-10-CM | POA: Diagnosis not present

## 2022-03-05 ENCOUNTER — Telehealth: Payer: BC Managed Care – PPO | Admitting: Nurse Practitioner

## 2022-03-05 DIAGNOSIS — B001 Herpesviral vesicular dermatitis: Secondary | ICD-10-CM

## 2022-03-05 MED ORDER — VALACYCLOVIR HCL 1 G PO TABS: 2000.0000 mg | ORAL_TABLET | Freq: Two times a day (BID) | ORAL | 0 refills | Status: AC

## 2022-03-05 NOTE — Progress Notes (Signed)

## 2022-03-05 NOTE — Progress Notes (Signed)
I have spent 5 minutes in review of e-visit questionnaire, review and updating patient chart, medical decision making and response to patient.  ° °Coretta Leisey W Selia Wareing, NP ° °  °

## 2022-03-17 DIAGNOSIS — D2261 Melanocytic nevi of right upper limb, including shoulder: Secondary | ICD-10-CM | POA: Diagnosis not present

## 2022-03-17 DIAGNOSIS — D225 Melanocytic nevi of trunk: Secondary | ICD-10-CM | POA: Diagnosis not present

## 2022-03-17 DIAGNOSIS — D2272 Melanocytic nevi of left lower limb, including hip: Secondary | ICD-10-CM | POA: Diagnosis not present

## 2022-03-17 DIAGNOSIS — D2262 Melanocytic nevi of left upper limb, including shoulder: Secondary | ICD-10-CM | POA: Diagnosis not present

## 2022-03-28 ENCOUNTER — Ambulatory Visit: Payer: BC Managed Care – PPO

## 2022-03-29 ENCOUNTER — Other Ambulatory Visit: Payer: Self-pay | Admitting: Physician Assistant

## 2022-03-29 DIAGNOSIS — E669 Obesity, unspecified: Secondary | ICD-10-CM

## 2022-03-31 ENCOUNTER — Ambulatory Visit: Payer: BC Managed Care – PPO | Admitting: Physician Assistant

## 2022-04-25 ENCOUNTER — Ambulatory Visit (INDEPENDENT_AMBULATORY_CARE_PROVIDER_SITE_OTHER): Payer: BC Managed Care – PPO

## 2022-04-25 DIAGNOSIS — E538 Deficiency of other specified B group vitamins: Secondary | ICD-10-CM | POA: Diagnosis not present

## 2022-04-25 MED ORDER — CYANOCOBALAMIN 1000 MCG/ML IJ SOLN
1000.0000 ug | Freq: Once | INTRAMUSCULAR | Status: AC
  Administered 2022-04-25: 1000 ug via INTRAMUSCULAR

## 2022-05-26 ENCOUNTER — Ambulatory Visit (INDEPENDENT_AMBULATORY_CARE_PROVIDER_SITE_OTHER): Payer: BC Managed Care – PPO

## 2022-05-26 DIAGNOSIS — E538 Deficiency of other specified B group vitamins: Secondary | ICD-10-CM

## 2022-05-26 MED ORDER — CYANOCOBALAMIN 1000 MCG/ML IJ SOLN
1000.0000 ug | Freq: Once | INTRAMUSCULAR | Status: AC
  Administered 2022-05-26: 1000 ug via INTRAMUSCULAR

## 2022-06-02 ENCOUNTER — Other Ambulatory Visit: Payer: Self-pay | Admitting: Internal Medicine

## 2022-06-02 DIAGNOSIS — I10 Essential (primary) hypertension: Secondary | ICD-10-CM

## 2022-06-23 ENCOUNTER — Ambulatory Visit (INDEPENDENT_AMBULATORY_CARE_PROVIDER_SITE_OTHER): Payer: BC Managed Care – PPO

## 2022-06-23 DIAGNOSIS — E538 Deficiency of other specified B group vitamins: Secondary | ICD-10-CM | POA: Diagnosis not present

## 2022-06-23 MED ORDER — CYANOCOBALAMIN 1000 MCG/ML IJ SOLN
1000.0000 ug | Freq: Once | INTRAMUSCULAR | Status: AC
  Administered 2022-06-23: 1000 ug via INTRAMUSCULAR

## 2022-07-04 ENCOUNTER — Telehealth: Payer: BC Managed Care – PPO | Admitting: Physician Assistant

## 2022-07-04 DIAGNOSIS — R3989 Other symptoms and signs involving the genitourinary system: Secondary | ICD-10-CM | POA: Diagnosis not present

## 2022-07-04 MED ORDER — CEPHALEXIN 500 MG PO CAPS: 500.0000 mg | ORAL_CAPSULE | Freq: Two times a day (BID) | ORAL | 0 refills | Status: DC

## 2022-07-04 NOTE — Progress Notes (Signed)

## 2022-07-07 ENCOUNTER — Ambulatory Visit (INDEPENDENT_AMBULATORY_CARE_PROVIDER_SITE_OTHER): Payer: BC Managed Care – PPO | Admitting: Physician Assistant

## 2022-07-07 ENCOUNTER — Encounter: Payer: Self-pay | Admitting: Physician Assistant

## 2022-07-07 VITALS — BP 119/85 | HR 75 | Temp 97.9°F | Resp 16 | Ht 63.0 in | Wt 198.4 lb

## 2022-07-07 DIAGNOSIS — G43009 Migraine without aura, not intractable, without status migrainosus: Secondary | ICD-10-CM | POA: Diagnosis not present

## 2022-07-07 DIAGNOSIS — E785 Hyperlipidemia, unspecified: Secondary | ICD-10-CM

## 2022-07-07 DIAGNOSIS — N926 Irregular menstruation, unspecified: Secondary | ICD-10-CM

## 2022-07-07 DIAGNOSIS — E538 Deficiency of other specified B group vitamins: Secondary | ICD-10-CM

## 2022-07-07 DIAGNOSIS — E559 Vitamin D deficiency, unspecified: Secondary | ICD-10-CM | POA: Diagnosis not present

## 2022-07-07 DIAGNOSIS — I10 Essential (primary) hypertension: Secondary | ICD-10-CM

## 2022-07-07 DIAGNOSIS — R946 Abnormal results of thyroid function studies: Secondary | ICD-10-CM

## 2022-07-07 DIAGNOSIS — R635 Abnormal weight gain: Secondary | ICD-10-CM | POA: Diagnosis not present

## 2022-07-07 DIAGNOSIS — R5383 Other fatigue: Secondary | ICD-10-CM | POA: Diagnosis not present

## 2022-07-07 DIAGNOSIS — E669 Obesity, unspecified: Secondary | ICD-10-CM

## 2022-07-07 MED ORDER — FLUCONAZOLE 150 MG PO TABS: 150.0000 mg | ORAL_TABLET | Freq: Once | ORAL | 0 refills | Status: AC

## 2022-07-07 NOTE — Progress Notes (Unsigned)
[facility] [place], [place] [address]  Internal MEDICINE  Office Visit Note  Patient Name: Jennifer Barron  [id number]  [id number]  Date of Service: 07/12/2022  Chief Complaint  Patient presents with   Follow-up   Quality Metric Gaps    Tetanus Vaccine    HPI Pt is here for routine follow up -She does mention that her mom has breast cancer, she personally did have her mammogram this year and was negative -She did have UTI on evisit and is on ABX currently -Migraines have been ok, seem to still be hormonal. Does have long cycles -Spironolactone from dermatology -Does see hematology soon for follow up -Exercising 5 days per week, with weight training 3 days per week and continues to monitor diet, but weight keeps rising. Up 13lbs since last visit -Topamax causing side effects again, even at lower dose so has stopped this. She also doesn't find that wellbutrin has helped any with weight and mood has been fine -Took phentermine in the past, but then gained weight back after stopping -Will check labs, including an A1c  Current Medication: Outpatient Encounter Medications as of 07/07/2022  Medication Sig   cephALEXin (KEFLEX) 500 MG capsule Take 1 capsule (500 mg total) by mouth 2 (two) times daily.   cholecalciferol (VITAMIN D) 1000 units tablet Take 1 tablet (1,000 Units total) by mouth daily.   [EXPIRED] fluconazole (DIFLUCAN) 150 MG tablet Take 1 tablet (150 mg total) by mouth once for 1 dose.   spironolactone (ALDACTONE) 50 MG tablet    [DISCONTINUED] buPROPion (WELLBUTRIN XL) 150 MG 24 hr tablet TAKE 1 TABLET BY MOUTH DAILY.   [DISCONTINUED] topiramate (TOPAMAX) 25 MG tablet Take 1 tablet (25 mg total) by mouth daily.   No facility-administered encounter medications on file as of 07/07/2022.    Surgical History: Past Surgical History:  Procedure Laterality Date   abdominal laproscopy     ADENOIDECTOMY     twice   TONSILLECTOMY Bilateral 1987    WISDOM TOOTH EXTRACTION  2009    Medical History: Past Medical History:  Diagnosis Date   Endometriosis    Migraine     Family History: Family History  Problem Relation Age of Onset   Breast cancer Mother    Cancer Father    Ovarian cancer Paternal Grandmother    Cancer Paternal Grandmother    Prostate cancer Paternal Grandfather    Cancer Paternal Grandfather     Social History   Socioeconomic History   Marital status: Married    Spouse name: Not on file   Number of children: Not on file   Years of education: Not on file   Highest education level: Not on file  Occupational History   Not on file  Tobacco Use   Smoking status: Never   Smokeless tobacco: Never  Vaping Use   Vaping Use: Never used  Substance and Sexual Activity   Alcohol use: Yes    Comment: social   Drug use: No   Sexual activity: Yes  Other Topics Concern   Not on file  Social History Narrative   Not on file   Social Determinants of Health   Financial Resource Strain: Not on file  Food Insecurity: Not on file  Transportation Needs: Not on file  Physical Activity: Not on file  Stress: Not on file  Social Connections: Not on file  Intimate Partner Violence: Not on file      Review of Systems  Constitutional:  Positive  for unexpected weight change. Negative for chills and fatigue.  HENT:  Negative for congestion, postnasal drip, rhinorrhea, sneezing and sore throat.   Eyes:  Negative for redness.  Respiratory:  Negative for cough, chest tightness and shortness of breath.   Cardiovascular:  Negative for chest pain and palpitations.  Gastrointestinal:  Negative for abdominal pain, constipation, diarrhea, nausea and vomiting.  Genitourinary:  Negative for dysuria and frequency.  Musculoskeletal:  Negative for arthralgias, back pain, joint swelling and neck pain.  Skin:  Negative for rash.  Neurological: Negative.  Negative for tremors and numbness.  Hematological:  Negative for  adenopathy. Does not bruise/bleed easily.  Psychiatric/Behavioral:  Negative for behavioral problems (Depression), sleep disturbance and suicidal ideas. The patient is not nervous/anxious.     Vital Signs: BP 119/85   Pulse 75   Temp 97.9 F (36.6 C)   Resp 16   Ht 5' 3"  (1.6 m)   Wt 198 lb 6.4 oz (90 kg)   SpO2 98%   BMI 35.14 kg/m    Physical Exam Vitals and nursing note reviewed.  Constitutional:      Appearance: Normal appearance.  HENT:     Head: Normocephalic and atraumatic.     Nose: Nose normal.     Mouth/Throat:     Mouth: Mucous membranes are moist.     Pharynx: No posterior oropharyngeal erythema.  Eyes:     Extraocular Movements: Extraocular movements intact.     Pupils: Pupils are equal, round, and reactive to light.  Cardiovascular:     Pulses: Normal pulses.     Heart sounds: Normal heart sounds.  Pulmonary:     Effort: Pulmonary effort is normal.     Breath sounds: Normal breath sounds.  Abdominal:     General: Abdomen is flat.  Musculoskeletal:        General: Normal range of motion.     Cervical back: Normal range of motion.  Skin:    General: Skin is warm and dry.  Neurological:     General: No focal deficit present.     Mental Status: She is alert.  Psychiatric:        Mood and Affect: Mood normal.        Behavior: Behavior normal.        Thought Content: Thought content normal.        Judgment: Judgment normal.        Assessment/Plan: 1. Benign hypertension Stable, continue current medication  2. Migraine without aura and without status migrainosus, not intractable Unable to continue topamax, but have been ok without this and will monitor  3. Hereditary hemochromatosis ([place]) Followed by hematology  4. Other fatigue - CBC w/Diff/Platelet - Comprehensive metabolic panel - TSH + free T4 - Lipid Panel With LDL/HDL Ratio - VITAMIN D 25 Hydroxy (Vit-D Deficiency, Fractures) - B12 and Folate Panel - Estradiol - FSH/LH -  Prolactin - Hgb A1C w/o eAG  5. B12 deficiency - B12 and Folate Panel  6. Vitamin D deficiency - VITAMIN D 25 Hydroxy (Vit-D Deficiency, Fractures)  7. Hyperlipidemia, unspecified hyperlipidemia type - Lipid Panel With LDL/HDL Ratio  8. Weight gain - TSH + free T4 - Estradiol - FSH/LH - Prolactin - Hgb A1C w/o eAG  9. Abnormal thyroid exam - TSH + free T4  10. Irregular menstrual cycle - Estradiol - FSH/LH - Prolactin - Hgb A1C w/o eAG  11. Obesity (BMI 30-39.9) Unable to tolerate topamax and wellbutrin was ineffective. Phentermine effective in past,  but gained weight back once stopped. Will check A1c and other labs, including thyroid to ensure this is not contributing to weight gain. Continue to work on diet and exercise and will follow up after labs   General Counseling: Fable verbalizes understanding of the findings of todays visit and agrees with plan of treatment. I have discussed any further diagnostic evaluation that may be needed or ordered today. We also reviewed her medications today. she has been encouraged to call the office with any questions or concerns that should arise related to todays visit.    Orders Placed This Encounter  Procedures   CBC w/Diff/Platelet   Comprehensive metabolic panel   TSH + free T4   Lipid Panel With LDL/HDL Ratio   VITAMIN D 25 Hydroxy (Vit-D Deficiency, Fractures)   B12 and Folate Panel   Estradiol   FSH/LH   Prolactin   Hgb A1C w/o eAG    Meds ordered this encounter  Medications   fluconazole (DIFLUCAN) 150 MG tablet    Sig: Take 1 tablet (150 mg total) by mouth once for 1 dose.    Dispense:  1 tablet    Refill:  0    This patient was seen by Drema Dallas, PA-C in collaboration with Dr. Clayborn Bigness as a part of collaborative care agreement.   Total time spent:30 Minutes Time spent includes review of chart, medications, test results, and follow up plan with the patient.      Dr Lavera Guise Internal  medicine

## 2022-07-08 LAB — TSH+FREE T4
Free T4: 1.62 ng/dL (ref 0.82–1.77)
TSH: 1.69 u[IU]/mL (ref 0.450–4.500)

## 2022-07-08 LAB — CBC WITH DIFFERENTIAL/PLATELET
Basophils Absolute: 0 10*3/uL (ref 0.0–0.2)
Basos: 1 %
EOS (ABSOLUTE): 0.2 10*3/uL (ref 0.0–0.4)
Eos: 3 %
Hematocrit: 45 % (ref 34.0–46.6)
Hemoglobin: 15.8 g/dL (ref 11.1–15.9)
Immature Grans (Abs): 0 10*3/uL (ref 0.0–0.1)
Immature Granulocytes: 0 %
Lymphocytes Absolute: 1.6 10*3/uL (ref 0.7–3.1)
Lymphs: 27 %
MCH: 35.3 pg — ABNORMAL HIGH (ref 26.6–33.0)
MCHC: 35.1 g/dL (ref 31.5–35.7)
MCV: 101 fL — ABNORMAL HIGH (ref 79–97)
Monocytes Absolute: 0.6 10*3/uL (ref 0.1–0.9)
Monocytes: 11 %
Neutrophils Absolute: 3.5 10*3/uL (ref 1.4–7.0)
Neutrophils: 58 %
Platelets: 270 10*3/uL (ref 150–450)
RBC: 4.47 x10E6/uL (ref 3.77–5.28)
RDW: 11.4 % — ABNORMAL LOW (ref 11.7–15.4)
WBC: 5.9 10*3/uL (ref 3.4–10.8)

## 2022-07-08 LAB — COMPREHENSIVE METABOLIC PANEL
ALT: 108 IU/L — ABNORMAL HIGH (ref 0–32)
AST: 132 IU/L — ABNORMAL HIGH (ref 0–40)
Albumin/Globulin Ratio: 2 (ref 1.2–2.2)
Albumin: 4.7 g/dL (ref 3.9–4.9)
Alkaline Phosphatase: 55 IU/L (ref 44–121)
BUN/Creatinine Ratio: 13 (ref 9–23)
BUN: 10 mg/dL (ref 6–24)
Bilirubin Total: 1.3 mg/dL — ABNORMAL HIGH (ref 0.0–1.2)
CO2: 25 mmol/L (ref 20–29)
Calcium: 10 mg/dL (ref 8.7–10.2)
Chloride: 100 mmol/L (ref 96–106)
Creatinine, Ser: 0.79 mg/dL (ref 0.57–1.00)
Globulin, Total: 2.3 g/dL (ref 1.5–4.5)
Glucose: 88 mg/dL (ref 70–99)
Potassium: 4.7 mmol/L (ref 3.5–5.2)
Sodium: 138 mmol/L (ref 134–144)
Total Protein: 7 g/dL (ref 6.0–8.5)
eGFR: 97 mL/min/{1.73_m2} (ref >59)

## 2022-07-08 LAB — HGB A1C W/O EAG: Hgb A1c MFr Bld: 5 % (ref 4.8–5.6)

## 2022-07-08 LAB — ESTRADIOL: Estradiol: 64.1 pg/mL

## 2022-07-08 LAB — LIPID PANEL WITH LDL/HDL RATIO
Cholesterol, Total: 287 mg/dL — ABNORMAL HIGH (ref 100–199)
HDL: 65 mg/dL (ref >39)
LDL Chol Calc (NIH): 202 mg/dL — ABNORMAL HIGH (ref 0–99)
LDL/HDL Ratio: 3.1 ratio (ref 0.0–3.2)
Triglycerides: 115 mg/dL (ref 0–149)
VLDL Cholesterol Cal: 20 mg/dL (ref 5–40)

## 2022-07-08 LAB — FSH/LH
FSH: 6.7 m[IU]/mL
LH: 15.9 m[IU]/mL

## 2022-07-08 LAB — PROLACTIN: Prolactin: 7.4 ng/mL (ref 4.8–23.3)

## 2022-07-08 LAB — VITAMIN D 25 HYDROXY (VIT D DEFICIENCY, FRACTURES): Vit D, 25-Hydroxy: 40.7 ng/mL (ref 30.0–100.0)

## 2022-07-08 LAB — B12 AND FOLATE PANEL
Folate: 4.6 ng/mL (ref >3.0)
Vitamin B-12: 615 pg/mL (ref 232–1245)

## 2022-07-12 ENCOUNTER — Encounter: Payer: Self-pay | Admitting: Physician Assistant

## 2022-07-14 ENCOUNTER — Ambulatory Visit (INDEPENDENT_AMBULATORY_CARE_PROVIDER_SITE_OTHER): Payer: BC Managed Care – PPO | Admitting: Physician Assistant

## 2022-07-14 ENCOUNTER — Encounter: Payer: Self-pay | Admitting: Physician Assistant

## 2022-07-14 VITALS — BP 128/80 | HR 85 | Temp 97.8°F | Resp 16 | Ht 65.0 in | Wt 204.4 lb

## 2022-07-14 DIAGNOSIS — E669 Obesity, unspecified: Secondary | ICD-10-CM

## 2022-07-14 DIAGNOSIS — E785 Hyperlipidemia, unspecified: Secondary | ICD-10-CM

## 2022-07-14 DIAGNOSIS — K760 Fatty (change of) liver, not elsewhere classified: Secondary | ICD-10-CM | POA: Diagnosis not present

## 2022-07-14 MED ORDER — PHENTERMINE HCL 37.5 MG PO CAPS: 37.5000 mg | ORAL_CAPSULE | ORAL | 0 refills | Status: DC

## 2022-07-14 NOTE — Progress Notes (Signed)
[facility] [place], [place] [address]  Internal MEDICINE  Office Visit Note  Patient Name: Jennifer Barron  [id number]  [id number]  Date of Service: 07/14/2022  Chief Complaint  Patient presents with   Follow-up    labs    HPI Pt is here for follow up to review labs and discuss weight loss -Labs reviewed: Elevated LFTs and elevated cholesterol. Her b12 did appear normal, however likely falsely elevated due to recent b12 shot and will continue this supplementation. Vit D is low normal and will increase supplementation -She has appt next week with hematology. States she will have repeat labs done with them and will plan to ask about the liver/GB US they had ordered, but that was never done in the spring. She is followed for hemachromatosis and elevated LFTs with hx of fatty liver and does need this to be rechecked as her enzymes were higher on recent blood work, likely due to weight gain. Have been avoiding statins due to LFTs -Menstrual spotting just started, often gets headaches around cycle, but reports phentermine never impacted her headaches in past -will start calorie tracking again. She has a friend who does preset meal plans for calorie restriction. She states she thinks it is a 1300 meal plan and will restart this while also increasing fitness. Will start back on phentermine to help with appetite suppression and getting weight down as this will likely help liver enzymes as well  Current Medication: Outpatient Encounter Medications as of 07/14/2022  Medication Sig   cephALEXin (KEFLEX) 500 MG capsule Take 1 capsule (500 mg total) by mouth 2 (two) times daily.   cholecalciferol (VITAMIN D) 1000 units tablet Take 1 tablet (1,000 Units total) by mouth daily.   phentermine 37.5 MG capsule Take 1 capsule (37.5 mg total) by mouth every morning.   spironolactone (ALDACTONE) 50 MG tablet    No facility-administered encounter medications on file as of  07/14/2022.    Surgical History: Past Surgical History:  Procedure Laterality Date   abdominal laproscopy     ADENOIDECTOMY     twice   TONSILLECTOMY Bilateral 1987   WISDOM TOOTH EXTRACTION  2009    Medical History: Past Medical History:  Diagnosis Date   Endometriosis    Migraine     Family History: Family History  Problem Relation Age of Onset   Breast cancer Mother    Cancer Father    Ovarian cancer Paternal Grandmother    Cancer Paternal Grandmother    Prostate cancer Paternal Grandfather    Cancer Paternal Grandfather     Social History   Socioeconomic History   Marital status: Married    Spouse name: Not on file   Number of children: Not on file   Years of education: Not on file   Highest education level: Not on file  Occupational History   Not on file  Tobacco Use   Smoking status: Never   Smokeless tobacco: Never  Vaping Use   Vaping Use: Never used  Substance and Sexual Activity   Alcohol use: Yes    Comment: social   Drug use: No   Sexual activity: Yes  Other Topics Concern   Not on file  Social History Narrative   Not on file   Social Determinants of Health   Financial Resource Strain: Not on file  Food Insecurity: Not on file  Transportation Needs: Not on file  Physical Activity: Not on file  Stress: Not on file  Social  Connections: Not on file  Intimate Partner Violence: Not on file      Review of Systems  Constitutional:  Positive for unexpected weight change. Negative for chills and fatigue.  HENT:  Negative for congestion, postnasal drip, rhinorrhea, sneezing and sore throat.   Eyes:  Negative for redness.  Respiratory:  Negative for cough, chest tightness and shortness of breath.   Cardiovascular:  Negative for chest pain and palpitations.  Gastrointestinal:  Negative for abdominal pain, constipation, diarrhea, nausea and vomiting.  Genitourinary:  Negative for dysuria and frequency.  Musculoskeletal:  Negative for  arthralgias, back pain, joint swelling and neck pain.  Skin:  Negative for rash.  Neurological: Negative.  Negative for tremors and numbness.  Hematological:  Negative for adenopathy. Does not bruise/bleed easily.  Psychiatric/Behavioral:  Negative for behavioral problems (Depression), sleep disturbance and suicidal ideas. The patient is not nervous/anxious.     Vital Signs: BP 128/80   Pulse 85   Temp 97.8 F (36.6 C)   Resp 16   Ht 5' 5"  (1.651 m)   Wt 204 lb 6.4 oz (92.7 kg)   SpO2 97%   BMI 34.01 kg/m    Physical Exam Vitals and nursing note reviewed.  Constitutional:      Appearance: Normal appearance.  HENT:     Head: Normocephalic and atraumatic.     Nose: Nose normal.     Mouth/Throat:     Mouth: Mucous membranes are moist.     Pharynx: No posterior oropharyngeal erythema.  Eyes:     Extraocular Movements: Extraocular movements intact.     Pupils: Pupils are equal, round, and reactive to light.  Cardiovascular:     Pulses: Normal pulses.     Heart sounds: Normal heart sounds.  Pulmonary:     Effort: Pulmonary effort is normal.     Breath sounds: Normal breath sounds.  Abdominal:     General: Abdomen is flat.  Musculoskeletal:        General: Normal range of motion.     Cervical back: Normal range of motion.  Skin:    General: Skin is warm and dry.  Neurological:     General: No focal deficit present.     Mental Status: She is alert.  Psychiatric:        Mood and Affect: Mood normal.        Behavior: Behavior normal.        Thought Content: Thought content normal.        Judgment: Judgment normal.        Assessment/Plan: 1. Obesity (BMI 30.0-34.9) Will start on phentermine and work on diet and exercise - phentermine 37.5 MG capsule; Take 1 capsule (37.5 mg total) by mouth every morning.  Dispense: 30 capsule; Refill: 0  Obesity Counseling: Risk Assessment: An assessment of behavioral risk factors was made today and includes lack of exercise  sedentary lifestyle, lack of portion control and poor dietary habits.  Risk Modification Advice: She was counseled on portion control guidelines. Restricting daily caloric intake to 1200-1300. The detrimental long term effects of obesity on her health and ongoing poor compliance was also discussed with the patient.  2. Hereditary hemochromatosis ([place]) Has follow up with hematology next week  3. Fatty liver Will work on weight loss to help management and will discuss scheduling liver/GB US with hematology next week as previously planned  4. Hyperlipidemia, unspecified hyperlipidemia type Continue to work on diet and exercise, avoiding fried/fatty foods   General Counseling: Jennifer Barron  verbalizes understanding of the findings of todays visit and agrees with plan of treatment. I have discussed any further diagnostic evaluation that may be needed or ordered today. We also reviewed her medications today. she has been encouraged to call the office with any questions or concerns that should arise related to todays visit.    No orders of the defined types were placed in this encounter.   Meds ordered this encounter  Medications   phentermine 37.5 MG capsule    Sig: Take 1 capsule (37.5 mg total) by mouth every morning.    Dispense:  30 capsule    Refill:  0    This patient was seen by Drema Dallas, PA-C in collaboration with Dr. Clayborn Bigness as a part of collaborative care agreement.   Total time spent:30 Minutes Time spent includes review of chart, medications, test results, and follow up plan with the patient.      Dr Lavera Guise Internal medicine

## 2022-07-18 ENCOUNTER — Inpatient Hospital Stay: Payer: BC Managed Care – PPO

## 2022-07-19 ENCOUNTER — Inpatient Hospital Stay: Payer: BC Managed Care – PPO | Attending: Oncology

## 2022-07-19 DIAGNOSIS — D751 Secondary polycythemia: Secondary | ICD-10-CM | POA: Diagnosis not present

## 2022-07-19 DIAGNOSIS — R7401 Elevation of levels of liver transaminase levels: Secondary | ICD-10-CM | POA: Insufficient documentation

## 2022-07-19 DIAGNOSIS — Z148 Genetic carrier of other disease: Secondary | ICD-10-CM

## 2022-07-19 LAB — CBC WITH DIFFERENTIAL/PLATELET
Abs Immature Granulocytes: 0.02 10*3/uL (ref 0.00–0.07)
Basophils Absolute: 0 10*3/uL (ref 0.0–0.1)
Basophils Relative: 0 %
Eosinophils Absolute: 0.1 10*3/uL (ref 0.0–0.5)
Eosinophils Relative: 2 %
HCT: 44.4 % (ref 36.0–46.0)
Hemoglobin: 16.3 g/dL — ABNORMAL HIGH (ref 12.0–15.0)
Immature Granulocytes: 0 %
Lymphocytes Relative: 27 %
Lymphs Abs: 1.6 10*3/uL (ref 0.7–4.0)
MCH: 35 pg — ABNORMAL HIGH (ref 26.0–34.0)
MCHC: 36.7 g/dL — ABNORMAL HIGH (ref 30.0–36.0)
MCV: 95.3 fL (ref 80.0–100.0)
Monocytes Absolute: 0.5 10*3/uL (ref 0.1–1.0)
Monocytes Relative: 9 %
Neutro Abs: 3.6 10*3/uL (ref 1.7–7.7)
Neutrophils Relative %: 62 %
Platelets: 300 10*3/uL (ref 150–400)
RBC: 4.66 MIL/uL (ref 3.87–5.11)
RDW: 10.9 % — ABNORMAL LOW (ref 11.5–15.5)
WBC: 5.9 10*3/uL (ref 4.0–10.5)
nRBC: 0 % (ref 0.0–0.2)

## 2022-07-19 LAB — IRON AND TIBC
Iron: 287 ug/dL — ABNORMAL HIGH (ref 28–170)
Saturation Ratios: 62 % — ABNORMAL HIGH (ref 10.4–31.8)
TIBC: 462 ug/dL — ABNORMAL HIGH (ref 250–450)
UIBC: 175 ug/dL

## 2022-07-19 LAB — COMPREHENSIVE METABOLIC PANEL
ALT: 101 U/L — ABNORMAL HIGH (ref 0–44)
AST: 68 U/L — ABNORMAL HIGH (ref 15–41)
Albumin: 4.4 g/dL (ref 3.5–5.0)
Alkaline Phosphatase: 56 U/L (ref 38–126)
Anion gap: 6 (ref 5–15)
BUN: 10 mg/dL (ref 6–20)
CO2: 28 mmol/L (ref 22–32)
Calcium: 9.4 mg/dL (ref 8.9–10.3)
Chloride: 100 mmol/L (ref 98–111)
Creatinine, Ser: 0.87 mg/dL (ref 0.44–1.00)
GFR, Estimated: >60 mL/min (ref >60)
Glucose, Bld: 108 mg/dL — ABNORMAL HIGH (ref 70–99)
Potassium: 4.8 mmol/L (ref 3.5–5.1)
Sodium: 134 mmol/L — ABNORMAL LOW (ref 135–145)
Total Bilirubin: 1.2 mg/dL (ref 0.3–1.2)
Total Protein: 7.6 g/dL (ref 6.5–8.1)

## 2022-07-19 LAB — FERRITIN: Ferritin: 137 ng/mL (ref 11–307)

## 2022-07-20 ENCOUNTER — Encounter: Payer: Self-pay | Admitting: Oncology

## 2022-07-20 ENCOUNTER — Inpatient Hospital Stay (HOSPITAL_BASED_OUTPATIENT_CLINIC_OR_DEPARTMENT_OTHER): Payer: BC Managed Care – PPO | Admitting: Oncology

## 2022-07-20 ENCOUNTER — Inpatient Hospital Stay: Payer: BC Managed Care – PPO

## 2022-07-20 VITALS — BP 135/95 | HR 82 | Resp 18

## 2022-07-20 VITALS — BP 133/97 | HR 76 | Temp 99.3°F | Resp 18 | Wt 199.7 lb

## 2022-07-20 DIAGNOSIS — D751 Secondary polycythemia: Secondary | ICD-10-CM | POA: Diagnosis not present

## 2022-07-20 DIAGNOSIS — R7401 Elevation of levels of liver transaminase levels: Secondary | ICD-10-CM | POA: Diagnosis not present

## 2022-07-20 DIAGNOSIS — R790 Abnormal level of blood mineral: Secondary | ICD-10-CM

## 2022-07-20 DIAGNOSIS — Z148 Genetic carrier of other disease: Secondary | ICD-10-CM

## 2022-07-20 NOTE — Progress Notes (Signed)
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 Iron saturation 62, ferritin 132.  Persistent transaminitis Recommend empiric phebotomy 300cc x1.    Transaminitis Likely due to fatty liver disease Recommend US abdomen RUQ Avoid alcohol.   Erythrocytosis Check EPO, carbo monoxide level, Jak 2 V617F mutation reflex to other mutations, BCR ABL1 FISH  Orders Placed This Encounter  Procedures   BCR-ABL1 FISH    Standing Status:   Future    Number of Occurrences:   1    Standing Expiration Date:   07/21/2023   Carbon monoxide, blood (performed at ref lab)    Standing Status:   Future    Number of Occurrences:   1    Standing Expiration Date:   07/21/2023   Erythropoietin    Standing Status:   Future    Number of Occurrences:   1    Standing Expiration Date:   07/21/2023   JAK2 V617F rfx CALR/MPL/E12-15    Standing Status:   Future    Number of Occurrences:   1    Standing Expiration Date:   07/21/2023   CBC with Differential    Standing Status:   Future    Standing Expiration Date:   07/20/2023   Comprehensive metabolic panel    Standing Status:   Future    Standing Expiration Date:   07/20/2023   Ferritin    Standing Status:   Future    Standing Expiration Date:   07/21/2023   Iron and TIBC([facility])    Standing Status:   Future    Standing Expiration Date:   07/21/2023   Follow up in 6 months.  US abdomen RUQ  All questions were answered. The patient knows to call the clinic with any problems, questions or concerns.  Earlie Server, MD, PhD [facility] Hematology Oncology 07/20/2022   REASON FOR VISIT Follow up for treatment of hemochromatosis, elevated iron panel   HISTORY OF  PRESENTING ILLNESS:  Jennifer Barron is a  41 y.o.  female present for follow up of hemachromatosis carrier, high iron level Patient follows up with GYN for IUD placement, and was fine to have abnormal liver function test, mainly elevated transaminitis.  Patient went to primary care physician Dr. Humphrey Rolls And had additional testing.  She was found to have elevated ferritin level at 547, iron saturation 33, ultrasound of abdomen was done on January 12, 2018, which showed upper normal limits spleen size and increased echogenicity of liver questionable fatty liver. Patient was [facility] to [facility] for further evaluation of elevated ferritin. Currently patient takes spironolactone for acne and this was started by dermatologist a week ago.  She also takes phentermine for obesity, recently started. Patient reports feeling tired fatigue.  Denies any joint pain shortness of breath, lower extremity swelling.  She denies any family history of hemochromatosis.  INTERVAL HISTORY Jennifer Barron is a 41 y.o. female who has above history reviewed by me today presents for follow-up visit for heterozygous hemochromatosis, elevated iron saturation.  + headache frequently.  Drinks alcohol socially  Review of Systems  Constitutional:  Positive for malaise/fatigue. Negative for chills, fever and weight loss.  HENT:  Negative for sore throat.   Eyes:  Negative for redness.  Respiratory:  Negative for cough, shortness of breath  and wheezing.   Cardiovascular:  Negative for chest pain, palpitations and leg swelling.  Gastrointestinal:  Negative for abdominal pain, blood in stool, nausea and vomiting.  Genitourinary:  Negative for dysuria.  Musculoskeletal:  Negative for myalgias.  Skin:  Negative for rash.  Neurological:  Positive for headaches. Negative for dizziness, tingling and tremors.  Endo/Heme/Allergies:  Does not bruise/bleed easily.  Psychiatric/Behavioral:  Negative for hallucinations.     MEDICAL  HISTORY:  Past Medical History:  Diagnosis Date   Endometriosis    Migraine     SURGICAL HISTORY: Past Surgical History:  Procedure Laterality Date   abdominal laproscopy     ADENOIDECTOMY     twice   TONSILLECTOMY Bilateral 1987   WISDOM TOOTH EXTRACTION  2009    SOCIAL HISTORY: Social History   Socioeconomic History   Marital status: Married    Spouse name: Not on file   Number of children: Not on file   Years of education: Not on file   Highest education level: Not on file  Occupational History   Not on file  Tobacco Use   Smoking status: Never   Smokeless tobacco: Never  Vaping Use   Vaping Use: Never used  Substance and Sexual Activity   Alcohol use: Yes    Comment: social   Drug use: No   Sexual activity: Yes  Other Topics Concern   Not on file  Social History Narrative   Not on file   Social Determinants of Health   Financial Resource Strain: Not on file  Food Insecurity: Not on file  Transportation Needs: Not on file  Physical Activity: Not on file  Stress: Not on file  Social Connections: Not on file  Intimate Partner Violence: Not on file    FAMILY HISTORY: Family History  Problem Relation Age of Onset   Breast cancer Mother    Cancer Father    Ovarian cancer Paternal Grandmother    Cancer Paternal Grandmother    Prostate cancer Paternal Grandfather    Cancer Paternal Grandfather     ALLERGIES:  has No Known Allergies.  MEDICATIONS:  Current Outpatient Medications  Medication Sig Dispense Refill   cholecalciferol (VITAMIN D) 1000 units tablet Take 1 tablet (1,000 Units total) by mouth daily. 90 tablet 0   phentermine 37.5 MG capsule Take 1 capsule (37.5 mg total) by mouth every morning. 30 capsule 0   spironolactone (ALDACTONE) 50 MG tablet   4   No current facility-administered medications for this visit.     PHYSICAL EXAMINATION: ECOG PERFORMANCE STATUS: 1 - Symptomatic but completely ambulatory Vitals:   07/20/22 1414  BP:  (!) 133/97  Pulse: 76  Resp: 18  Temp: 99.3 F (37.4 C)   Filed Weights   07/20/22 1414  Weight: 199 lb 11 oz (90.6 kg)     Physical Exam Constitutional:      General: She is not in acute distress.    Appearance: She is obese.  HENT:     Head: Normocephalic and atraumatic.  Eyes:     General: No scleral icterus. Cardiovascular:     Rate and Rhythm: Normal rate and regular rhythm.     Heart sounds: Normal heart sounds. No murmur heard.    No friction rub. No gallop.  Pulmonary:     Effort: Pulmonary effort is normal. No respiratory distress.     Breath sounds: Normal breath sounds.  Abdominal:     General: Bowel sounds are normal. There is no distension.  Palpations: Abdomen is soft.  Musculoskeletal:        General: No deformity. Normal range of motion.     Cervical back: Normal range of motion and neck supple.  Lymphadenopathy:     Cervical: No cervical adenopathy.  Skin:    General: Skin is warm and dry.     Findings: No erythema or rash.  Neurological:     Mental Status: She is alert and oriented to person, place, and time. Mental status is at baseline.     Cranial Nerves: No cranial nerve deficit.  Psychiatric:        Mood and Affect: Mood normal.      LABORATORY DATA:  I have reviewed the data as listed    Latest Ref Rng & Units 07/19/2022    8:27 AM 07/07/2022    9:37 AM 01/17/2022    8:06 AM  CBC  WBC 4.0 - 10.5 K/uL 5.9  5.9  5.6   Hemoglobin 12.0 - 15.0 g/dL 16.3  15.8  15.2   Hematocrit 36.0 - 46.0 % 44.4  45.0  42.7   Platelets 150 - 400 K/uL 300  270  262       Latest Ref Rng & Units 07/19/2022    8:27 AM 07/07/2022    9:37 AM 01/17/2022    8:06 AM  CMP  Glucose 70 - 99 mg/dL 108  88    BUN 6 - 20 mg/dL 10  10    Creatinine 0.44 - 1.00 mg/dL 0.87  0.79    Sodium 135 - 145 mmol/L 134  138    Potassium 3.5 - 5.1 mmol/L 4.8  4.7    Chloride 98 - 111 mmol/L 100  100    CO2 22 - 32 mmol/L 28  25    Calcium 8.9 - 10.3 mg/dL 9.4  10.0     Total Protein 6.5 - 8.1 g/dL 7.6  7.0  6.9   Total Bilirubin 0.3 - 1.2 mg/dL 1.2  1.3  0.7   Alkaline Phos 38 - 126 U/L 56  55  46   AST 15 - 41 U/L 68  132  81   ALT 0 - 44 U/L 101  108  93     Lab Results  Component Value Date   IRON 287 (H) 07/19/2022   TIBC 462 (H) 07/19/2022   FERRITIN 137 07/19/2022   Hepatitis B surface antigen negative, hepatitis C RNA nondetectable.  01/12/2018 US abdomen showed upper normal limits spleen size and increased echogenicity of liver questionable fatty liver.

## 2022-07-20 NOTE — Assessment & Plan Note (Addendum)
Likely due to fatty liver disease Recommend US abdomen RUQ Avoid alcohol.

## 2022-07-20 NOTE — Assessment & Plan Note (Signed)
Heterozygous H63D hemochromatosis/abnormal iron labs/transaminitis Labs are reviewed and discussed with patient. Iron saturation 62, ferritin 132.  Persistent transaminitis Recommend empiric phebotomy 300cc x1.

## 2022-07-20 NOTE — Progress Notes (Signed)
Therapeutic phlebotomy performed in left AC using 20g angiocath. 340m removed. Pt tolerated procedure well. Oral hydration provided. Vital signs stable at discharge.

## 2022-07-20 NOTE — Progress Notes (Signed)
Pt here for follow up. Reports getting frequent headaches.

## 2022-07-20 NOTE — Assessment & Plan Note (Signed)
Check EPO, carbo monoxide level, Jak 2 V617F mutation reflex to other mutations, BCR ABL1 FISH

## 2022-07-21 ENCOUNTER — Ambulatory Visit (INDEPENDENT_AMBULATORY_CARE_PROVIDER_SITE_OTHER): Payer: BC Managed Care – PPO

## 2022-07-21 DIAGNOSIS — E538 Deficiency of other specified B group vitamins: Secondary | ICD-10-CM | POA: Diagnosis not present

## 2022-07-21 LAB — CARBON MONOXIDE, BLOOD (PERFORMED AT REF LAB): Carbon Monoxide, Blood: 2.7 % (ref 0.0–3.6)

## 2022-07-21 LAB — ERYTHROPOIETIN: Erythropoietin: 8.4 m[IU]/mL (ref 2.6–18.5)

## 2022-07-22 MED ORDER — CYANOCOBALAMIN 1000 MCG/ML IJ SOLN
1000.0000 ug | Freq: Once | INTRAMUSCULAR | Status: AC
  Administered 2022-07-21: 1000 ug via INTRAMUSCULAR

## 2022-07-22 NOTE — Progress Notes (Signed)
b12

## 2022-07-23 LAB — BCR-ABL1 FISH
Cells Analyzed: 200
Cells Counted: 200

## 2022-07-26 ENCOUNTER — Ambulatory Visit: Payer: BC Managed Care – PPO

## 2022-07-26 ENCOUNTER — Encounter: Payer: Self-pay | Admitting: Oncology

## 2022-07-26 LAB — CALR +MPL + E12-E15  (REFLEX)

## 2022-07-26 LAB — JAK2 V617F RFX CALR/MPL/E12-15

## 2022-08-03 ENCOUNTER — Ambulatory Visit
Admission: RE | Admit: 2022-08-03 | Discharge: 2022-08-03 | Disposition: A | Discharge status: Home / Self Care | Payer: BC Managed Care – PPO | Source: Ambulatory Visit | Attending: Oncology | Admitting: Oncology

## 2022-08-03 DIAGNOSIS — R7401 Elevation of levels of liver transaminase levels: Secondary | ICD-10-CM | POA: Insufficient documentation

## 2022-08-03 DIAGNOSIS — R748 Abnormal levels of other serum enzymes: Secondary | ICD-10-CM | POA: Diagnosis not present

## 2022-08-03 DIAGNOSIS — Z148 Genetic carrier of other disease: Secondary | ICD-10-CM | POA: Insufficient documentation

## 2022-08-15 ENCOUNTER — Encounter: Payer: Self-pay | Admitting: Physician Assistant

## 2022-08-15 ENCOUNTER — Ambulatory Visit (INDEPENDENT_AMBULATORY_CARE_PROVIDER_SITE_OTHER): Payer: BC Managed Care – PPO | Admitting: Physician Assistant

## 2022-08-15 VITALS — BP 130/90 | HR 88 | Temp 98.1°F | Resp 16 | Ht 65.0 in | Wt 196.8 lb

## 2022-08-15 DIAGNOSIS — E669 Obesity, unspecified: Secondary | ICD-10-CM | POA: Diagnosis not present

## 2022-08-15 DIAGNOSIS — Z23 Encounter for immunization: Secondary | ICD-10-CM | POA: Diagnosis not present

## 2022-08-15 DIAGNOSIS — I10 Essential (primary) hypertension: Secondary | ICD-10-CM | POA: Diagnosis not present

## 2022-08-15 DIAGNOSIS — E538 Deficiency of other specified B group vitamins: Secondary | ICD-10-CM | POA: Diagnosis not present

## 2022-08-15 MED ORDER — CYANOCOBALAMIN 1000 MCG/ML IJ SOLN
1000.0000 ug | Freq: Once | INTRAMUSCULAR | Status: AC
  Administered 2022-08-15: 1000 ug via INTRAMUSCULAR

## 2022-08-15 MED ORDER — PHENTERMINE HCL 37.5 MG PO CAPS: 37.5000 mg | ORAL_CAPSULE | ORAL | 0 refills | Status: DC

## 2022-08-15 NOTE — Progress Notes (Signed)
[facility] [place], [place] [address]  Internal MEDICINE  Office Visit Note  Patient Name: Jennifer Barron  [id number]  [id number]  Date of Service: 08/28/2022  Chief Complaint  Patient presents with   Follow-up   Quality Metric Gaps    Tetanus Vaccine    HPI Pt is here for routine follow up for weight management -down 8lbs since last visit -BP usually normal in morning and evening.  -Denies palpitations, trouble sleeping, or anxiety.  -Starts to get headaches in afternoon, and BP is a little up.  -Sleeping ok -previously took very  low dose carvedilol while on phentermine previously to account for BP and with rising BP in afternoon and headaches in afternoon will retry this once daily. She has some at home still  Current Medication: Outpatient Encounter Medications as of 08/15/2022  Medication Sig   cholecalciferol (VITAMIN D) 1000 units tablet Take 1 tablet (1,000 Units total) by mouth daily.   spironolactone (ALDACTONE) 50 MG tablet    [DISCONTINUED] phentermine 37.5 MG capsule Take 1 capsule (37.5 mg total) by mouth every morning.   phentermine 37.5 MG capsule Take 1 capsule (37.5 mg total) by mouth every morning.   [EXPIRED] cyanocobalamin (VITAMIN B12) injection 1,000 mcg    No facility-administered encounter medications on file as of 08/15/2022.    Surgical History: Past Surgical History:  Procedure Laterality Date   abdominal laproscopy     ADENOIDECTOMY     twice   TONSILLECTOMY Bilateral 1987   WISDOM TOOTH EXTRACTION  2009    Medical History: Past Medical History:  Diagnosis Date   Endometriosis    Migraine     Family History: Family History  Problem Relation Age of Onset   Breast cancer Mother    Cancer Father    Ovarian cancer Paternal Grandmother    Cancer Paternal Grandmother    Prostate cancer Paternal Grandfather    Cancer Paternal Grandfather     Social History   Socioeconomic History   Marital  status: Married    Spouse name: Not on file   Number of children: Not on file   Years of education: Not on file   Highest education level: Not on file  Occupational History   Not on file  Tobacco Use   Smoking status: Never   Smokeless tobacco: Never  Vaping Use   Vaping Use: Never used  Substance and Sexual Activity   Alcohol use: Yes    Comment: social   Drug use: No   Sexual activity: Yes  Other Topics Concern   Not on file  Social History Narrative   Not on file   Social Determinants of Health   Financial Resource Strain: Not on file  Food Insecurity: Not on file  Transportation Needs: Not on file  Physical Activity: Not on file  Stress: Not on file  Social Connections: Not on file  Intimate Partner Violence: Not on file      Review of Systems  Constitutional:  Negative for chills, fatigue and unexpected weight change.  HENT:  Negative for congestion, postnasal drip, rhinorrhea, sneezing and sore throat.   Eyes:  Negative for redness.  Respiratory:  Negative for cough, chest tightness and shortness of breath.   Cardiovascular:  Negative for chest pain and palpitations.  Gastrointestinal:  Negative for abdominal pain, constipation, diarrhea, nausea and vomiting.  Genitourinary:  Negative for dysuria and frequency.  Musculoskeletal:  Negative for arthralgias, back pain, joint swelling and neck pain.  Skin:  Negative for rash.  Neurological:  Positive for headaches. Negative for tremors and numbness.  Hematological:  Negative for adenopathy. Does not bruise/bleed easily.  Psychiatric/Behavioral:  Negative for behavioral problems (Depression), sleep disturbance and suicidal ideas. The patient is not nervous/anxious.     Vital Signs: BP (!) 130/90 Comment: 141/99  Pulse 88   Temp 98.1 F (36.7 C)   Resp 16   Ht 5' 5"  (1.651 m)   Wt 196 lb 12.8 oz (89.3 kg)   SpO2 98%   BMI 32.75 kg/m    Physical Exam Vitals and nursing note reviewed.  Constitutional:       Appearance: Normal appearance.  HENT:     Head: Normocephalic and atraumatic.     Nose: Nose normal.     Mouth/Throat:     Mouth: Mucous membranes are moist.     Pharynx: No posterior oropharyngeal erythema.  Eyes:     Extraocular Movements: Extraocular movements intact.     Pupils: Pupils are equal, round, and reactive to light.  Cardiovascular:     Rate and Rhythm: Normal rate and regular rhythm.     Pulses: Normal pulses.     Heart sounds: Normal heart sounds.  Pulmonary:     Effort: Pulmonary effort is normal.     Breath sounds: Normal breath sounds.  Abdominal:     General: Abdomen is flat.  Musculoskeletal:        General: Normal range of motion.     Cervical back: Normal range of motion.  Skin:    General: Skin is warm and dry.  Neurological:     General: No focal deficit present.     Mental Status: She is alert.  Psychiatric:        Mood and Affect: Mood normal.        Behavior: Behavior normal.        Thought Content: Thought content normal.        Judgment: Judgment normal.        Assessment/Plan: 1. Obesity (BMI 30.0-34.9) Down 8lbs. May continue phentermine and working on diet and exercise - phentermine 37.5 MG capsule; Take 1 capsule (37.5 mg total) by mouth every morning.  Dispense: 30 capsule; Refill: 0  2. Benign hypertension Borderline BP, will continue spironolactone and restart low dose carvedilol once daily  3. B12 deficiency - cyanocobalamin (VITAMIN B12) injection 1,000 mcg  4. Flu vaccine need - Flu Vaccine MDCK QUAD PF   General Counseling: Alexxa verbalizes understanding of the findings of todays visit and agrees with plan of treatment. I have discussed any further diagnostic evaluation that may be needed or ordered today. We also reviewed her medications today. she has been encouraged to call the office with any questions or concerns that should arise related to todays visit.    Orders Placed This Encounter  Procedures   Flu  Vaccine MDCK QUAD PF    Meds ordered this encounter  Medications   cyanocobalamin (VITAMIN B12) injection 1,000 mcg   phentermine 37.5 MG capsule    Sig: Take 1 capsule (37.5 mg total) by mouth every morning.    Dispense:  30 capsule    Refill:  0    This patient was seen by Drema Dallas, PA-C in collaboration with Dr. Clayborn Bigness as a part of collaborative care agreement.   Total time spent:30 Minutes Time spent includes review of chart, medications, test results, and follow up plan with the patient.      Dr  Lavera Guise Internal medicine

## 2022-08-18 ENCOUNTER — Ambulatory Visit: Payer: BC Managed Care – PPO

## 2022-09-12 ENCOUNTER — Encounter: Payer: Self-pay | Admitting: Physician Assistant

## 2022-09-12 ENCOUNTER — Ambulatory Visit (INDEPENDENT_AMBULATORY_CARE_PROVIDER_SITE_OTHER): Payer: BC Managed Care – PPO | Admitting: Physician Assistant

## 2022-09-12 VITALS — BP 130/94 | HR 77 | Temp 97.8°F | Resp 16 | Ht 65.0 in | Wt 200.8 lb

## 2022-09-12 DIAGNOSIS — E669 Obesity, unspecified: Secondary | ICD-10-CM | POA: Diagnosis not present

## 2022-09-12 DIAGNOSIS — E538 Deficiency of other specified B group vitamins: Secondary | ICD-10-CM | POA: Diagnosis not present

## 2022-09-12 DIAGNOSIS — I10 Essential (primary) hypertension: Secondary | ICD-10-CM | POA: Diagnosis not present

## 2022-09-12 MED ORDER — CYANOCOBALAMIN 1000 MCG/ML IJ SOLN
1000.0000 ug | Freq: Once | INTRAMUSCULAR | Status: AC
  Administered 2022-09-12: 1000 ug via INTRAMUSCULAR

## 2022-09-12 MED ORDER — BUPROPION HCL ER (XL) 150 MG PO TB24: 150.0000 mg | ORAL_TABLET | Freq: Every day | ORAL | 2 refills | Status: DC

## 2022-09-12 MED ORDER — AMLODIPINE BESYLATE 2.5 MG PO TABS: 2.5000 mg | ORAL_TABLET | Freq: Every day | ORAL | 2 refills | Status: DC

## 2022-09-12 NOTE — Progress Notes (Signed)
[facility] [place], [place] [address]  Internal MEDICINE  Office Visit Note  Patient Name: Jennifer Barron  [id number]  [id number]  Date of Service: 09/21/2022  Chief Complaint  Patient presents with   Follow-up    Weight loss    HPI Pt is here for routine follow up -Quit phentermine not long after last visit because of worsening headaches and causing some worsening anxiety -She is taking spironolactone, but not carvedilol -BP has been up in afternoons with stress with work, always seems to correlate with stress and may improve if this could improve. Discussed restarting wellbutrin to aid weight loss while helping managing stress and will also start low dose amlodipine to help control BP -interested in new weight loss med in new year if possible and will focus on diet and exercise  Current Medication: Outpatient Encounter Medications as of 09/12/2022  Medication Sig   amLODipine (NORVASC) 2.5 MG tablet Take 1 tablet (2.5 mg total) by mouth daily.   buPROPion (WELLBUTRIN XL) 150 MG 24 hr tablet Take 1 tablet (150 mg total) by mouth daily.   cholecalciferol (VITAMIN D) 1000 units tablet Take 1 tablet (1,000 Units total) by mouth daily.   phentermine 37.5 MG capsule Take 1 capsule (37.5 mg total) by mouth every morning.   spironolactone (ALDACTONE) 50 MG tablet    [EXPIRED] cyanocobalamin (VITAMIN B12) injection 1,000 mcg    No facility-administered encounter medications on file as of 09/12/2022.    Surgical History: Past Surgical History:  Procedure Laterality Date   abdominal laproscopy     ADENOIDECTOMY     twice   TONSILLECTOMY Bilateral 1987   WISDOM TOOTH EXTRACTION  2009    Medical History: Past Medical History:  Diagnosis Date   Endometriosis    Migraine     Family History: Family History  Problem Relation Age of Onset   Breast cancer Mother    Cancer Father    Ovarian cancer Paternal Grandmother    Cancer Paternal  Grandmother    Prostate cancer Paternal Grandfather    Cancer Paternal Grandfather     Social History   Socioeconomic History   Marital status: Married    Spouse name: Not on file   Number of children: Not on file   Years of education: Not on file   Highest education level: Not on file  Occupational History   Not on file  Tobacco Use   Smoking status: Never   Smokeless tobacco: Never  Vaping Use   Vaping Use: Never used  Substance and Sexual Activity   Alcohol use: Yes    Comment: social   Drug use: No   Sexual activity: Yes  Other Topics Concern   Not on file  Social History Narrative   Not on file   Social Determinants of Health   Financial Resource Strain: Not on file  Food Insecurity: Not on file  Transportation Needs: Not on file  Physical Activity: Not on file  Stress: Not on file  Social Connections: Not on file  Intimate Partner Violence: Not on file      Review of Systems  Constitutional:  Negative for chills, fatigue and unexpected weight change.  HENT:  Negative for congestion, postnasal drip, rhinorrhea, sneezing and sore throat.   Eyes:  Negative for redness.  Respiratory:  Negative for cough, chest tightness and shortness of breath.   Cardiovascular:  Negative for chest pain and palpitations.  Gastrointestinal:  Negative for abdominal pain, constipation, diarrhea,  nausea and vomiting.  Genitourinary:  Negative for dysuria and frequency.  Musculoskeletal:  Negative for arthralgias, back pain, joint swelling and neck pain.  Skin:  Negative for rash.  Neurological:  Positive for headaches. Negative for tremors and numbness.  Hematological:  Negative for adenopathy. Does not bruise/bleed easily.  Psychiatric/Behavioral:  Negative for behavioral problems (Depression), sleep disturbance and suicidal ideas. The patient is not nervous/anxious.     Vital Signs: BP (!) 130/94 Comment: 138/95  Pulse 77   Temp 97.8 F (36.6 C)   Resp 16   Ht 5' 5"   (1.651 m)   Wt 200 lb 12.8 oz (91.1 kg)   SpO2 98%   BMI 33.41 kg/m    Physical Exam Vitals and nursing note reviewed.  Constitutional:      Appearance: Normal appearance.  HENT:     Head: Normocephalic and atraumatic.     Nose: Nose normal.     Mouth/Throat:     Mouth: Mucous membranes are moist.     Pharynx: No posterior oropharyngeal erythema.  Eyes:     Extraocular Movements: Extraocular movements intact.     Pupils: Pupils are equal, round, and reactive to light.  Cardiovascular:     Rate and Rhythm: Normal rate and regular rhythm.     Pulses: Normal pulses.     Heart sounds: Normal heart sounds.  Pulmonary:     Effort: Pulmonary effort is normal.     Breath sounds: Normal breath sounds.  Abdominal:     General: Abdomen is flat.  Musculoskeletal:        General: Normal range of motion.     Cervical back: Normal range of motion.  Skin:    General: Skin is warm and dry.  Neurological:     General: No focal deficit present.     Mental Status: She is alert.  Psychiatric:        Mood and Affect: Mood normal.        Behavior: Behavior normal.        Thought Content: Thought content normal.        Judgment: Judgment normal.        Assessment/Plan: 1. Benign hypertension Will start low dose amlodipine to help control BP - amLODipine (NORVASC) 2.5 MG tablet; Take 1 tablet (2.5 mg total) by mouth daily.  Dispense: 30 tablet; Refill: 2  2. Obesity (BMI 30.0-34.9) Will restart wellbutrin to aid wt loss and help with stress, may consider alternative wt loss med in new year. Will continue to restrict calories and improve diet and exercise  3. B12 deficiency - cyanocobalamin (VITAMIN B12) injection 1,000 mcg   General Counseling: Ahliyah verbalizes understanding of the findings of todays visit and agrees with plan of treatment. I have discussed any further diagnostic evaluation that may be needed or ordered today. We also reviewed her medications today. she has been  encouraged to call the office with any questions or concerns that should arise related to todays visit.    No orders of the defined types were placed in this encounter.   Meds ordered this encounter  Medications   cyanocobalamin (VITAMIN B12) injection 1,000 mcg   amLODipine (NORVASC) 2.5 MG tablet    Sig: Take 1 tablet (2.5 mg total) by mouth daily.    Dispense:  30 tablet    Refill:  2   buPROPion (WELLBUTRIN XL) 150 MG 24 hr tablet    Sig: Take 1 tablet (150 mg total) by mouth daily.  Dispense:  30 tablet    Refill:  2    This patient was seen by Drema Dallas, PA-C in collaboration with Dr. Clayborn Bigness as a part of collaborative care agreement.   Total time spent:30 Minutes Time spent includes review of chart, medications, test results, and follow up plan with the patient.      Dr Lavera Guise Internal medicine

## 2022-09-14 ENCOUNTER — Ambulatory Visit: Payer: BC Managed Care – PPO

## 2022-10-01 IMAGING — MG MM DIGITAL SCREENING BILAT W/ TOMO AND CAD
8 series · 8 of 24 positions shown · non-contrast
Comparison: Previous exam(s).

CLINICAL DATA: Screening.

EXAM:
DIGITAL SCREENING BILATERAL MAMMOGRAM WITH TOMOSYNTHESIS AND CAD
TECHNIQUE: Bilateral screening digital craniocaudal and mediolateral oblique
mammograms were obtained. Bilateral screening digital breast
tomosynthesis was performed. The images were evaluated with
computer-aided detection.

[R CC synth-2D]
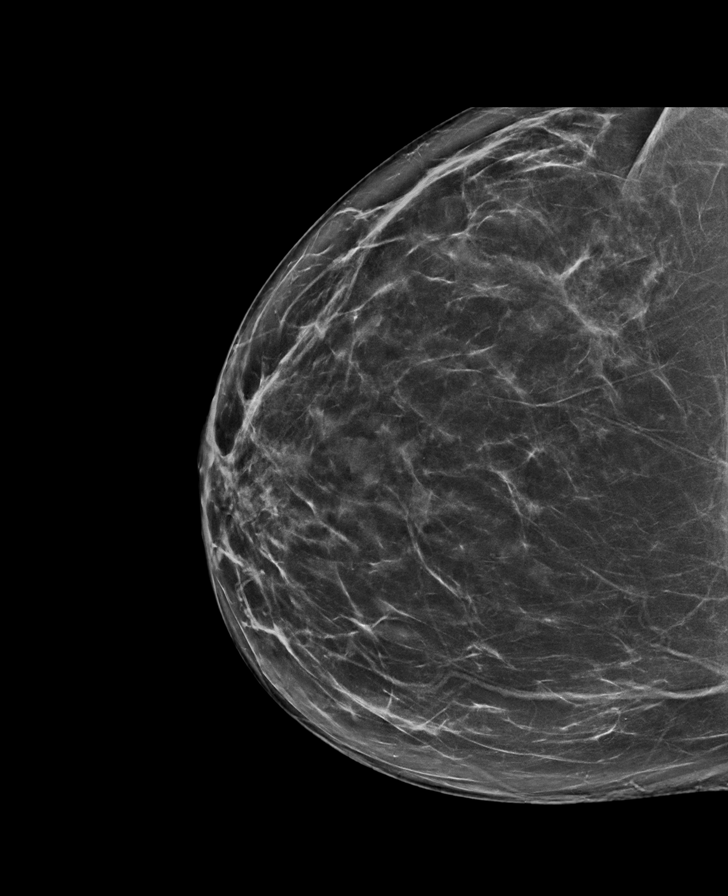

[R MLO synth-2D]
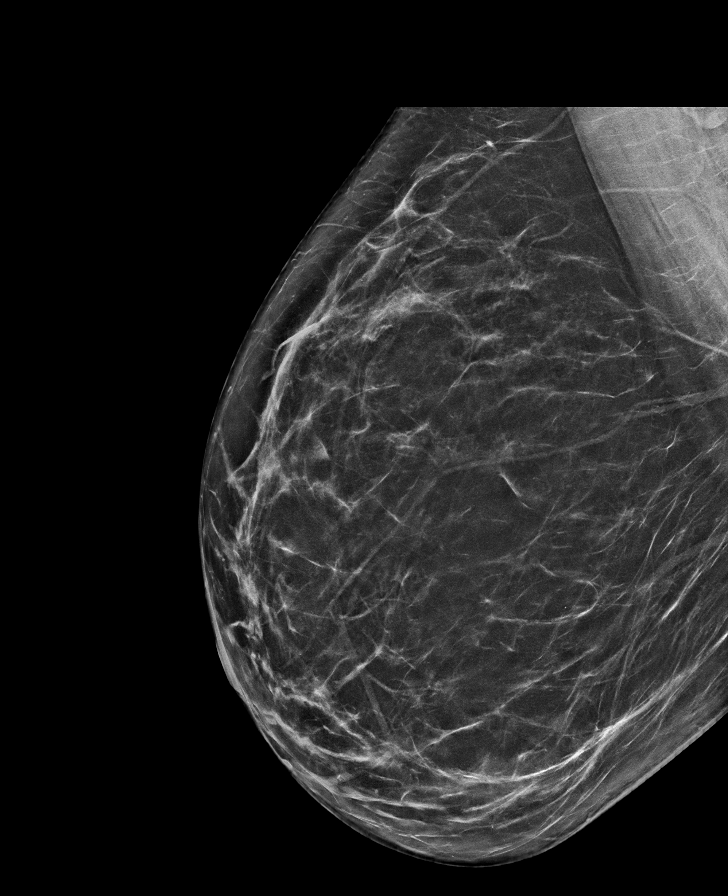

[L MLO synth-2D]
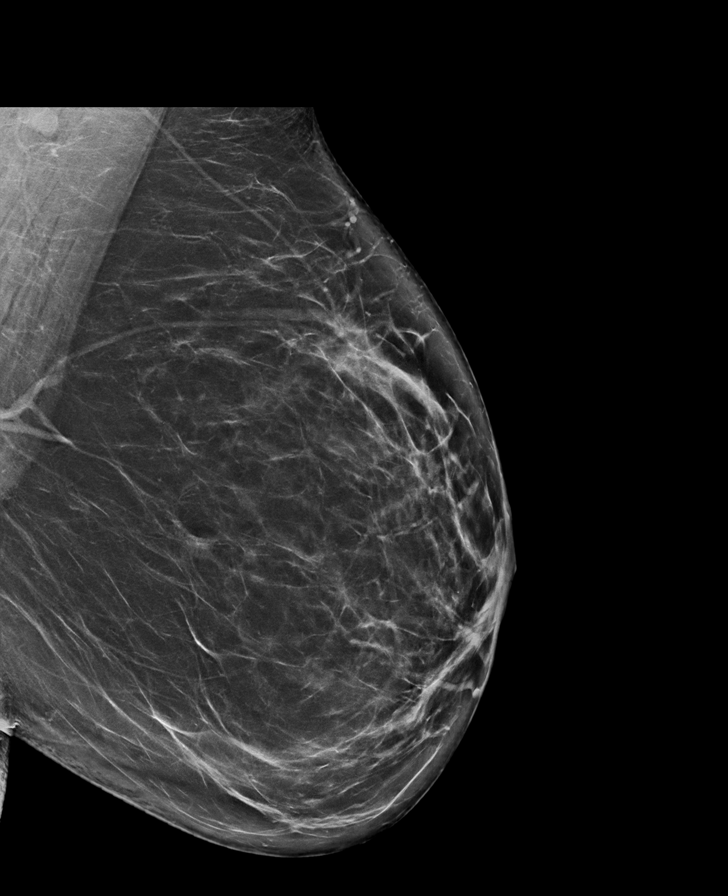

[L CC synth-2D]
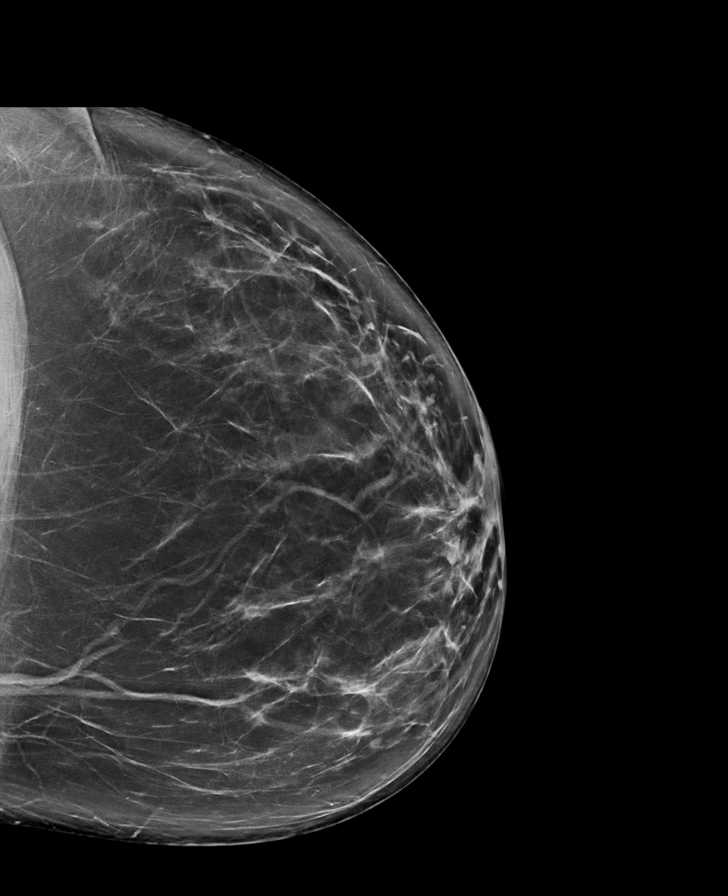

[L CC tomo · tomo slice 45/89.0]
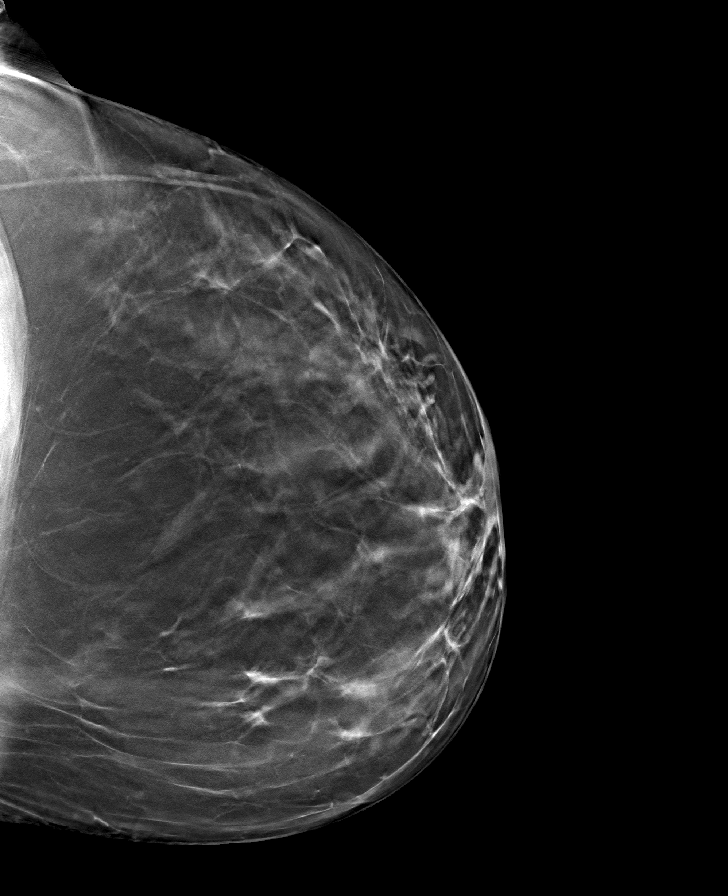

[L MLO tomo · tomo slice 46/91.0]
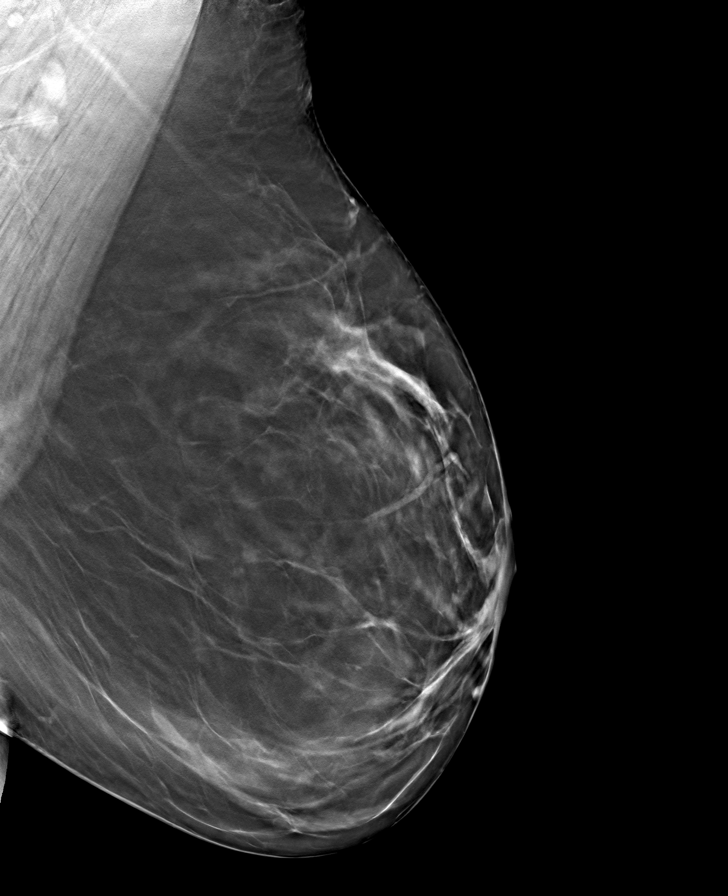

[R CC tomo · tomo slice 43/84.0]
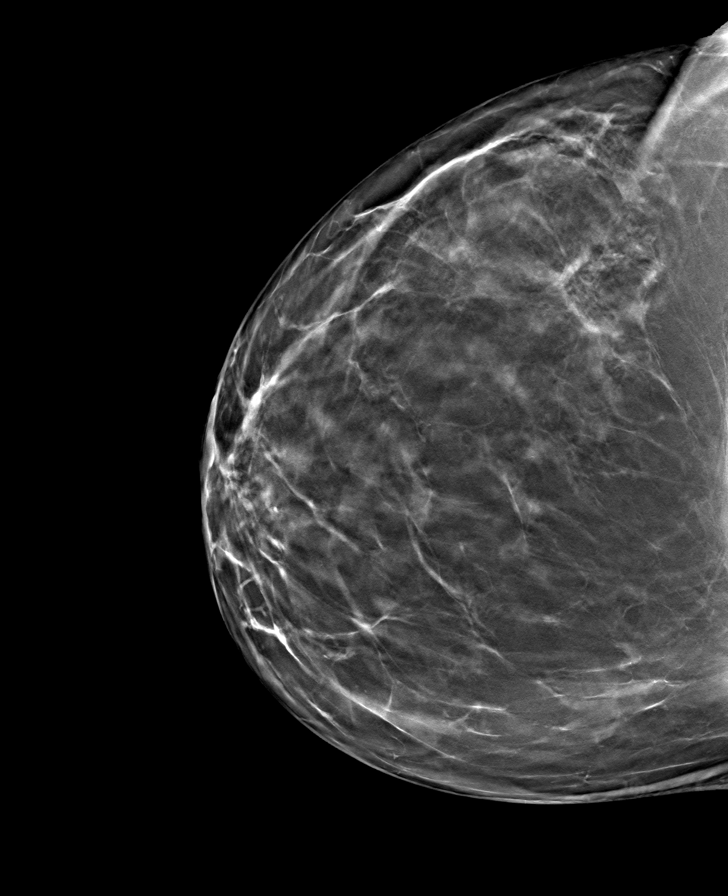

[R MLO tomo · tomo slice 42/83.0]
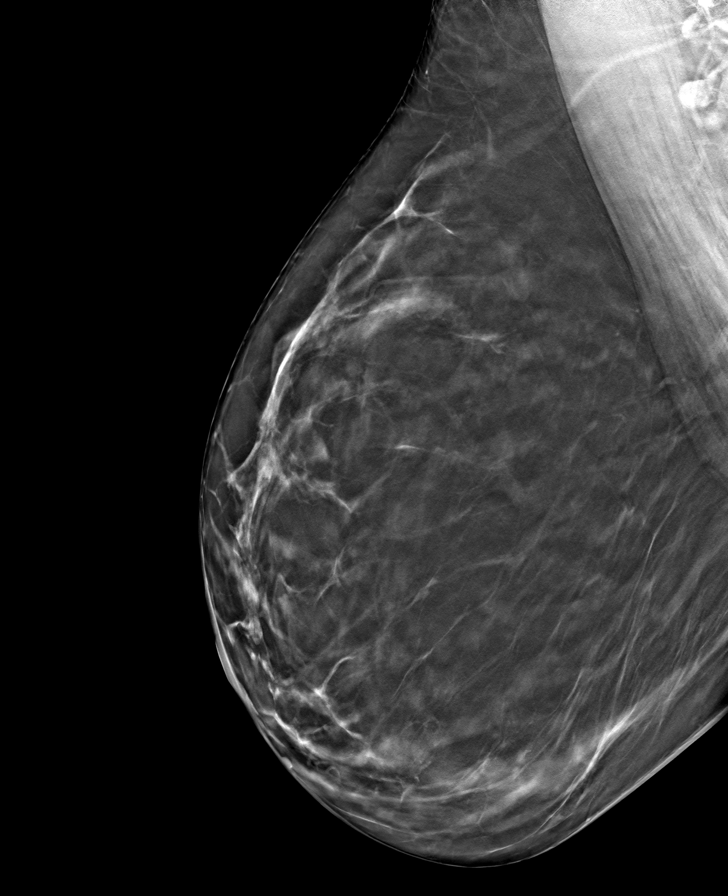

[8 of 24 positions shown; findings below may reference images not displayed]

ACR Breast Density Category b: There are scattered areas of
fibroglandular density.
FINDINGS: There are no findings suspicious for malignancy.
IMPRESSION: No mammographic evidence of malignancy. A result letter of this
screening mammogram will be mailed directly to the patient.

RECOMMENDATION:
Screening mammogram in one year. (Code:51-O-LD2)

BI-RADS CATEGORY  1: Negative.

## 2022-10-20 ENCOUNTER — Ambulatory Visit: Payer: BC Managed Care – PPO

## 2022-10-31 ENCOUNTER — Ambulatory Visit: Payer: BC Managed Care – PPO | Admitting: Physician Assistant

## 2022-10-31 ENCOUNTER — Encounter: Payer: Self-pay | Admitting: Physician Assistant

## 2022-10-31 VITALS — BP 158/98 | HR 82 | Temp 97.3°F | Resp 16 | Ht 65.0 in | Wt 210.8 lb

## 2022-10-31 DIAGNOSIS — E669 Obesity, unspecified: Secondary | ICD-10-CM

## 2022-10-31 DIAGNOSIS — I10 Essential (primary) hypertension: Secondary | ICD-10-CM | POA: Diagnosis not present

## 2022-10-31 NOTE — Progress Notes (Signed)
[facility] [place], [place] [address]  Internal MEDICINE  Office Visit Note  Patient Name: Jennifer Barron  [id number]  [id number]  Date of Service: 11/11/2022  Chief Complaint  Patient presents with   Follow-up    HPI Pt is here for routine follow up -More headaches recently -tolerating wellbutrin -158/98, has not taken amlodipine and will do so now. May need to titrate up -She had her niece and nephew over the weekend so less sleep which may be impacting BP as well -She did also get upsetting news from a friend right before visit that may also contribute  Current Medication: Outpatient Encounter Medications as of 10/31/2022  Medication Sig   amLODipine (NORVASC) 2.5 MG tablet Take 1 tablet (2.5 mg total) by mouth daily.   buPROPion (WELLBUTRIN XL) 150 MG 24 hr tablet Take 1 tablet (150 mg total) by mouth daily.   cholecalciferol (VITAMIN D) 1000 units tablet Take 1 tablet (1,000 Units total) by mouth daily.   spironolactone (ALDACTONE) 50 MG tablet    [DISCONTINUED] phentermine 37.5 MG capsule Take 1 capsule (37.5 mg total) by mouth every morning.   No facility-administered encounter medications on file as of 10/31/2022.    Surgical History: Past Surgical History:  Procedure Laterality Date   abdominal laproscopy     ADENOIDECTOMY     twice   TONSILLECTOMY Bilateral 1987   WISDOM TOOTH EXTRACTION  2009    Medical History: Past Medical History:  Diagnosis Date   Endometriosis    Migraine     Family History: Family History  Problem Relation Age of Onset   Breast cancer Mother    Cancer Father    Ovarian cancer Paternal Grandmother    Cancer Paternal Grandmother    Prostate cancer Paternal Grandfather    Cancer Paternal Grandfather     Social History   Socioeconomic History   Marital status: Married    Spouse name: Not on file   Number of children: Not on file   Years of education: Not on file   Highest education level:  Not on file  Occupational History   Not on file  Tobacco Use   Smoking status: Never   Smokeless tobacco: Never  Vaping Use   Vaping Use: Never used  Substance and Sexual Activity   Alcohol use: Yes    Comment: social   Drug use: No   Sexual activity: Yes  Other Topics Concern   Not on file  Social History Narrative   Not on file   Social Determinants of Health   Financial Resource Strain: Not on file  Food Insecurity: Not on file  Transportation Needs: Not on file  Physical Activity: Not on file  Stress: Not on file  Social Connections: Not on file  Intimate Partner Violence: Not on file      Review of Systems  Constitutional:  Negative for chills, fatigue and unexpected weight change.  HENT:  Negative for congestion, postnasal drip, rhinorrhea, sneezing and sore throat.   Eyes:  Negative for redness.  Respiratory:  Negative for cough, chest tightness and shortness of breath.   Cardiovascular:  Negative for chest pain and palpitations.  Gastrointestinal:  Negative for abdominal pain, constipation, diarrhea, nausea and vomiting.  Genitourinary:  Negative for dysuria and frequency.  Musculoskeletal:  Negative for arthralgias, back pain, joint swelling and neck pain.  Skin:  Negative for rash.  Neurological:  Positive for headaches. Negative for tremors and numbness.  Hematological:  Negative  for adenopathy. Does not bruise/bleed easily.  Psychiatric/Behavioral:  Negative for behavioral problems (Depression), sleep disturbance and suicidal ideas. The patient is not nervous/anxious.     Vital Signs: BP (!) 158/98 Comment: 162/109  Pulse 82   Temp (!) 97.3 F (36.3 C)   Resp 16   Ht 5\' 5"  (1.651 m)   Wt 210 lb 12.8 oz (95.6 kg)   SpO2 99%   BMI 35.08 kg/m    Physical Exam Vitals and nursing note reviewed.  Constitutional:      Appearance: Normal appearance.  HENT:     Head: Normocephalic and atraumatic.     Nose: Nose normal.     Mouth/Throat:     Mouth:  Mucous membranes are moist.     Pharynx: No posterior oropharyngeal erythema.  Eyes:     Extraocular Movements: Extraocular movements intact.     Pupils: Pupils are equal, round, and reactive to light.  Cardiovascular:     Rate and Rhythm: Normal rate and regular rhythm.     Pulses: Normal pulses.     Heart sounds: Normal heart sounds.  Pulmonary:     Effort: Pulmonary effort is normal.     Breath sounds: Normal breath sounds.  Abdominal:     General: Abdomen is flat.  Musculoskeletal:        General: Normal range of motion.     Cervical back: Normal range of motion.  Skin:    General: Skin is warm and dry.  Neurological:     General: No focal deficit present.     Mental Status: She is alert.  Psychiatric:        Thought Content: Thought content normal.        Judgment: Judgment normal.     Comments: Tearful in office due to hearing upsetting news just prior to visit        Assessment/Plan: 1. Benign hypertension Will start amlodipine daily and continue to monitor BP. May titrate up if needed  2. Obesity (BMI 30-39.9) Continue to work on diet and exercise   General Counseling: Jennifer Barron verbalizes understanding of the findings of todays visit and agrees with plan of treatment. I have discussed any further diagnostic evaluation that may be needed or ordered today. We also reviewed her medications today. she has been encouraged to call the office with any questions or concerns that should arise related to todays visit.    No orders of the defined types were placed in this encounter.   No orders of the defined types were placed in this encounter.   This patient was seen by , PA-C in collaboration with Dr. Lynn Ito as a part of collaborative care agreement.   Total time spent:30 Minutes Time spent includes review of chart, medications, test results, and follow up plan with the patient.      Dr Beverely Risen Internal medicine

## 2022-11-15 ENCOUNTER — Other Ambulatory Visit: Payer: Self-pay | Admitting: Physician Assistant

## 2022-11-15 DIAGNOSIS — I10 Essential (primary) hypertension: Secondary | ICD-10-CM

## 2022-11-17 ENCOUNTER — Ambulatory Visit: Payer: BC Managed Care – PPO

## 2022-11-24 ENCOUNTER — Telehealth: Payer: BC Managed Care – PPO | Admitting: Physician Assistant

## 2022-11-28 ENCOUNTER — Ambulatory Visit: Payer: BC Managed Care – PPO | Admitting: Physician Assistant

## 2022-12-11 ENCOUNTER — Telehealth: Payer: BC Managed Care – PPO | Admitting: Family

## 2022-12-11 DIAGNOSIS — B002 Herpesviral gingivostomatitis and pharyngotonsillitis: Secondary | ICD-10-CM | POA: Diagnosis not present

## 2022-12-11 MED ORDER — VALACYCLOVIR HCL 1 G PO TABS: 2000.0000 mg | ORAL_TABLET | Freq: Two times a day (BID) | ORAL | 0 refills | Status: AC

## 2022-12-11 NOTE — Progress Notes (Signed)
We are sorry that you are not feeling well.  Here is how we plan to help!  Based on what you have shared with me it looks like you have a Cold Sore. A cold sore (fever blister) is a skin infection caused by the herpes simplex virus (HSV-1). HSV-1 is closely related to the virus that causes genital herpes (HSV-2), but they are not the same even though both viruses can cause oral and genital infections. Cold sores are small, fluid-filled sores inside of the mouth or on the lips, gums, nose, chin, cheeks, or fingers.   The herpes simplex virus can be easily passed (contagious) to other people through close personal contact, such as kissing or sharing personal items. The virus can also spread to other parts of the body, such as the eyes or genitals. Cold sores are contagious until the sores crust over completely. They often heal within 2 weeks. Once a person is infected, the herpes simplex virus remains permanently in the body. Therefore, there is no cure for cold sores, and they often recur when a person is tired, stressed, sick, or gets too much sun. Additional factors that can cause a recurrence include hormone changes in menstruation or pregnancy, certain drugs, and cold weather.  CAUSES  Cold sores are caused by the herpes simplex virus. The virus is spread from person to person through close contact, such as through kissing, touching the affected area, or sharing personal items such as lip balm, razors, or eating utensils.   I have sent in a prescription to your pharmacy of Valacyclovir, or Valtrex, 2000 mg twice a day for 1 day.   HOME CARE INSTRUCTIONS  Only take over-the-counter or prescription medicines for pain, discomfort, or fever as directed by your caregiver. Do not use aspirin.   Use a cotton-tip swab to apply creams or gels to your sores.   Do not touch the sores or pick the scabs. Wash your hands often. Do not touch your eyes without washing your hands first.   Avoid kissing, oral sex,  and sharing personal items until sores heal.   Apply an ice pack on your sores for 10-15 minutes to ease any discomfort.   Avoid hot, cold, or salty foods because they may hurt your mouth. Eat a soft, bland diet to avoid irritating the sores. Use a straw to drink if you have pain when drinking out of a glass.   Keep sores clean and dry to prevent an infection of other tissues.   Avoid the sun and limit stress if these things trigger outbreaks. If sun causes cold sores, apply sunscreen on the lips before being out in the sun.   SEEK MEDICAL CARE IF:  You have a fever or persistent symptoms for more than 2-3 days.   You have a fever and your symptoms suddenly get worse.   You have pus, not clear fluid, coming from the sores.   You have redness that is spreading.   You have pain or irritation in your eye.   You get sores on your genitals.   Your sores do not heal within 2 weeks.   You have a weakened immune system.   You have frequent recurrences of cold sores.     MAKE SURE YOU  Understand these instructions. Will watch your condition. Will get help right away if you are not doing well or get worse.  Your e-visit answers were reviewed by a board certified advanced clinical practitioner to complete your personal care plan.  Depending on the condition, your plan could have included both over the counter or prescription medications.  If there is a problem please reply  once you have received a response from your provider.  Your safety is important to [place].  If you have drug allergies check your prescription carefully.    You can use MyChart to ask questions about today's visit, request a non-urgent call back, or ask for a work or school excuse.  You will get an e-mail in the next two days asking about your experience.  I hope that your e-visit has been valuable and will speed your recovery. Thank you for using e-visits.   Approximately 5 minutes was spent documenting and reviewing patient's  chart.

## 2022-12-12 ENCOUNTER — Ambulatory Visit (INDEPENDENT_AMBULATORY_CARE_PROVIDER_SITE_OTHER): Payer: BC Managed Care – PPO | Admitting: Physician Assistant

## 2022-12-12 ENCOUNTER — Encounter: Payer: Self-pay | Admitting: Physician Assistant

## 2022-12-12 VITALS — BP 138/95 | HR 91 | Temp 98.3°F | Resp 16 | Ht 65.0 in | Wt 210.0 lb

## 2022-12-12 DIAGNOSIS — E538 Deficiency of other specified B group vitamins: Secondary | ICD-10-CM | POA: Diagnosis not present

## 2022-12-12 DIAGNOSIS — E669 Obesity, unspecified: Secondary | ICD-10-CM

## 2022-12-12 DIAGNOSIS — I10 Essential (primary) hypertension: Secondary | ICD-10-CM

## 2022-12-12 DIAGNOSIS — G4710 Hypersomnia, unspecified: Secondary | ICD-10-CM

## 2022-12-12 DIAGNOSIS — D582 Other hemoglobinopathies: Secondary | ICD-10-CM

## 2022-12-12 MED ORDER — CYANOCOBALAMIN 1000 MCG/ML IJ SOLN
1000.0000 ug | Freq: Once | INTRAMUSCULAR | Status: AC
  Administered 2022-12-12: 1000 ug via INTRAMUSCULAR

## 2022-12-12 MED ORDER — AMLODIPINE BESYLATE 10 MG PO TABS: 10.0000 mg | ORAL_TABLET | Freq: Every day | ORAL | 2 refills | Status: DC

## 2022-12-12 NOTE — Progress Notes (Signed)
[facility] [place], [place] [address]  Internal MEDICINE  Office Visit Note  Patient Name: Jennifer Barron  [id number]  [id number]  Date of Service: 12/15/2022  Chief Complaint  Patient presents with   Follow-up    HPI Pt is here for routine follow up -taking '5mg'$  amlodipine, will increase to '10mg'$  to continues high diastolic. Will also check echo -BP 120/90s at home -Stopped wellbutrin didn't really help -Snoring at night, not aware of any gasping/startling awake -not refreshed upon waking -waking several times  EPWORTH SLEEPINESS SCALE:  Scale:  (0)= no chance of dozing; (1)= slight chance of dozing; (2)= moderate chance of dozing; (3)= high chance of dozing  Chance  Situtation    Sitting and reading: 0    Watching TV: 2    Sitting Inactive in public: 0    As a passenger in car: 1      Lying down to rest: 1    Sitting and talking: 0    Sitting quielty after lunch: 0    In a car, stopped in traffic: 0   TOTAL SCORE:   4 out of 24  STOP BANG RISK ASSESSMENT S (snore) Have you been told that you snore?     YES   T (tired) Are you often tired, fatigued, or sleepy during the day?   YES  O (obstruction) Do you stop breathing, choke, or gasp during sleep? NO   P (pressure) Do you have or are you being treated for high blood pressure? YES   B (BMI) Is your body index greater than 35 kg/m? YES   A (age) Are you 42 years old or older? NO   N (neck) Do you have a neck circumference greater than 16 inches?   YES/NO   G (gender) Are you a female? NO   TOTAL STOP/BANG "YES" ANSWERS                                                                        For Office Use Only              Procedure Order Form     A STOP-Bang score of 2 or less is considered low risk, and a score of 5 or more is high risk for having either moderate or severe OSA. For people who score 3 or 4, doctors may need to perform further assessment to determine how  likely they are to have OSA.         Current Medication: Outpatient Encounter Medications as of 12/12/2022  Medication Sig   amLODipine (NORVASC) 10 MG tablet Take 1 tablet (10 mg total) by mouth daily.   buPROPion (WELLBUTRIN XL) 150 MG 24 hr tablet Take 1 tablet (150 mg total) by mouth daily.   cholecalciferol (VITAMIN D) 1000 units tablet Take 1 tablet (1,000 Units total) by mouth daily.   spironolactone (ALDACTONE) 50 MG tablet    [EXPIRED] valACYclovir (VALTREX) 1000 MG tablet Take 2 tablets (2,000 mg total) by mouth 2 (two) times daily for 1 day.   [DISCONTINUED] amLODipine (NORVASC) 2.5 MG tablet TAKE 1 TABLET BY MOUTH DAILY   [EXPIRED] cyanocobalamin (VITAMIN B12) injection 1,000 mcg    No facility-administered encounter  medications on file as of 12/12/2022.    Surgical History: Past Surgical History:  Procedure Laterality Date   abdominal laproscopy     ADENOIDECTOMY     twice   TONSILLECTOMY Bilateral 1987   WISDOM TOOTH EXTRACTION  2009    Medical History: Past Medical History:  Diagnosis Date   Endometriosis    Migraine     Family History: Family History  Problem Relation Age of Onset   Breast cancer Mother    Cancer Father    Ovarian cancer Paternal Grandmother    Cancer Paternal Grandmother    Prostate cancer Paternal Grandfather    Cancer Paternal Grandfather     Social History   Socioeconomic History   Marital status: Married    Spouse name: Not on file   Number of children: Not on file   Years of education: Not on file   Highest education level: Not on file  Occupational History   Not on file  Tobacco Use   Smoking status: Never   Smokeless tobacco: Never  Vaping Use   Vaping Use: Never used  Substance and Sexual Activity   Alcohol use: Yes    Comment: social   Drug use: No   Sexual activity: Yes  Other Topics Concern   Not on file  Social History Narrative   Not on file   Social Determinants of Health   Financial Resource Strain:  Not on file  Food Insecurity: Not on file  Transportation Needs: Not on file  Physical Activity: Not on file  Stress: Not on file  Social Connections: Not on file  Intimate Partner Violence: Not on file      Review of Systems  Constitutional:  Negative for chills, fatigue and unexpected weight change.  HENT:  Negative for congestion, postnasal drip, rhinorrhea, sneezing and sore throat.   Eyes:  Negative for redness.  Respiratory:  Negative for cough, chest tightness and shortness of breath.   Cardiovascular:  Negative for chest pain and palpitations.  Gastrointestinal:  Negative for abdominal pain, constipation, diarrhea, nausea and vomiting.  Genitourinary:  Negative for dysuria and frequency.  Musculoskeletal:  Negative for arthralgias, back pain, joint swelling and neck pain.  Skin:  Negative for rash.  Neurological:  Negative for tremors and numbness.  Hematological:  Negative for adenopathy. Does not bruise/bleed easily.  Psychiatric/Behavioral:  Positive for sleep disturbance. Negative for behavioral problems (Depression) and suicidal ideas. The patient is not nervous/anxious.     Vital Signs: BP (!) 138/95   Pulse 91   Temp 98.3 F (36.8 C)   Resp 16   Ht '5\' 5"'$  (1.651 m)   Wt 210 lb (95.3 kg)   SpO2 95%   BMI 34.95 kg/m    Physical Exam Vitals and nursing note reviewed.  Constitutional:      Appearance: Normal appearance.  HENT:     Head: Normocephalic and atraumatic.     Nose: Nose normal.     Mouth/Throat:     Mouth: Mucous membranes are moist.     Pharynx: No posterior oropharyngeal erythema.  Eyes:     Extraocular Movements: Extraocular movements intact.     Pupils: Pupils are equal, round, and reactive to light.  Cardiovascular:     Rate and Rhythm: Normal rate and regular rhythm.     Pulses: Normal pulses.     Heart sounds: Normal heart sounds.  Pulmonary:     Effort: Pulmonary effort is normal.     Breath sounds: Normal  breath sounds.   Abdominal:     General: Abdomen is flat.  Musculoskeletal:        General: Normal range of motion.     Cervical back: Normal range of motion.  Skin:    General: Skin is warm and dry.  Neurological:     General: No focal deficit present.     Mental Status: She is alert.  Psychiatric:        Mood and Affect: Mood normal.        Behavior: Behavior normal.        Thought Content: Thought content normal.        Judgment: Judgment normal.        Assessment/Plan: 1. Benign hypertension Will increase to '10mg'$  amlodipine and will order echo for further evaluation of consistently elevated diastolic pressure. Will also order sleep study for further evaluation - amLODipine (NORVASC) 10 MG tablet; Take 1 tablet (10 mg total) by mouth daily.  Dispense: 30 tablet; Refill: 2 - ECHOCARDIOGRAM COMPLETE; Future - PSG SLEEP STUDY; Future  2. Hypersomnia Due to snoring, daytime fatigue, multiple nighttime awakenings, uncontrolled HTN, elevated BMI, and elevated hemoglobin, will go ahead and order PSG for further evaluation - PSG SLEEP STUDY; Future  3. Elevated hemoglobin (HCC) - PSG SLEEP STUDY; Future  4. B12 deficiency - cyanocobalamin (VITAMIN B12) injection 1,000 mcg  5. Obesity (BMI 30-39.9) Will order PSG due to multiple symptoms concerning for sleep apnea and patient's inability to lose weight despite numerous efforts. - PSG SLEEP STUDY; Future   General Counseling: Lianah verbalizes understanding of the findings of todays visit and agrees with plan of treatment. I have discussed any further diagnostic evaluation that may be needed or ordered today. We also reviewed her medications today. she has been encouraged to call the office with any questions or concerns that should arise related to todays visit.    Orders Placed This Encounter  Procedures   ECHOCARDIOGRAM COMPLETE   PSG SLEEP STUDY    Meds ordered this encounter  Medications   amLODipine (NORVASC) 10 MG tablet     Sig: Take 1 tablet (10 mg total) by mouth daily.    Dispense:  30 tablet    Refill:  2   cyanocobalamin (VITAMIN B12) injection 1,000 mcg    This patient was seen by Drema Dallas, PA-C in collaboration with Dr. Clayborn Bigness as a part of collaborative care agreement.   Total time spent:30 Minutes Time spent includes review of chart, medications, test results, and follow up plan with the patient.      Dr Lavera Guise Internal medicine

## 2022-12-13 ENCOUNTER — Telehealth: Payer: Self-pay | Admitting: Physician Assistant

## 2022-12-13 NOTE — Telephone Encounter (Signed)
Lvm to notify patient of echo appt and to schedule f/u-Toni

## 2022-12-13 NOTE — Telephone Encounter (Signed)
Awaiting 12/12/22 office notes for SS order-Toni

## 2022-12-14 ENCOUNTER — Telehealth: Payer: Self-pay | Admitting: Physician Assistant

## 2022-12-14 NOTE — Telephone Encounter (Signed)
Left 2nd vm and sent mychart message to notify her of echo appointment and to schedule follow up-Toni

## 2022-12-16 ENCOUNTER — Telehealth: Payer: Self-pay | Admitting: Physician Assistant

## 2022-12-16 NOTE — Telephone Encounter (Signed)
SS order placed in [facility] Great folder-Toni 

## 2022-12-21 DIAGNOSIS — H00012 Hordeolum externum right lower eyelid: Secondary | ICD-10-CM | POA: Diagnosis not present

## 2023-01-05 ENCOUNTER — Encounter: Payer: Self-pay | Admitting: Physician Assistant

## 2023-01-05 ENCOUNTER — Other Ambulatory Visit: Payer: Self-pay | Admitting: Physician Assistant

## 2023-01-05 DIAGNOSIS — E669 Obesity, unspecified: Secondary | ICD-10-CM

## 2023-01-05 DIAGNOSIS — I10 Essential (primary) hypertension: Secondary | ICD-10-CM

## 2023-01-05 DIAGNOSIS — K760 Fatty (change of) liver, not elsewhere classified: Secondary | ICD-10-CM

## 2023-01-05 MED ORDER — ZEPBOUND 2.5 MG/0.5ML ~~LOC~~ SOAJ: 2.5000 mg | SUBCUTANEOUS | 0 refills | Status: DC

## 2023-01-06 ENCOUNTER — Telehealth: Payer: Self-pay

## 2023-01-06 ENCOUNTER — Other Ambulatory Visit: Payer: Self-pay

## 2023-01-06 DIAGNOSIS — I10 Essential (primary) hypertension: Secondary | ICD-10-CM

## 2023-01-06 DIAGNOSIS — E669 Obesity, unspecified: Secondary | ICD-10-CM

## 2023-01-06 DIAGNOSIS — K760 Fatty (change of) liver, not elsewhere classified: Secondary | ICD-10-CM

## 2023-01-06 NOTE — Telephone Encounter (Signed)
PA was done for Zepbound.

## 2023-01-10 ENCOUNTER — Other Ambulatory Visit: Payer: Self-pay

## 2023-01-10 ENCOUNTER — Ambulatory Visit: Payer: BC Managed Care – PPO

## 2023-01-10 DIAGNOSIS — E669 Obesity, unspecified: Secondary | ICD-10-CM

## 2023-01-10 DIAGNOSIS — K760 Fatty (change of) liver, not elsewhere classified: Secondary | ICD-10-CM

## 2023-01-10 DIAGNOSIS — I10 Essential (primary) hypertension: Secondary | ICD-10-CM

## 2023-01-10 MED ORDER — ZEPBOUND 2.5 MG/0.5ML ~~LOC~~ SOAJ: 2.5000 mg | SUBCUTANEOUS | 0 refills | Status: DC

## 2023-01-16 ENCOUNTER — Inpatient Hospital Stay: Payer: BC Managed Care – PPO

## 2023-01-16 ENCOUNTER — Other Ambulatory Visit: Payer: Self-pay

## 2023-01-16 DIAGNOSIS — E669 Obesity, unspecified: Secondary | ICD-10-CM

## 2023-01-16 DIAGNOSIS — K760 Fatty (change of) liver, not elsewhere classified: Secondary | ICD-10-CM

## 2023-01-16 DIAGNOSIS — I10 Essential (primary) hypertension: Secondary | ICD-10-CM

## 2023-01-16 MED ORDER — ZEPBOUND 2.5 MG/0.5ML ~~LOC~~ SOAJ: 2.5000 mg | SUBCUTANEOUS | 0 refills | Status: DC

## 2023-01-16 NOTE — Telephone Encounter (Signed)
Spoke with pt and sent pres to [facility]

## 2023-01-18 ENCOUNTER — Other Ambulatory Visit: Payer: BC Managed Care – PPO

## 2023-01-18 ENCOUNTER — Inpatient Hospital Stay: Payer: BC Managed Care – PPO

## 2023-01-18 ENCOUNTER — Inpatient Hospital Stay: Payer: BC Managed Care – PPO | Admitting: Oncology

## 2023-01-30 ENCOUNTER — Encounter: Payer: BC Managed Care – PPO | Admitting: Physician Assistant

## 2023-02-01 ENCOUNTER — Ambulatory Visit: Admission: RE | Admit: 2023-02-01 | Payer: BC Managed Care – PPO | Source: Ambulatory Visit

## 2023-02-09 ENCOUNTER — Other Ambulatory Visit: Payer: Self-pay | Admitting: Physician Assistant

## 2023-02-09 DIAGNOSIS — Z1231 Encounter for screening mammogram for malignant neoplasm of breast: Secondary | ICD-10-CM

## 2023-02-24 ENCOUNTER — Other Ambulatory Visit: Payer: Self-pay | Admitting: Physician Assistant

## 2023-02-24 DIAGNOSIS — I10 Essential (primary) hypertension: Secondary | ICD-10-CM

## 2023-02-24 DIAGNOSIS — K760 Fatty (change of) liver, not elsewhere classified: Secondary | ICD-10-CM

## 2023-02-24 DIAGNOSIS — E669 Obesity, unspecified: Secondary | ICD-10-CM

## 2023-03-03 ENCOUNTER — Telehealth: Payer: Self-pay | Admitting: Physician Assistant

## 2023-03-03 NOTE — Telephone Encounter (Signed)
per Ginny Forth w/ [facility], Teresa lvm 3.5.24 to scheduled Home SS, lvm and sent text. I spoke with patient, gave her [facility] telephone #. She will call to schedule appointment-Toni

## 2023-03-06 ENCOUNTER — Ambulatory Visit
Admission: RE | Admit: 2023-03-06 | Discharge: 2023-03-06 | Disposition: A | Discharge status: Home / Self Care | Payer: BC Managed Care – PPO | Source: Ambulatory Visit | Attending: Physician Assistant | Admitting: Physician Assistant

## 2023-03-06 DIAGNOSIS — Z1231 Encounter for screening mammogram for malignant neoplasm of breast: Secondary | ICD-10-CM | POA: Diagnosis not present

## 2023-03-10 ENCOUNTER — Inpatient Hospital Stay: Payer: BC Managed Care – PPO | Attending: Oncology

## 2023-03-13 ENCOUNTER — Encounter: Payer: BC Managed Care – PPO | Admitting: Physician Assistant

## 2023-03-14 ENCOUNTER — Inpatient Hospital Stay: Payer: BC Managed Care – PPO

## 2023-03-14 ENCOUNTER — Inpatient Hospital Stay: Payer: BC Managed Care – PPO | Admitting: Oncology

## 2023-03-14 ENCOUNTER — Telehealth: Payer: BC Managed Care – PPO | Admitting: Physician Assistant

## 2023-03-14 DIAGNOSIS — R3989 Other symptoms and signs involving the genitourinary system: Secondary | ICD-10-CM

## 2023-03-14 MED ORDER — NITROFURANTOIN MONOHYD MACRO 100 MG PO CAPS
100.0000 mg | ORAL_CAPSULE | Freq: Two times a day (BID) | ORAL | 0 refills | Status: DC
Start: 2023-03-14 — End: 2023-06-09

## 2023-03-14 NOTE — Progress Notes (Signed)
E-Visit for Urinary Problems  We are sorry that you are not feeling well.  Here is how we plan to help!  Based on what you shared with me it looks like you most likely have a simple urinary tract infection.  A UTI (Urinary Tract Infection) is a bacterial infection of the bladder.  Most cases of urinary tract infections are simple to treat but a key part of your care is to encourage you to drink plenty of fluids and watch your symptoms carefully.  I have prescribed MacroBid 100 mg twice a day for 5 days.  Your symptoms should gradually improve. Call us if the burning in your urine worsens, you develop worsening fever, back pain or pelvic pain or if your symptoms do not resolve after completing the antibiotic.  Urinary tract infections can be prevented by drinking plenty of water to keep your body hydrated.  Also be sure when you wipe, wipe from front to back and don't hold it in!  If possible, empty your bladder every 4 hours.  HOME CARE Drink plenty of fluids Compete the full course of the antibiotics even if the symptoms resolve Remember, when you need to go.go. Holding in your urine can increase the likelihood of getting a UTI! GET HELP RIGHT AWAY IF: You cannot urinate You get a high fever Worsening back pain occurs You see blood in your urine You feel sick to your stomach or throw up You feel like you are going to pass out  MAKE SURE YOU  Understand these instructions. Will watch your condition. Will get help right away if you are not doing well or get worse.   Thank you for choosing an e-visit.  Your e-visit answers were reviewed by a board certified advanced clinical practitioner to complete your personal care plan. Depending upon the condition, your plan could have included both over the counter or prescription medications.  Please review your pharmacy choice. Make sure the pharmacy is open so you can pick up prescription now. If there is a problem, you may contact your  provider through MyChart messaging and have the prescription routed to another pharmacy.  Your safety is important to us. If you have drug allergies check your prescription carefully.   For the next 24 hours you can use MyChart to ask questions about today's visit, request a non-urgent call back, or ask for a work or school excuse. You will get an email in the next two days asking about your experience. I hope that your e-visit has been valuable and will speed your recovery.  I have spent 5 minutes in review of e-visit questionnaire, review and updating patient chart, medical decision making and response to patient.   Jermie Hippe M Kanani Mowbray, PA-C  

## 2023-03-16 ENCOUNTER — Encounter: Payer: BC Managed Care – PPO | Admitting: Physician Assistant

## 2023-03-20 ENCOUNTER — Telehealth: Payer: BC Managed Care – PPO | Admitting: Nurse Practitioner

## 2023-03-20 DIAGNOSIS — R300 Dysuria: Secondary | ICD-10-CM

## 2023-03-20 NOTE — Progress Notes (Signed)
Because of your ongoing symptoms, I feel your condition warrants further evaluation and I recommend that you be seen in a face to face visit.   When the first line antibiotics are not effective it is best to have a urine sample collected to assure you get the proper treatment at this time   NOTE: There will be NO CHARGE for this eVisit   If you are having a true medical emergency please call 911.      For an urgent face to face visit, [place] has eight urgent care centers for your convenience:   NEW!! [facility] at [facility] Get Driving Directions [phone] [address], [address] [place], [address]    [facility] Urgent Care Center at [facility] Get Driving Directions [phone] [address] [place], [place] [address]   [facility] Health Urgent Care Center [facility]) Get Driving Directions [phone] [address] [place], [place] [address]  [facility] Health Urgent Care Center [facility] [place]) Get Driving Directions [phone] [address] [place],  [place]  [address]  [facility] Center [facility] - at [employer]  [phone] [address] [place],  [place] [address]   [facility] Health Urgent Care at [facility] Get Driving Directions [phone] [address] [place], [place] [address]   [facility] Health Urgent Care at [facility] Get Driving Directions  [phone] [address] [place], [place] [address]   [facility] Health Urgent Care at [facility] Directions [phone] [address], [address] [place], [place] [address]  Your MyChart E-visit questionnaire answers were reviewed by a board certified advanced clinical practitioner to complete your personal care plan based on your specific symptoms.  Thank you for using e-Visits.

## 2023-03-24 ENCOUNTER — Ambulatory Visit (INDEPENDENT_AMBULATORY_CARE_PROVIDER_SITE_OTHER): Payer: BC Managed Care – PPO | Admitting: Physician Assistant

## 2023-03-24 ENCOUNTER — Encounter: Payer: Self-pay | Admitting: Physician Assistant

## 2023-03-24 VITALS — BP 115/82 | HR 95 | Temp 98.2°F | Resp 16 | Ht 65.0 in | Wt 195.8 lb

## 2023-03-24 DIAGNOSIS — Z0001 Encounter for general adult medical examination with abnormal findings: Secondary | ICD-10-CM | POA: Diagnosis not present

## 2023-03-24 DIAGNOSIS — E538 Deficiency of other specified B group vitamins: Secondary | ICD-10-CM

## 2023-03-24 DIAGNOSIS — E669 Obesity, unspecified: Secondary | ICD-10-CM

## 2023-03-24 DIAGNOSIS — I10 Essential (primary) hypertension: Secondary | ICD-10-CM

## 2023-03-24 DIAGNOSIS — E785 Hyperlipidemia, unspecified: Secondary | ICD-10-CM | POA: Diagnosis not present

## 2023-03-24 DIAGNOSIS — M79604 Pain in right leg: Secondary | ICD-10-CM

## 2023-03-24 DIAGNOSIS — R300 Dysuria: Secondary | ICD-10-CM

## 2023-03-24 MED ORDER — CIPROFLOXACIN HCL 500 MG PO TABS
500.0000 mg | ORAL_TABLET | Freq: Two times a day (BID) | ORAL | 0 refills | Status: AC
Start: 2023-03-24 — End: 2023-03-31

## 2023-03-24 MED ORDER — CYANOCOBALAMIN 1000 MCG/ML IJ SOLN
1000.0000 ug | Freq: Once | INTRAMUSCULAR | Status: AC
Start: 2023-03-24 — End: 2023-03-24
  Administered 2023-03-24: 1000 ug via INTRAMUSCULAR

## 2023-03-24 MED ORDER — ZEPBOUND 5 MG/0.5ML ~~LOC~~ SOAJ
5.0000 mg | SUBCUTANEOUS | 2 refills | Status: DC
Start: 2023-03-24 — End: 2023-06-09

## 2023-03-24 NOTE — Progress Notes (Signed)
[facility] [address] [place], [place] [address]  Internal MEDICINE  Office Visit Note  Patient Name: Jennifer Barron  [id number]  [id number]  Date of Service: 03/24/2023  Chief Complaint  Patient presents with   Annual Exam     HPI Pt is here for routine health maintenance examination -Has a UTI currently and was treated once with macrobid vis telehealth, but didn't go away. Will send culture but go ahead and treat -Zepbound started 2 months ago, a little nauseous initially but this subsided and is tolerating. Would like to increase dose -Down 15 lbs since last visit -Sleeping better and overall feeling better -Mammogram done -Some pain along outside of right thigh and gets a little numb, not doing injections in legs--only abdomen. No known injury or swelling. Will try rolling IT band. And may use some ibuprofen and avoid sitting on leg as she often does. Also due for B12 injections. She will notify office if new or worsening symptoms -Stopped bp med bc she felt worse and is doing much better now with her BP   Current Medication: Outpatient Encounter Medications as of 03/24/2023  Medication Sig   cholecalciferol (VITAMIN D) 1000 units tablet Take 1 tablet (1,000 Units total) by mouth daily.   ciprofloxacin (CIPRO) 500 MG tablet Take 1 tablet (500 mg total) by mouth 2 (two) times daily for 7 days.   nitrofurantoin, macrocrystal-monohydrate, (MACROBID) 100 MG capsule Take 1 capsule (100 mg total) by mouth 2 (two) times daily.   spironolactone (ALDACTONE) 50 MG tablet    tirzepatide (ZEPBOUND) 5 MG/0.5ML Pen Inject 5 mg into the skin once a week.   [DISCONTINUED] amLODipine (NORVASC) 10 MG tablet Take 1 tablet (10 mg total) by mouth daily.   [DISCONTINUED] buPROPion (WELLBUTRIN XL) 150 MG 24 hr tablet Take 1 tablet (150 mg total) by mouth daily.   [DISCONTINUED] tirzepatide (ZEPBOUND) 2.5 MG/0.5ML Pen INJECT 2.5 MG SUBCUTANEOUSLY WEEKLY   [EXPIRED]  cyanocobalamin (VITAMIN B12) injection 1,000 mcg    No facility-administered encounter medications on file as of 03/24/2023.    Surgical History: Past Surgical History:  Procedure Laterality Date   abdominal laproscopy     ADENOIDECTOMY     twice   TONSILLECTOMY Bilateral 1987   WISDOM TOOTH EXTRACTION  2009    Medical History: Past Medical History:  Diagnosis Date   Endometriosis    Migraine     Family History: Family History  Problem Relation Age of Onset   Breast cancer Mother    Cancer Father    Ovarian cancer Paternal Grandmother    Cancer Paternal Grandmother    Prostate cancer Paternal Grandfather    Cancer Paternal Grandfather       Review of Systems  Constitutional:  Negative for chills, fatigue and unexpected weight change.  HENT:  Positive for postnasal drip. Negative for congestion, rhinorrhea, sneezing and sore throat.   Eyes:  Negative for redness.  Respiratory:  Negative for cough, chest tightness and shortness of breath.   Cardiovascular:  Negative for chest pain and palpitations.  Gastrointestinal:  Negative for abdominal pain, constipation, diarrhea, nausea and vomiting.  Genitourinary:  Negative for dysuria and frequency.  Musculoskeletal:  Positive for myalgias. Negative for arthralgias, back pain, joint swelling and neck pain.  Skin:  Negative for rash.  Neurological: Negative.  Negative for tremors and numbness.  Hematological:  Negative for adenopathy. Does not bruise/bleed easily.  Psychiatric/Behavioral:  Negative for behavioral problems (Depression), sleep disturbance and suicidal ideas. The patient  is not nervous/anxious.      Vital Signs: BP 115/82   Pulse 95   Temp 98.2 F (36.8 C)   Resp 16   Ht 5\' 5"  (1.651 m)   Wt 195 lb 12.8 oz (88.8 kg)   LMP 01/31/2023   SpO2 95%   BMI 32.58 kg/m    Physical Exam Vitals and nursing note reviewed.  Constitutional:      Appearance: Normal appearance.  HENT:     Head: Normocephalic  and atraumatic.     Nose: Nose normal.     Mouth/Throat:     Mouth: Mucous membranes are moist.     Pharynx: No posterior oropharyngeal erythema.  Eyes:     Extraocular Movements: Extraocular movements intact.     Pupils: Pupils are equal, round, and reactive to light.  Cardiovascular:     Rate and Rhythm: Normal rate and regular rhythm.     Pulses: Normal pulses.     Heart sounds: Normal heart sounds.  Pulmonary:     Effort: Pulmonary effort is normal.     Breath sounds: Normal breath sounds.  Abdominal:     General: Abdomen is flat.     Tenderness: There is no abdominal tenderness.  Musculoskeletal:        General: Normal range of motion.     Cervical back: Normal range of motion.  Skin:    General: Skin is warm and dry.  Neurological:     General: No focal deficit present.     Mental Status: She is alert.  Psychiatric:        Mood and Affect: Mood normal.        Behavior: Behavior normal.        Thought Content: Thought content normal.        Judgment: Judgment normal.      LABS: No results found for this or any previous visit (from the past 2160 hour(s)).      Assessment/Plan: 1. Encounter for general adult medical examination with abnormal findings CPE performed, UTD on PHM  2. Benign hypertension Normal off meds now and will continue to monitor  3. Hyperlipidemia, unspecified hyperlipidemia type Continue to work on diet and exercise  4. B12 deficiency - cyanocobalamin (VITAMIN B12) injection 1,000 mcg  5. Leg pain, lateral, right Will try rolling IT band and stretching and using ibuprofen as needed. Will also be mindful of positioning as she reports sitting on this leg while working at desk or sitting on couch  6. Obesity (BMI 30-39.9) Down 15lbs since last visit, may increase to 5mg  weekly and continue to work on diet and exercise - tirzepatide (ZEPBOUND) 5 MG/0.5ML Pen; Inject 5 mg into the skin once a week.  Dispense: 2 mL; Refill: 2  7.  Dysuria Will treat with cipro and adjust based on C/S - UA/M w/rflx Culture, Routine - ciprofloxacin (CIPRO) 500 MG tablet; Take 1 tablet (500 mg total) by mouth 2 (two) times daily for 7 days.  Dispense: 14 tablet; Refill: 0   General Counseling: Jennifer Barron verbalizes understanding of the findings of todays visit and agrees with plan of treatment. I have discussed any further diagnostic evaluation that may be needed or ordered today. We also reviewed her medications today. she has been encouraged to call the office with any questions or concerns that should arise related to todays visit.    Counseling:    Orders Placed This Encounter  Procedures   UA/M w/rflx Culture, Routine    Meds  ordered this encounter  Medications   tirzepatide (ZEPBOUND) 5 MG/0.5ML Pen    Sig: Inject 5 mg into the skin once a week.    Dispense:  2 mL    Refill:  2   ciprofloxacin (CIPRO) 500 MG tablet    Sig: Take 1 tablet (500 mg total) by mouth 2 (two) times daily for 7 days.    Dispense:  14 tablet    Refill:  0   cyanocobalamin (VITAMIN B12) injection 1,000 mcg    This patient was seen by Lynn Ito, PA-C in collaboration with Dr. Beverely Risen as a part of collaborative care agreement.  Total time spent:35 Minutes  Time spent includes review of chart, medications, test results, and follow up plan with the patient.     Lyndon Code, MD  Internal Medicine

## 2023-03-27 ENCOUNTER — Other Ambulatory Visit: Payer: Self-pay | Admitting: Physician Assistant

## 2023-03-27 ENCOUNTER — Telehealth: Payer: Self-pay

## 2023-03-27 DIAGNOSIS — K760 Fatty (change of) liver, not elsewhere classified: Secondary | ICD-10-CM

## 2023-03-27 DIAGNOSIS — I10 Essential (primary) hypertension: Secondary | ICD-10-CM

## 2023-03-27 DIAGNOSIS — E669 Obesity, unspecified: Secondary | ICD-10-CM

## 2023-03-27 LAB — UA/M W/RFLX CULTURE, ROUTINE

## 2023-03-27 NOTE — Telephone Encounter (Signed)
Spoke with patient regarding urine culture results, will repeat urine if ABX does not help.

## 2023-03-27 NOTE — Telephone Encounter (Signed)
-----   Message from Carlean Jews, PA-C sent at 03/27/2023 12:51 PM EDT ----- Please let her know that the lab was unable to run her urine culture due to sample size. If not improving with ABX I prescribed then may need to repeat her urine.

## 2023-03-30 DIAGNOSIS — D2261 Melanocytic nevi of right upper limb, including shoulder: Secondary | ICD-10-CM | POA: Diagnosis not present

## 2023-03-30 DIAGNOSIS — D225 Melanocytic nevi of trunk: Secondary | ICD-10-CM | POA: Diagnosis not present

## 2023-03-30 DIAGNOSIS — D2262 Melanocytic nevi of left upper limb, including shoulder: Secondary | ICD-10-CM | POA: Diagnosis not present

## 2023-03-30 DIAGNOSIS — D2272 Melanocytic nevi of left lower limb, including hip: Secondary | ICD-10-CM | POA: Diagnosis not present

## 2023-04-03 ENCOUNTER — Inpatient Hospital Stay: Payer: BC Managed Care – PPO

## 2023-04-04 ENCOUNTER — Inpatient Hospital Stay: Payer: BC Managed Care – PPO

## 2023-04-05 ENCOUNTER — Inpatient Hospital Stay: Payer: BC Managed Care – PPO

## 2023-04-05 ENCOUNTER — Inpatient Hospital Stay: Payer: BC Managed Care – PPO | Admitting: Oncology

## 2023-04-06 ENCOUNTER — Inpatient Hospital Stay: Payer: BC Managed Care – PPO

## 2023-04-06 ENCOUNTER — Inpatient Hospital Stay: Payer: BC Managed Care – PPO | Admitting: Oncology

## 2023-04-12 ENCOUNTER — Telehealth: Payer: BC Managed Care – PPO | Admitting: Physician Assistant

## 2023-04-12 DIAGNOSIS — B001 Herpesviral vesicular dermatitis: Secondary | ICD-10-CM

## 2023-04-12 MED ORDER — VALACYCLOVIR HCL 1 G PO TABS
2000.0000 mg | ORAL_TABLET | Freq: Two times a day (BID) | ORAL | 0 refills | Status: AC
Start: 2023-04-12 — End: 2023-04-13

## 2023-04-12 NOTE — Progress Notes (Signed)
I have spent 5 minutes in review of e-visit questionnaire, review and updating patient chart, medical decision making and response to patient.   Kendrick Haapala Cody Carmina Walle, PA-C    

## 2023-04-12 NOTE — Progress Notes (Signed)

## 2023-04-24 ENCOUNTER — Ambulatory Visit: Payer: BC Managed Care – PPO | Admitting: Physician Assistant

## 2023-05-08 ENCOUNTER — Inpatient Hospital Stay: Payer: BC Managed Care – PPO

## 2023-05-09 ENCOUNTER — Inpatient Hospital Stay: Payer: BC Managed Care – PPO | Admitting: Oncology

## 2023-05-09 ENCOUNTER — Inpatient Hospital Stay: Payer: BC Managed Care – PPO

## 2023-05-09 ENCOUNTER — Telehealth: Payer: BC Managed Care – PPO | Admitting: Physician Assistant

## 2023-05-09 DIAGNOSIS — R3989 Other symptoms and signs involving the genitourinary system: Secondary | ICD-10-CM

## 2023-05-09 MED ORDER — CEPHALEXIN 500 MG PO CAPS: 500.0000 mg | ORAL_CAPSULE | Freq: Two times a day (BID) | ORAL | 0 refills | Status: AC

## 2023-05-09 NOTE — Progress Notes (Signed)

## 2023-05-09 NOTE — Progress Notes (Signed)
I have spent 5 minutes in review of e-visit questionnaire, review and updating patient chart, medical decision making and response to patient.   William Cody Martin, PA-C    

## 2023-05-13 ENCOUNTER — Telehealth: Payer: BC Managed Care – PPO | Admitting: Nurse Practitioner

## 2023-05-13 DIAGNOSIS — B001 Herpesviral vesicular dermatitis: Secondary | ICD-10-CM

## 2023-05-13 MED ORDER — VALACYCLOVIR HCL 1 G PO TABS: ORAL_TABLET | ORAL | 0 refills | Status: DC

## 2023-05-13 NOTE — Progress Notes (Signed)
We are sorry that you are not feeling well.  Here is how we plan to help!  Based on what you have shared with me it does look like you have a viral infection.    Most cold sores or fever blisters are small fluid filled blisters around the mouth caused by herpes simplex virus.  The most common strain of the virus causing cold sores is herpes simplex virus 1.  It can be spread by skin contact, sharing eating utensils, or even sharing towels.  Cold sores are contagious to other people until dry. (Approximately 5-7 days).  Wash your hands. You can spread the virus to your eyes through handling your contact lenses after touching the lesions.  Most people experience pain at the sight or tingling sensations in their lips that may begin before the ulcers erupt.  Herpes simplex is treatable but not curable.  It may lie dormant for a long time and then reappear due to stress or prolonged sun exposure.  Many patients have success in treating their cold sores with an over the counter topical called Abreva.  You may apply the cream up to 5 times daily (maximum 10 days) until healing occurs.  If you would like to use an oral antiviral medication to speed the healing of your cold sore, I have sent a prescription to your local pharmacy Valacyclovir 2 gm take one by mouth twice a day for 1 day    HOME CARE:  Wash your hands frequently. Do not pick at or rub the sore. Don't open the blisters. Avoid kissing other people during this time. Avoid sharing drinking glasses, eating utensils, or razors. Do not handle contact lenses unless you have thoroughly washed your hands with soap and warm water! Avoid oral sex during this time.  Herpes from sores on your mouth can spread to your partner's genital area. Avoid contact with anyone who has eczema or a weakened immune system. Cold sores are often triggered by exposure to intense sunlight, use a lip balm containing a sunscreen (SPF 30 or higher).  GET HELP RIGHT AWAY  IF:  Blisters look infected. Blisters occur near or in the eye. Symptoms last longer than 10 days. Your symptoms become worse.  MAKE SURE YOU:  Understand these instructions. Will watch your condition. Will get help right away if you are not doing well or get worse.    Your e-visit answers were reviewed by a board certified advanced clinical practitioner to complete your personal care plan.  Depending upon the condition, your plan could have  Included both over the counter or prescription medications.    Please review your pharmacy choice.  Be sure that the pharmacy you have chosen is open so that you can pick up your prescription now.  If there is a problem you can message your provider in MyChart to have the prescription routed to another pharmacy.    Your safety is important to us.  If you have drug allergies check our prescription carefully.  For the next 24 hours you can use MyChart to ask questions about today's visit, request a non-urgent call back, or ask for a work or school excuse from your e-visit provider.  You will get an email in the next two days asking about your experience.  I hope that your e-visit has been valuable and will speed your recovery.  Mary-Margaret Martin, FNP   5-10 minutes spent reviewing and documenting in chart.  

## 2023-05-16 ENCOUNTER — Inpatient Hospital Stay: Payer: BC Managed Care – PPO

## 2023-05-17 ENCOUNTER — Inpatient Hospital Stay: Payer: BC Managed Care – PPO | Admitting: Oncology

## 2023-05-17 ENCOUNTER — Inpatient Hospital Stay: Payer: BC Managed Care – PPO

## 2023-05-30 DIAGNOSIS — L509 Urticaria, unspecified: Secondary | ICD-10-CM | POA: Diagnosis not present

## 2023-06-02 ENCOUNTER — Inpatient Hospital Stay: Payer: BC Managed Care – PPO | Attending: Oncology

## 2023-06-06 ENCOUNTER — Inpatient Hospital Stay: Payer: BC Managed Care – PPO

## 2023-06-06 ENCOUNTER — Inpatient Hospital Stay: Payer: BC Managed Care – PPO | Admitting: Oncology

## 2023-06-09 ENCOUNTER — Encounter: Payer: Self-pay | Admitting: Physician Assistant

## 2023-06-09 ENCOUNTER — Ambulatory Visit (INDEPENDENT_AMBULATORY_CARE_PROVIDER_SITE_OTHER): Payer: BC Managed Care – PPO | Admitting: Physician Assistant

## 2023-06-09 VITALS — BP 125/70 | HR 96 | Temp 97.6°F | Resp 16 | Ht 65.0 in | Wt 178.0 lb

## 2023-06-09 DIAGNOSIS — E785 Hyperlipidemia, unspecified: Secondary | ICD-10-CM | POA: Diagnosis not present

## 2023-06-09 DIAGNOSIS — N926 Irregular menstruation, unspecified: Secondary | ICD-10-CM

## 2023-06-09 DIAGNOSIS — E538 Deficiency of other specified B group vitamins: Secondary | ICD-10-CM

## 2023-06-09 DIAGNOSIS — E559 Vitamin D deficiency, unspecified: Secondary | ICD-10-CM | POA: Diagnosis not present

## 2023-06-09 DIAGNOSIS — R5383 Other fatigue: Secondary | ICD-10-CM

## 2023-06-09 DIAGNOSIS — E663 Overweight: Secondary | ICD-10-CM | POA: Diagnosis not present

## 2023-06-09 DIAGNOSIS — L659 Nonscarring hair loss, unspecified: Secondary | ICD-10-CM | POA: Diagnosis not present

## 2023-06-09 DIAGNOSIS — R946 Abnormal results of thyroid function studies: Secondary | ICD-10-CM

## 2023-06-09 MED ORDER — ZEPBOUND 5 MG/0.5ML ~~LOC~~ SOAJ: 5.0000 mg | SUBCUTANEOUS | 2 refills | Status: DC

## 2023-06-09 NOTE — Progress Notes (Signed)
[facility] [address] [place], [place] [address]  Internal MEDICINE  Office Visit Note  Patient Name: Jennifer Barron  [id number]  [id number]  Date of Service: 06/09/2023  Chief Complaint  Patient presents with   Follow-up   Weight Loss    HPI Pt is here for routine follow up -has been having some hair loss and dermatologist increased spironolactone -Did have hives last week and went to a walk in and got a round of prednisone, no clear trigger. This has resolved -down another 17 lbs since last visit, does state last few week weight loss has stalled, however was also on prednisone then and likely contributed. Will continue at current dose for another month and reassess -BP well controlled now  Current Medication: Outpatient Encounter Medications as of 06/09/2023  Medication Sig   cholecalciferol (VITAMIN D) 1000 units tablet Take 1 tablet (1,000 Units total) by mouth daily.   spironolactone (ALDACTONE) 50 MG tablet    [DISCONTINUED] tirzepatide (ZEPBOUND) 5 MG/0.5ML Pen Inject 5 mg into the skin once a week.   tirzepatide (ZEPBOUND) 5 MG/0.5ML Pen Inject 5 mg into the skin once a week.   [DISCONTINUED] nitrofurantoin, macrocrystal-monohydrate, (MACROBID) 100 MG capsule Take 1 capsule (100 mg total) by mouth 2 (two) times daily.   [DISCONTINUED] valACYclovir (VALTREX) 1000 MG tablet 2 po bid for 1 day   No facility-administered encounter medications on file as of 06/09/2023.    Surgical History: Past Surgical History:  Procedure Laterality Date   abdominal laproscopy     ADENOIDECTOMY     twice   TONSILLECTOMY Bilateral 1987   WISDOM TOOTH EXTRACTION  2009    Medical History: Past Medical History:  Diagnosis Date   Endometriosis    Migraine     Family History: Family History  Problem Relation Age of Onset   Breast cancer Mother    Cancer Father    Ovarian cancer Paternal Grandmother    Cancer Paternal Grandmother    Prostate cancer Paternal  Grandfather    Cancer Paternal Grandfather     Social History   Socioeconomic History   Marital status: Married    Spouse name: Not on file   Number of children: Not on file   Years of education: Not on file   Highest education level: Not on file  Occupational History   Not on file  Tobacco Use   Smoking status: Never   Smokeless tobacco: Never  Vaping Use   Vaping status: Never Used  Substance and Sexual Activity   Alcohol use: Yes    Comment: social   Drug use: No   Sexual activity: Yes  Other Topics Concern   Not on file  Social History Narrative   Not on file   Social Determinants of Health   Financial Resource Strain: Not on file  Food Insecurity: Not on file  Transportation Needs: Not on file  Physical Activity: Not on file  Stress: Not on file  Social Connections: Not on file  Intimate Partner Violence: Not on file      Review of Systems  Constitutional:  Negative for chills, fatigue and unexpected weight change.  HENT:  Negative for congestion, rhinorrhea, sneezing and sore throat.   Eyes:  Negative for redness.  Respiratory:  Negative for cough, chest tightness and shortness of breath.   Cardiovascular:  Negative for chest pain and palpitations.  Gastrointestinal:  Negative for abdominal pain, constipation, diarrhea, nausea and vomiting.  Genitourinary:  Negative for dysuria and  frequency.  Musculoskeletal:  Negative for arthralgias, back pain, joint swelling and neck pain.  Skin:  Negative for rash.       Hair loss  Neurological: Negative.  Negative for tremors and numbness.  Hematological:  Negative for adenopathy. Does not bruise/bleed easily.  Psychiatric/Behavioral:  Negative for behavioral problems (Depression), sleep disturbance and suicidal ideas. The patient is not nervous/anxious.     Vital Signs: BP 125/70   Pulse 96 Comment: 110  Temp 97.6 F (36.4 C)   Resp 16   Ht 5\' 5"  (1.651 m)   Wt 178 lb (80.7 kg)   SpO2 98%   BMI 29.62  kg/m    Physical Exam Vitals and nursing note reviewed.  Constitutional:      Appearance: Normal appearance.  HENT:     Head: Normocephalic and atraumatic.     Nose: Nose normal.     Mouth/Throat:     Mouth: Mucous membranes are moist.     Pharynx: No posterior oropharyngeal erythema.  Eyes:     Extraocular Movements: Extraocular movements intact.     Pupils: Pupils are equal, round, and reactive to light.  Cardiovascular:     Rate and Rhythm: Normal rate and regular rhythm.     Pulses: Normal pulses.     Heart sounds: Normal heart sounds.  Pulmonary:     Effort: Pulmonary effort is normal.     Breath sounds: Normal breath sounds.  Abdominal:     General: Abdomen is flat.     Tenderness: There is no abdominal tenderness.  Musculoskeletal:        General: Normal range of motion.     Cervical back: Normal range of motion.  Skin:    General: Skin is warm and dry.  Neurological:     General: No focal deficit present.     Mental Status: She is alert.  Psychiatric:        Mood and Affect: Mood normal.        Behavior: Behavior normal.        Thought Content: Thought content normal.        Judgment: Judgment normal.        Assessment/Plan: 1. Overweight (BMI 25.0-29.9) Down another 17lbs since last visit, will continue with zepbound as before  - tirzepatide (ZEPBOUND) 5 MG/0.5ML Pen; Inject 5 mg into the skin once a week.  Dispense: 2 mL; Refill: 2  2. Hair loss Followed by dermatology and is on increased spironolactone. Continue to monitor  3. Hyperlipidemia, unspecified hyperlipidemia type - Lipid Panel With LDL/HDL Ratio  4. Vitamin D deficiency - VITAMIN D 25 Hydroxy (Vit-D Deficiency, Fractures)  5. Abnormal thyroid exam - TSH + free T4  6. Irregular menstrual cycle - Estradiol - FSH/LH  7. B12 deficiency - B12 and Folate Panel  8. Other fatigue - CBC w/Diff/Platelet - Comprehensive metabolic panel - TSH + free T4 - Lipid Panel With LDL/HDL  Ratio - Estradiol - FSH/LH - B12 and Folate Panel - VITAMIN D 25 Hydroxy (Vit-D Deficiency, Fractures)   General Counseling: Jennifer Barron verbalizes understanding of the findings of todays visit and agrees with plan of treatment. I have discussed any further diagnostic evaluation that may be needed or ordered today. We also reviewed her medications today. she has been encouraged to call the office with any questions or concerns that should arise related to todays visit.    Orders Placed This Encounter  Procedures   CBC w/Diff/Platelet   Comprehensive metabolic panel  TSH + free T4   Lipid Panel With LDL/HDL Ratio   Estradiol   FSH/LH   B12 and Folate Panel   VITAMIN D 25 Hydroxy (Vit-D Deficiency, Fractures)    Meds ordered this encounter  Medications   tirzepatide (ZEPBOUND) 5 MG/0.5ML Pen    Sig: Inject 5 mg into the skin once a week.    Dispense:  2 mL    Refill:  2    This patient was seen by Lynn Ito, PA-C in collaboration with Dr. Beverely Risen as a part of collaborative care agreement.   Total time spent:30 Minutes Time spent includes review of chart, medications, test results, and follow up plan with the patient.      Dr Lyndon Code Internal medicine

## 2023-06-15 ENCOUNTER — Telehealth: Payer: Self-pay

## 2023-06-15 ENCOUNTER — Encounter: Payer: Self-pay | Admitting: Physician Assistant

## 2023-06-15 NOTE — Telephone Encounter (Signed)
Completed P.A. for patient's Zepbound. 

## 2023-07-20 ENCOUNTER — Telehealth: Payer: BC Managed Care – PPO | Admitting: Family Medicine

## 2023-07-20 DIAGNOSIS — B001 Herpesviral vesicular dermatitis: Secondary | ICD-10-CM | POA: Diagnosis not present

## 2023-07-21 ENCOUNTER — Inpatient Hospital Stay: Payer: BC Managed Care – PPO | Attending: Oncology

## 2023-07-21 MED ORDER — VALACYCLOVIR HCL 1 G PO TABS: 2000.0000 mg | ORAL_TABLET | Freq: Two times a day (BID) | ORAL | 0 refills | Status: AC

## 2023-07-21 NOTE — Progress Notes (Signed)
We are sorry that you are not feeling well.  Here is how we plan to help!  Based on what you have shared with me it does look like you have a viral infection.    Most cold sores or fever blisters are small fluid filled blisters around the mouth caused by herpes simplex virus.  The most common strain of the virus causing cold sores is herpes simplex virus 1.  It can be spread by skin contact, sharing eating utensils, or even sharing towels.  Cold sores are contagious to other people until dry. (Approximately 5-7 days).  Wash your hands. You can spread the virus to your eyes through handling your contact lenses after touching the lesions.  Most people experience pain at the sight or tingling sensations in their lips that may begin before the ulcers erupt.  Herpes simplex is treatable but not curable.  It may lie dormant for a long time and then reappear due to stress or prolonged sun exposure.  Many patients have success in treating their cold sores with an over the counter topical called Abreva.  You may apply the cream up to 5 times daily (maximum 10 days) until healing occurs.  If you would like to use an oral antiviral medication to speed the healing of your cold sore, I have sent a prescription to your local pharmacy Valacyclovir 2 gm take one by mouth twice a day for 1 day    HOME CARE:  Wash your hands frequently. Do not pick at or rub the sore. Don't open the blisters. Avoid kissing other people during this time. Avoid sharing drinking glasses, eating utensils, or razors. Do not handle contact lenses unless you have thoroughly washed your hands with soap and warm water! Avoid oral sex during this time.  Herpes from sores on your mouth can spread to your partner's genital area. Avoid contact with anyone who has eczema or a weakened immune system. Cold sores are often triggered by exposure to intense sunlight, use a lip balm containing a sunscreen (SPF 30 or higher).  GET HELP RIGHT AWAY  IF:  Blisters look infected. Blisters occur near or in the eye. Symptoms last longer than 10 days. Your symptoms become worse.  MAKE SURE YOU:  Understand these instructions. Will watch your condition. Will get help right away if you are not doing well or get worse.    Your e-visit answers were reviewed by a board certified advanced clinical practitioner to complete your personal care plan.  Depending upon the condition, your plan could have  Included both over the counter or prescription medications.    Please review your pharmacy choice.  Be sure that the pharmacy you have chosen is open so that you can pick up your prescription now.  If there is a problem you can message your provider in [place] to have the prescription routed to another pharmacy.    Your safety is important to [place].  If you have drug allergies check our prescription carefully.  For the next 24 hours you can use MyChart to ask questions about today's visit, request a non-urgent call back, or ask for a work or school excuse from your e-visit provider.  You will get an email in the next two days asking about your experience.  I hope that your e-visit has been valuable and will speed your recovery.    have provided 5 minutes of non face to face time during this encounter for chart review and documentation.

## 2023-07-24 ENCOUNTER — Other Ambulatory Visit: Payer: Self-pay

## 2023-07-24 DIAGNOSIS — D751 Secondary polycythemia: Secondary | ICD-10-CM

## 2023-07-25 ENCOUNTER — Inpatient Hospital Stay: Payer: BC Managed Care – PPO

## 2023-07-25 ENCOUNTER — Inpatient Hospital Stay: Payer: BC Managed Care – PPO | Attending: Oncology | Admitting: Oncology

## 2023-07-25 ENCOUNTER — Encounter: Payer: Self-pay | Admitting: Oncology

## 2023-07-25 ENCOUNTER — Telehealth: Payer: Self-pay | Admitting: Oncology

## 2023-07-25 ENCOUNTER — Other Ambulatory Visit: Payer: Self-pay

## 2023-07-25 VITALS — BP 125/98 | HR 85 | Temp 97.3°F | Resp 18 | Wt 178.8 lb

## 2023-07-25 DIAGNOSIS — R7401 Elevation of levels of liver transaminase levels: Secondary | ICD-10-CM | POA: Insufficient documentation

## 2023-07-25 DIAGNOSIS — Z148 Genetic carrier of other disease: Secondary | ICD-10-CM | POA: Diagnosis not present

## 2023-07-25 DIAGNOSIS — D751 Secondary polycythemia: Secondary | ICD-10-CM | POA: Diagnosis not present

## 2023-07-25 LAB — CBC WITH DIFFERENTIAL (CANCER CENTER ONLY)
Abs Immature Granulocytes: 0.03 10*3/uL (ref 0.00–0.07)
Basophils Absolute: 0 10*3/uL (ref 0.0–0.1)
Basophils Relative: 0 %
Eosinophils Absolute: 0 10*3/uL (ref 0.0–0.5)
Eosinophils Relative: 0 %
HCT: 49.7 % — ABNORMAL HIGH (ref 36.0–46.0)
Hemoglobin: 17.9 g/dL — ABNORMAL HIGH (ref 12.0–15.0)
Immature Granulocytes: 0 %
Lymphocytes Relative: 25 %
Lymphs Abs: 1.7 10*3/uL (ref 0.7–4.0)
MCH: 36.2 pg — ABNORMAL HIGH (ref 26.0–34.0)
MCHC: 36 g/dL (ref 30.0–36.0)
MCV: 100.6 fL — ABNORMAL HIGH (ref 80.0–100.0)
Monocytes Absolute: 0.7 10*3/uL (ref 0.1–1.0)
Monocytes Relative: 10 %
Neutro Abs: 4.5 10*3/uL (ref 1.7–7.7)
Neutrophils Relative %: 65 %
Platelet Count: 286 10*3/uL (ref 150–400)
RBC: 4.94 MIL/uL (ref 3.87–5.11)
RDW: 12.3 % (ref 11.5–15.5)
WBC Count: 7.1 10*3/uL (ref 4.0–10.5)
nRBC: 0 % (ref 0.0–0.2)

## 2023-07-25 LAB — CMP (CANCER CENTER ONLY)
ALT: 152 U/L — ABNORMAL HIGH (ref 0–44)
AST: 130 U/L — ABNORMAL HIGH (ref 15–41)
Albumin: 4 g/dL (ref 3.5–5.0)
Alkaline Phosphatase: 63 U/L (ref 38–126)
Anion gap: 14 (ref 5–15)
BUN: 6 mg/dL (ref 6–20)
CO2: 24 mmol/L (ref 22–32)
Calcium: 9.3 mg/dL (ref 8.9–10.3)
Chloride: 92 mmol/L — ABNORMAL LOW (ref 98–111)
Creatinine: 0.6 mg/dL (ref 0.44–1.00)
GFR, Estimated: >60 mL/min (ref >60)
Glucose, Bld: 108 mg/dL — ABNORMAL HIGH (ref 70–99)
Potassium: 3.2 mmol/L — ABNORMAL LOW (ref 3.5–5.1)
Sodium: 130 mmol/L — ABNORMAL LOW (ref 135–145)
Total Bilirubin: 3.4 mg/dL — ABNORMAL HIGH (ref 0.3–1.2)
Total Protein: 7.1 g/dL (ref 6.5–8.1)

## 2023-07-25 LAB — IRON AND TIBC
Iron: 375 ug/dL — ABNORMAL HIGH (ref 28–170)
Saturation Ratios: 82 % — ABNORMAL HIGH (ref 10.4–31.8)
TIBC: 456 ug/dL — ABNORMAL HIGH (ref 250–450)
UIBC: 81 ug/dL

## 2023-07-25 LAB — FERRITIN: Ferritin: 248 ng/mL (ref 11–307)

## 2023-07-25 NOTE — Telephone Encounter (Signed)
Pt called back and stated she will be here for her appt today

## 2023-07-25 NOTE — Assessment & Plan Note (Signed)
Likely due to fatty liver disease US abdomen RUQ - Increased hepatic parenchymal echogenicity suggestive of steatosis. Avoid alcohol.  LFT is worse.

## 2023-07-25 NOTE — Progress Notes (Signed)
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 Lab Results  Component Value Date   HGB 17.9 (H) 07/25/2023   TIBC 456 (H) 07/25/2023   IRONPCTSAT 82 (H) 07/25/2023   FERRITIN 248 07/25/2023   Ferritin has increased to 248, high iron saturation, she has also worse LFT.  High TIBC can be due to liver damage.  Recommend phebotomy 300cc weekly x 3   Transaminitis Likely due to fatty liver disease US abdomen RUQ - Increased hepatic parenchymal echogenicity suggestive of steatosis. Avoid alcohol.  LFT is worse.   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  Recommend patient to get sleep study done for evaluation of sleep apnea.   Orders Placed This Encounter  Procedures   CMP (Cancer Center only)    Standing Status:   Future    Number of Occurrences:   1    Standing Expiration Date:   07/24/2024   Ferritin    Standing Status:   Future    Standing Expiration Date:   01/23/2024   Iron and TIBC    Standing Status:   Future    Standing Expiration Date:   07/24/2024   CBC with Differential/Platelet    Standing Status:   Future    Standing Expiration Date:   07/24/2024   Comprehensive metabolic panel    Standing Status:   Future    Standing Expiration Date:   07/24/2024   Follow up in 4 months.    All questions were answered. The patient knows to call the clinic with any problems, questions or concerns.  Rickard Patience, MD, PhD [facility] Hematology Oncology 07/25/2023   REASON FOR VISIT Follow up for treatment of  hemochromatosis, elevated iron panel   HISTORY OF PRESENTING ILLNESS:  Jennifer Barron is a  42 y.o.  female present for follow up of hemachromatosis carrier, high iron level Patient follows up with GYN for IUD placement, and was fine to have abnormal liver function test, mainly elevated transaminitis.  Patient went to primary care physician Dr. Welton Flakes And had additional testing.  She was found to have elevated ferritin level at 547, iron saturation 33, ultrasound of abdomen was done on January 12, 2018, which showed upper normal limits spleen size and increased echogenicity of liver questionable fatty liver. Patient was referred to [facility] for further evaluation of elevated ferritin. Currently patient takes spironolactone for acne and this was started by dermatologist a week ago.  She also takes phentermine for obesity, recently started. Patient reports feeling tired fatigue.  Denies any joint pain shortness of breath, lower extremity swelling.  She denies any family history of hemochromatosis.  INTERVAL HISTORY Jennifer Barron is a 42 y.o. female who has above history reviewed by me today presents for follow-up visit for heterozygous hemochromatosis, elevated iron saturation.  She is on zepbound for weight loss.  Drinks alcohol socially   Review of Systems  Constitutional:  Positive for malaise/fatigue. Negative for chills, fever and weight loss.  HENT:  Negative for sore throat.   Eyes:  Negative for redness.  Respiratory:  Negative for cough, shortness of breath and wheezing.   Cardiovascular:  Negative for chest pain, palpitations and leg swelling.  Gastrointestinal:  Negative for abdominal pain, blood in stool, nausea and vomiting.  Genitourinary:  Negative for dysuria.  Musculoskeletal:  Negative for myalgias.  Skin:  Negative for rash.  Neurological:  Negative for dizziness, tingling, tremors and headaches.  Endo/Heme/Allergies:  Does not bruise/bleed easily.   Psychiatric/Behavioral:  Negative for hallucinations.     MEDICAL HISTORY:  Past Medical History:  Diagnosis Date   Endometriosis    Migraine     SURGICAL HISTORY: Past Surgical History:  Procedure Laterality Date   abdominal laproscopy     ADENOIDECTOMY     twice   TONSILLECTOMY Bilateral 1987   WISDOM TOOTH EXTRACTION  2009    SOCIAL HISTORY: Social History   Socioeconomic History   Marital status: Married    Spouse name: Not on file   Number of children: Not on file   Years of education: Not on file   Highest education level: Not on file  Occupational History   Not on file  Tobacco Use   Smoking status: Never   Smokeless tobacco: Never  Vaping Use   Vaping status: Never Used  Substance and Sexual Activity   Alcohol use: Yes    Comment: social   Drug use: No   Sexual activity: Yes  Other Topics Concern   Not on file  Social History Narrative   Not on file   Social Determinants of Health   Financial Resource Strain: Not on file  Food Insecurity: Not on file  Transportation Needs: Not on file  Physical Activity: Not on file  Stress: Not on file  Social Connections: Not on file  Intimate Partner Violence: Not on file    FAMILY HISTORY: Family History  Problem Relation Age of Onset   Breast cancer Mother    Cancer Father    Ovarian cancer Paternal Grandmother    Cancer Paternal Grandmother    Prostate cancer Paternal Grandfather    Cancer Paternal Grandfather     ALLERGIES:  has No Known Allergies.  MEDICATIONS:  Current Outpatient Medications  Medication Sig Dispense Refill   cholecalciferol (VITAMIN D) 1000 units tablet Take 1 tablet (1,000 Units total) by mouth daily. 90 tablet 0   spironolactone (ALDACTONE) 50 MG tablet   4   tirzepatide (ZEPBOUND) 5 MG/0.5ML Pen Inject 5 mg into the skin once a week. 2 mL 2   No current facility-administered medications for this visit.     PHYSICAL EXAMINATION: ECOG PERFORMANCE STATUS: 1 -  Symptomatic but completely ambulatory Vitals:   07/25/23 1338 07/25/23 1347  BP: (!) 135/100 (!) 125/98  Pulse: 85   Resp: 18   Temp: (!) 97.3 F (36.3 C)   SpO2: 100%    Filed Weights   07/25/23 1338  Weight: 178 lb 12.8 oz (81.1 kg)     Physical Exam Constitutional:      General: She is not in acute distress.    Appearance: She is obese.  HENT:     Head: Normocephalic and atraumatic.  Eyes:     General: No scleral icterus. Cardiovascular:     Rate and Rhythm: Normal rate and regular rhythm.  Pulmonary:     Effort: Pulmonary effort is normal. No respiratory distress.     Breath sounds: Normal breath sounds.  Abdominal:     General: Bowel sounds are normal. There is no distension.     Palpations: Abdomen is soft.  Musculoskeletal:  General: No deformity. Normal range of motion.     Cervical back: Normal range of motion and neck supple.  Lymphadenopathy:     Cervical: No cervical adenopathy.  Skin:    General: Skin is warm and dry.     Findings: No erythema or rash.  Neurological:     Mental Status: She is alert and oriented to person, place, and time. Mental status is at baseline.     Cranial Nerves: No cranial nerve deficit.  Psychiatric:        Mood and Affect: Mood normal.      LABORATORY DATA:  I have reviewed the data as listed    Latest Ref Rng & Units 07/25/2023    1:30 PM 07/19/2022    8:27 AM 07/07/2022    9:37 AM  CBC  WBC 4.0 - 10.5 K/uL 7.1  5.9  5.9   Hemoglobin 12.0 - 15.0 g/dL 40.9  81.1  91.4   Hematocrit 36.0 - 46.0 % 49.7  44.4  45.0   Platelets 150 - 400 K/uL 286  300  270       Latest Ref Rng & Units 07/25/2023    1:30 PM 07/19/2022    8:27 AM 07/07/2022    9:37 AM  CMP  Glucose 70 - 99 mg/dL 782  956  88   BUN 6 - 20 mg/dL 6  10  10    Creatinine 0.44 - 1.00 mg/dL 2.13  0.86  5.78   Sodium 135 - 145 mmol/L 130  134  138   Potassium 3.5 - 5.1 mmol/L 3.2  4.8  4.7   Chloride 98 - 111 mmol/L 92  100  100   CO2 22 - 32 mmol/L  24  28  25    Calcium 8.9 - 10.3 mg/dL 9.3  9.4  46.9   Total Protein 6.5 - 8.1 g/dL 7.1  7.6  7.0   Total Bilirubin 0.3 - 1.2 mg/dL 3.4  1.2  1.3   Alkaline Phos 38 - 126 U/L 63  56  55   AST 15 - 41 U/L 130  68  132   ALT 0 - 44 U/L 152  101  108     Lab Results  Component Value Date   IRON 375 (H) 07/25/2023   TIBC 456 (H) 07/25/2023   FERRITIN 248 07/25/2023   Hepatitis B surface antigen negative, hepatitis C RNA nondetectable.  01/12/2018 US abdomen showed upper normal limits spleen size and increased echogenicity of liver questionable fatty liver.

## 2023-07-25 NOTE — Telephone Encounter (Signed)
Pt called to r/s her appt. Due to the amount of times that this appt has already been rescheduled there was a sticky note that stated we needed to speak with provider if pt calls to r/s again   These are all of the r/s dates:  r/s from 8/13-AE r/s from 7/24-AE r/s from 7/16-AE r/s from 6/13-AE r/s from 5/21- BC  pt r/s from 3/27 -BC      Please advise  Thank you

## 2023-07-25 NOTE — Assessment & Plan Note (Signed)
Normal epo and carbo monoxide level, negative Jak 2 V617F mutation reflex to other mutations, negaitve BCR ABL1 FISH Less likely primary erythrocytosis.  Recommend patient to get sleep study done for evaluation of sleep apnea.

## 2023-07-25 NOTE — Assessment & Plan Note (Addendum)
Heterozygous H63D hemochromatosis/abnormal iron labs/transaminitis Labs are reviewed and discussed with patient. Lab Results  Component Value Date   HGB 17.9 (H) 07/25/2023   TIBC 456 (H) 07/25/2023   IRONPCTSAT 82 (H) 07/25/2023   FERRITIN 248 07/25/2023   Ferritin has increased to 248, high iron saturation, she has also worse LFT.  High TIBC can be due to liver damage.  Recommend phebotomy 300cc weekly x 3

## 2023-07-26 ENCOUNTER — Telehealth: Payer: Self-pay

## 2023-07-26 DIAGNOSIS — R7401 Elevation of levels of liver transaminase levels: Secondary | ICD-10-CM

## 2023-07-26 NOTE — Telephone Encounter (Signed)
-----   Message from Rickard Patience sent at 07/25/2023  9:24 PM EDT ----- Please let patient know that I recommend phlebotomy weekly x 3. Her transaminitis levels are worse.  Recommendation to get GI evaluation.  Refer to [facility] Please also adjust her next appointment to be 4 months lab prior to MD +/- phlebotomy.  Thank you

## 2023-07-26 NOTE — Telephone Encounter (Signed)
Spoke to pt and informed her of MD recommendation. Pt verbalized understanding. Referral placed to [facility].   Please contact pt to set up appts:   [place] weekly x3 Adjust follow up to be in 4 months :labs prior to MD/phleb

## 2023-07-28 ENCOUNTER — Ambulatory Visit (INDEPENDENT_AMBULATORY_CARE_PROVIDER_SITE_OTHER): Payer: BC Managed Care – PPO | Admitting: Physician Assistant

## 2023-07-28 ENCOUNTER — Encounter: Payer: Self-pay | Admitting: Physician Assistant

## 2023-07-28 VITALS — BP 110/78 | HR 96 | Temp 97.8°F | Resp 16 | Ht 65.0 in | Wt 173.6 lb

## 2023-07-28 DIAGNOSIS — Z23 Encounter for immunization: Secondary | ICD-10-CM

## 2023-07-28 DIAGNOSIS — I10 Essential (primary) hypertension: Secondary | ICD-10-CM

## 2023-07-28 DIAGNOSIS — E663 Overweight: Secondary | ICD-10-CM

## 2023-07-28 NOTE — Progress Notes (Signed)
[facility] [address] [place], [place] [address]  Internal MEDICINE  Office Visit Note  Patient Name: Jennifer Barron  [id number]  [id number]  Date of Service: 08/01/2023  Chief Complaint  Patient presents with   Follow-up    Weight management     HPI Pt is here for routine follow up -Doing phebotomy for the next 3 weeks due to high iron/ferritin and elevated LFTS. Following with hematology -BP sometimes elevated in PM and takes 1/2 tab of amlodipine (5mg  total) thinks this may be some stress -Down 5 lbs since last visit, does well with it. A little nauseous day of but then is fine. Did have a little gap in medication due to new auth requirement, but is doing well back on it and will continue with current dose for now.  Current Medication: Outpatient Encounter Medications as of 07/28/2023  Medication Sig   amLODipine (NORVASC) 5 MG tablet Take 5 mg by mouth daily.   cholecalciferol (VITAMIN D) 1000 units tablet Take 1 tablet (1,000 Units total) by mouth daily.   spironolactone (ALDACTONE) 50 MG tablet    tirzepatide (ZEPBOUND) 5 MG/0.5ML Pen Inject 5 mg into the skin once a week.   No facility-administered encounter medications on file as of 07/28/2023.    Surgical History: Past Surgical History:  Procedure Laterality Date   abdominal laproscopy     ADENOIDECTOMY     twice   TONSILLECTOMY Bilateral 1987   WISDOM TOOTH EXTRACTION  2009    Medical History: Past Medical History:  Diagnosis Date   Endometriosis    Migraine     Family History: Family History  Problem Relation Age of Onset   Breast cancer Mother    Cancer Father    Ovarian cancer Paternal Grandmother    Cancer Paternal Grandmother    Prostate cancer Paternal Grandfather    Cancer Paternal Grandfather     Social History   Socioeconomic History   Marital status: Married    Spouse name: Not on file   Number of children: Not on file   Years of education: Not on file   Highest  education level: Not on file  Occupational History   Not on file  Tobacco Use   Smoking status: Never   Smokeless tobacco: Never  Vaping Use   Vaping status: Never Used  Substance and Sexual Activity   Alcohol use: Yes    Comment: social   Drug use: No   Sexual activity: Yes  Other Topics Concern   Not on file  Social History Narrative   Not on file   Social Determinants of Health   Financial Resource Strain: Not on file  Food Insecurity: Not on file  Transportation Needs: Not on file  Physical Activity: Not on file  Stress: Not on file  Social Connections: Not on file  Intimate Partner Violence: Not on file      Review of Systems  Constitutional:  Negative for chills, fatigue and unexpected weight change.  HENT:  Negative for congestion, postnasal drip, rhinorrhea, sneezing and sore throat.   Eyes:  Negative for redness.  Respiratory:  Negative for cough, chest tightness and shortness of breath.   Cardiovascular:  Negative for chest pain and palpitations.  Gastrointestinal:  Negative for abdominal pain, constipation, diarrhea, nausea and vomiting.  Genitourinary:  Negative for dysuria and frequency.  Musculoskeletal:  Negative for arthralgias, back pain, joint swelling and neck pain.  Skin:  Negative for rash.  Neurological:  Negative for  tremors and numbness.  Hematological:  Negative for adenopathy. Does not bruise/bleed easily.  Psychiatric/Behavioral:  Negative for behavioral problems (Depression) and suicidal ideas. The patient is not nervous/anxious.     Vital Signs: BP 110/78   Pulse 96   Temp 97.8 F (36.6 C)   Resp 16   Ht 5\' 5"  (1.651 m)   Wt 173 lb 9.6 oz (78.7 kg)   SpO2 96%   BMI 28.89 kg/m    Physical Exam Vitals and nursing note reviewed.  Constitutional:      Appearance: Normal appearance.  HENT:     Head: Normocephalic and atraumatic.     Nose: Nose normal.     Mouth/Throat:     Mouth: Mucous membranes are moist.     Pharynx: No  posterior oropharyngeal erythema.  Eyes:     Extraocular Movements: Extraocular movements intact.     Pupils: Pupils are equal, round, and reactive to light.  Cardiovascular:     Rate and Rhythm: Normal rate and regular rhythm.     Pulses: Normal pulses.     Heart sounds: Normal heart sounds.  Pulmonary:     Effort: Pulmonary effort is normal.     Breath sounds: Normal breath sounds.  Abdominal:     General: Abdomen is flat.  Musculoskeletal:        General: Normal range of motion.     Cervical back: Normal range of motion.  Skin:    General: Skin is warm and dry.  Neurological:     General: No focal deficit present.     Mental Status: She is alert.  Psychiatric:        Mood and Affect: Mood normal.        Behavior: Behavior normal.        Thought Content: Thought content normal.        Judgment: Judgment normal.        Assessment/Plan: 1. Overweight (BMI 25.0-29.9) Down another 5lbs, will continue on zepbound and working on diet and exercise. Further wt loss may help with LFTs as well  2. Benign hypertension Overall well controlled, does take 5mg  amlodipine prn if BP high secondary to job stress in afternoons.  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 Needs flu shot - Influenza, MDCK, trivalent, PF(Flucelvax egg-free)   General Counseling: Kenyatte verbalizes understanding of the findings of todays visit and agrees with plan of treatment. I have discussed any further diagnostic evaluation that may be needed or ordered today. We also reviewed her medications today. she has been encouraged to call the office with any questions or concerns that should arise related to todays visit.    Orders Placed This Encounter  Procedures   Influenza, MDCK, trivalent, PF(Flucelvax egg-free)    No orders of the defined types were placed in this encounter.   This patient was seen by Lynn Ito, PA-C in collaboration with Dr.  Beverely Risen as a part of collaborative care agreement.   Total time spent:30 Minutes Time spent includes review of chart, medications, test results, and follow up plan with the patient.      Dr Lyndon Code Internal medicine

## 2023-08-02 ENCOUNTER — Inpatient Hospital Stay: Payer: BC Managed Care – PPO

## 2023-08-02 DIAGNOSIS — D751 Secondary polycythemia: Secondary | ICD-10-CM | POA: Diagnosis not present

## 2023-08-02 DIAGNOSIS — Z148 Genetic carrier of other disease: Secondary | ICD-10-CM

## 2023-08-02 DIAGNOSIS — R7401 Elevation of levels of liver transaminase levels: Secondary | ICD-10-CM | POA: Diagnosis not present

## 2023-08-02 NOTE — Patient Instructions (Signed)

## 2023-08-07 ENCOUNTER — Encounter: Payer: Self-pay | Admitting: Physician Assistant

## 2023-08-07 ENCOUNTER — Other Ambulatory Visit: Payer: Self-pay | Admitting: Physician Assistant

## 2023-08-07 MED ORDER — AMLODIPINE BESYLATE 5 MG PO TABS: 5.0000 mg | ORAL_TABLET | Freq: Every day | ORAL | 2 refills | Status: DC

## 2023-08-09 ENCOUNTER — Inpatient Hospital Stay: Payer: BC Managed Care – PPO

## 2023-08-09 DIAGNOSIS — D751 Secondary polycythemia: Secondary | ICD-10-CM | POA: Diagnosis not present

## 2023-08-09 DIAGNOSIS — Z148 Genetic carrier of other disease: Secondary | ICD-10-CM

## 2023-08-09 DIAGNOSIS — R7401 Elevation of levels of liver transaminase levels: Secondary | ICD-10-CM | POA: Diagnosis not present

## 2023-08-09 NOTE — Progress Notes (Signed)
Per Dr. Cathie Hoops - proceed with phlebotomy today -  Ferritin has increased to 248, high iron saturation, she has also worse LFT.  High TIBC can be due to liver damage.  Recommend phebotomy 300cc weekly x 3

## 2023-08-16 ENCOUNTER — Inpatient Hospital Stay: Payer: BC Managed Care – PPO

## 2023-08-16 DIAGNOSIS — D751 Secondary polycythemia: Secondary | ICD-10-CM | POA: Diagnosis not present

## 2023-08-16 DIAGNOSIS — Z148 Genetic carrier of other disease: Secondary | ICD-10-CM

## 2023-08-16 DIAGNOSIS — R7401 Elevation of levels of liver transaminase levels: Secondary | ICD-10-CM | POA: Diagnosis not present

## 2023-08-16 NOTE — Patient Instructions (Signed)

## 2023-08-25 ENCOUNTER — Encounter: Payer: Self-pay | Admitting: Physician Assistant

## 2023-08-25 ENCOUNTER — Ambulatory Visit (INDEPENDENT_AMBULATORY_CARE_PROVIDER_SITE_OTHER): Payer: BC Managed Care – PPO | Admitting: Physician Assistant

## 2023-08-25 VITALS — BP 126/86 | HR 94 | Temp 98.4°F | Resp 16 | Ht 65.0 in | Wt 169.2 lb

## 2023-08-25 DIAGNOSIS — I10 Essential (primary) hypertension: Secondary | ICD-10-CM | POA: Diagnosis not present

## 2023-08-25 DIAGNOSIS — E663 Overweight: Secondary | ICD-10-CM | POA: Diagnosis not present

## 2023-08-25 MED ORDER — ZEPBOUND 5 MG/0.5ML ~~LOC~~ SOAJ: 5.0000 mg | SUBCUTANEOUS | 2 refills | Status: DC

## 2023-08-25 NOTE — Progress Notes (Signed)
Jennifer Barron [address] [place], [place] [address]  Internal MEDICINE  Office Visit Note  Patient Name: Jennifer Barron  [id number]  [id number]  Date of Service: 08/30/2023  Chief Complaint  Patient presents with   Follow-up    HPI Pt is here for routine follow up for wt loss -Down another 4lbs since last visit, doing well with zepbound -having some more reflux recently and taking some pepcid when needed. Could be due to zepbound as well as stress. Manageable -More allergies recently, taking zyrtec and flonase, will start mucinex -Taking amlodipine around midday and BP has been good -feeling better since phlebotomy, follows back with Jennifer Barron in Feb -Sees Jennifer Barron in Dec for follow up on liver  Current Medication: Outpatient Encounter Medications as of 08/25/2023  Medication Sig   amLODipine (NORVASC) 5 MG tablet Take 1 tablet (5 mg total) by mouth daily.   cholecalciferol (VITAMIN D) 1000 units tablet Take 1 tablet (1,000 Units total) by mouth daily.   spironolactone (ALDACTONE) 50 MG tablet    [DISCONTINUED] tirzepatide (ZEPBOUND) 5 MG/0.5ML Pen Inject 5 mg into the skin once a week.   tirzepatide (ZEPBOUND) 5 MG/0.5ML Pen Inject 5 mg into the skin once a week.   No facility-administered encounter medications on file as of 08/25/2023.    Surgical History: Past Surgical History:  Procedure Laterality Date   abdominal laproscopy     ADENOIDECTOMY     twice   TONSILLECTOMY Bilateral 1987   WISDOM TOOTH EXTRACTION  2009    Medical History: Past Medical History:  Diagnosis Date   Endometriosis    Migraine     Family History: Family History  Problem Relation Age of Onset   Breast cancer Mother    Cancer Father    Ovarian cancer Paternal Grandmother    Cancer Paternal Grandmother    Prostate cancer Paternal Grandfather    Cancer Paternal Grandfather     Social History   Socioeconomic History   Marital status: Married    Spouse name: Not on  file   Number of children: Not on file   Years of education: Not on file   Highest education level: Not on file  Occupational History   Not on file  Tobacco Use   Smoking status: Never   Smokeless tobacco: Never  Vaping Use   Vaping status: Never Used  Substance and Sexual Activity   Alcohol use: Yes    Comment: social   Drug use: No   Sexual activity: Yes  Other Topics Concern   Not on file  Social History Narrative   Not on file   Social Determinants of Health   Financial Resource Strain: Not on file  Food Insecurity: Not on file  Transportation Needs: Not on file  Physical Activity: Not on file  Stress: Not on file  Social Connections: Not on file  Intimate Partner Violence: Not on file      Review of Systems  Constitutional:  Negative for chills, fatigue and unexpected weight change.  HENT:  Negative for congestion, postnasal drip, rhinorrhea, sneezing and sore throat.   Eyes:  Negative for redness.  Respiratory:  Negative for cough, chest tightness and shortness of breath.   Cardiovascular:  Negative for chest pain and palpitations.  Gastrointestinal:  Negative for abdominal pain, constipation, diarrhea and vomiting.       Reflux  Genitourinary:  Negative for dysuria and frequency.  Musculoskeletal:  Negative for arthralgias, back pain, joint swelling and neck  pain.  Skin:  Negative for rash.  Neurological:  Negative for tremors and numbness.  Hematological:  Negative for adenopathy. Does not bruise/bleed easily.  Psychiatric/Behavioral:  Negative for behavioral problems (Depression) and suicidal ideas. The patient is not nervous/anxious.     Vital Signs: BP 126/86 Comment: 130/90  Pulse 94   Temp 98.4 F (36.9 C)   Resp 16   Ht 5\' 5"  (1.651 m)   Wt 169 lb 3.2 oz (76.7 kg)   SpO2 98%   BMI 28.16 kg/m    Physical Exam Vitals and nursing note reviewed.  Constitutional:      Appearance: Normal appearance.  HENT:     Head: Normocephalic and  atraumatic.     Nose: Nose normal.     Mouth/Throat:     Mouth: Mucous membranes are moist.     Pharynx: No posterior oropharyngeal erythema.  Eyes:     Extraocular Movements: Extraocular movements intact.     Pupils: Pupils are equal, round, and reactive to light.  Cardiovascular:     Rate and Rhythm: Normal rate and regular rhythm.     Pulses: Normal pulses.     Heart sounds: Normal heart sounds.  Pulmonary:     Effort: Pulmonary effort is normal.     Breath sounds: Normal breath sounds.  Abdominal:     General: Abdomen is flat.  Musculoskeletal:        General: Normal range of motion.     Cervical back: Normal range of motion.  Skin:    General: Skin is warm and dry.  Neurological:     General: No focal deficit present.     Mental Status: She is alert.  Psychiatric:        Mood and Affect: Mood normal.        Behavior: Behavior normal.        Thought Content: Thought content normal.        Judgment: Judgment normal.        Assessment/Plan: 1. Benign hypertension Stable, continue current medication  2. Overweight (BMI 25.0-29.9) May continue zepbound and working on diet and exercise - tirzepatide (ZEPBOUND) 5 MG/0.5ML Pen; Inject 5 mg into the skin once a week.  Dispense: 2 mL; Refill: 2  3. Hereditary hemochromatosis (HCC) Followed by hematology   General Counseling: Jennifer Barron verbalizes understanding of the findings of todays visit and agrees with plan of treatment. I have discussed any further diagnostic evaluation that may be needed or ordered today. We also reviewed her medications today. she has been encouraged to call the office with any questions or concerns that should arise related to todays visit.    No orders of the defined types were placed in this encounter.   Meds ordered this encounter  Medications   tirzepatide (ZEPBOUND) 5 MG/0.5ML Pen    Sig: Inject 5 mg into the skin once a week.    Dispense:  2 mL    Refill:  2    This patient was  seen by Jennifer Ito, PA-C in collaboration with Jennifer Barron as a part of collaborative care agreement.   Total time spent:30 Minutes Time spent includes review of chart, medications, test results, and follow up plan with the patient.      Dr Jennifer Barron Internal medicine

## 2023-08-29 ENCOUNTER — Encounter: Payer: Self-pay | Admitting: Physician Assistant

## 2023-08-29 ENCOUNTER — Telehealth: Payer: BC Managed Care – PPO | Admitting: Physician Assistant

## 2023-08-29 DIAGNOSIS — B001 Herpesviral vesicular dermatitis: Secondary | ICD-10-CM

## 2023-08-29 MED ORDER — VALACYCLOVIR HCL 1 G PO TABS
2000.0000 mg | ORAL_TABLET | Freq: Two times a day (BID) | ORAL | 0 refills | Status: AC
Start: 2023-08-29 — End: 2023-08-30

## 2023-08-29 NOTE — Progress Notes (Signed)

## 2023-09-19 ENCOUNTER — Telehealth: Payer: BC Managed Care – PPO | Admitting: Physician Assistant

## 2023-09-19 DIAGNOSIS — R3989 Other symptoms and signs involving the genitourinary system: Secondary | ICD-10-CM | POA: Diagnosis not present

## 2023-09-19 MED ORDER — CEPHALEXIN 500 MG PO CAPS: 500.0000 mg | ORAL_CAPSULE | Freq: Two times a day (BID) | ORAL | 0 refills | Status: AC

## 2023-09-19 NOTE — Progress Notes (Signed)
I have spent 5 minutes in review of e-visit questionnaire, review and updating patient chart, medical decision making and response to patient.   Mia Milan Cody Jacklynn Dehaas, PA-C    

## 2023-09-19 NOTE — Progress Notes (Signed)

## 2023-09-27 ENCOUNTER — Other Ambulatory Visit: Payer: Self-pay

## 2023-09-27 ENCOUNTER — Encounter: Payer: Self-pay | Admitting: Oncology

## 2023-09-27 ENCOUNTER — Encounter: Payer: Self-pay | Admitting: Gastroenterology

## 2023-09-27 ENCOUNTER — Ambulatory Visit (INDEPENDENT_AMBULATORY_CARE_PROVIDER_SITE_OTHER): Payer: BC Managed Care – PPO | Admitting: Gastroenterology

## 2023-09-27 VITALS — BP 142/104 | HR 82 | Temp 98.4°F | Ht 65.0 in | Wt 169.2 lb

## 2023-09-27 DIAGNOSIS — K219 Gastro-esophageal reflux disease without esophagitis: Secondary | ICD-10-CM | POA: Diagnosis not present

## 2023-09-27 DIAGNOSIS — R7989 Other specified abnormal findings of blood chemistry: Secondary | ICD-10-CM | POA: Diagnosis not present

## 2023-09-27 DIAGNOSIS — K760 Fatty (change of) liver, not elsewhere classified: Secondary | ICD-10-CM

## 2023-09-27 NOTE — Progress Notes (Signed)
Jennifer Repress, MD [address]  [address]  [place], [place] [address]  Main: [phone]  Fax: [phone]    Gastroenterology Consultation  Referring Provider:     Carlean Jews, PA* Primary Care Physician:  Carlean Jews, PA-C Primary Gastroenterologist:  Dr. Arlyss Barron Reason for Consultation:    Elevated LFTs, iron overload, fatty liver, chronic GERD        HPI:   Jennifer Barron is a 42 y.o. female referred by Dr. Carlean Jews, PA-C  for consultation & management of chronically elevated elevated LFTs, iron overload. She was found to have elevated transaminases, to 4 times upper limit of normal in 11/2017 as part of routine physical by her OB/GYN. Later she had 2 sets of LFTs that were elevated on 2 separate occasions. She had an ultrasound abdomen on 01/12/2018 which revealed hepatic steatosis only and biliary sludge. Further workup revealed elevated ferritin 547, hemoglobin 15.8, heterozygous for H63D mutation. Hepatitis B surface antigen negative, HCV RNA negative. She was seen by hematology and underwent serial phlebotomies, which resulted in improvement of her ferritin as well as hemoglobin. She is referred here for further management.  Patient did not see me since 2019.  She is currently on Zepbound for weight loss and lost about 20 pounds since April 2024.  Her main concern today is severe heartburn with regurgitation, tightness in her chest.  This has been ongoing for more than 3 months.  She has tried over-the-counter antacids with minimal relief.  She also reports a sensation of incomplete emptying, abdominal bloating.  She has been exercising regularly.  She is trying to eat healthy.   She was followed by hematologist, Dr. Cathie Hoops in 07/2023, underwent weekly phlebotomies x 3  NSAIDs: none  Antiplts/Anticoagulants/Anti thrombotics: None  GI Procedures:  none  Past Medical History:  Diagnosis Date   Endometriosis    Migraine     Past  Surgical History:  Procedure Laterality Date   abdominal laproscopy     ADENOIDECTOMY     twice   TONSILLECTOMY Bilateral 1987   WISDOM TOOTH EXTRACTION  2009    Current Outpatient Medications:    amLODipine (NORVASC) 5 MG tablet, Take 1 tablet (5 mg total) by mouth daily., Disp: 30 tablet, Rfl: 2   cholecalciferol (VITAMIN D) 1000 units tablet, Take 1 tablet (1,000 Units total) by mouth daily., Disp: 90 tablet, Rfl: 0   spironolactone (ALDACTONE) 50 MG tablet, , Disp: , Rfl: 4   tirzepatide (ZEPBOUND) 5 MG/0.5ML Pen, Inject 5 mg into the skin once a week., Disp: 2 mL, Rfl: 2    Family History  Problem Relation Age of Onset   Breast cancer Mother    Cancer Father    Ovarian cancer Paternal Grandmother    Cancer Paternal Grandmother    Prostate cancer Paternal Grandfather    Cancer Paternal Grandfather      Social History   Tobacco Use   Smoking status: Never   Smokeless tobacco: Never  Vaping Use   Vaping status: Never Used  Substance Use Topics   Alcohol use: Yes    Comment: social   Drug use: No    Allergies as of 09/27/2023   (No Known Allergies)    Review of Systems:    All systems reviewed and negative except where noted in HPI.   Physical Exam:  BP (!) 142/104 (BP Location: Left Arm, Patient Position: Sitting, Cuff Size: Normal)   Pulse 82  Temp 98.4 F (36.9 C) (Oral)   Ht 5\' 5"  (1.651 m)   Wt 169 lb 4 oz (76.8 kg)   BMI 28.16 kg/m  No LMP recorded.  General:   Alert,  Well-developed, well-nourished, pleasant and cooperative in NAD Head:  Normocephalic and atraumatic. Eyes:  Sclera clear, no icterus.   Conjunctiva pink. Ears:  Normal auditory acuity. Nose:  No deformity, discharge, or lesions. Mouth:  No deformity or lesions,oropharynx pink & moist. Neck:  Supple; no masses or thyromegaly. Lungs:  Respirations even and unlabored.  Clear throughout to auscultation.   No wheezes, crackles, or rhonchi. No acute distress. Heart:  Regular rate and  rhythm; no murmurs, clicks, rubs, or gallops. Abdomen:  Normal bowel sounds. Soft, non-tender and non-distended without masses, hepatosplenomegaly or hernias noted.  No guarding or rebound tenderness.   Rectal: Not performed Msk:  Symmetrical without gross deformities. Good, equal movement & strength bilaterally. Pulses:  Normal pulses noted. Extremities:  No clubbing or edema.  No cyanosis. Neurologic:  Alert and oriented x3;  grossly normal neurologically. Skin:  Intact without significant lesions or rashes. No jaundice. Lymph Nodes:  No significant cervical adenopathy. Psych:  Alert and cooperative. Normal mood and affect.  Imaging Studies: reviewed  Assessment and Plan:   [name] is a 42 y.o. 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  Recommend repeat right upper quadrant ultrasound Check Nash fibrosis panel to assess severity of fibrosis and indication for Rezdiffra Encouraged healthy lifestyle, weight loss, healthy diet and exercise  Chronic GERD with regurgitation and chest tightness Recommend EGD with esophageal biopsies as well as duodenal biopsies to rule out celiac disease given chronically elevated LFTs   Follow up in 6 months   Jennifer Repress, MD

## 2023-09-27 NOTE — Patient Instructions (Addendum)
Your RUQ ultrasound is schedule for 10/03/2023 arrive at 7:45 at out patient imaging for a 8:00am scan . Nothing to eat or drink after midnight.  If you need to reschedule please call [phone] option 3 and then option 2. Address is [address] [place], [place] [address]. Phone number is [phone]

## 2023-09-29 ENCOUNTER — Telehealth: Payer: Self-pay | Admitting: Physician Assistant

## 2023-09-29 ENCOUNTER — Encounter: Payer: Self-pay | Admitting: Physician Assistant

## 2023-09-29 ENCOUNTER — Ambulatory Visit (INDEPENDENT_AMBULATORY_CARE_PROVIDER_SITE_OTHER): Payer: BC Managed Care – PPO | Admitting: Physician Assistant

## 2023-09-29 VITALS — BP 136/82 | HR 94 | Temp 98.4°F | Resp 16 | Ht 65.0 in | Wt 165.0 lb

## 2023-09-29 DIAGNOSIS — E663 Overweight: Secondary | ICD-10-CM | POA: Diagnosis not present

## 2023-09-29 DIAGNOSIS — I10 Essential (primary) hypertension: Secondary | ICD-10-CM | POA: Diagnosis not present

## 2023-09-29 DIAGNOSIS — G4710 Hypersomnia, unspecified: Secondary | ICD-10-CM | POA: Diagnosis not present

## 2023-09-29 LAB — NASH FIBROSURE(R) PLUS
ALPHA 2-MACROGLOBULINS, QN: 232 mg/dL (ref 110–276)
ALT (SGPT) P5P: 107 [IU]/L — ABNORMAL HIGH (ref 0–40)
AST (SGOT) P5P: 152 [IU]/L — ABNORMAL HIGH (ref 0–40)
Apolipoprotein A-1: 125 mg/dL (ref 116–209)
Bilirubin, Total: 1.9 mg/dL — ABNORMAL HIGH (ref 0.0–1.2)
Cholesterol, Total: 213 mg/dL — ABNORMAL HIGH (ref 100–199)
Fibrosis Score: 0.74 — ABNORMAL HIGH (ref 0.00–0.21)
GGT: 194 [IU]/L — ABNORMAL HIGH (ref 0–60)
Glucose: 90 mg/dL (ref 70–99)
Haptoglobin: 60 mg/dL (ref 42–296)
NASH Score: 0.93 — ABNORMAL HIGH (ref 0.00–0.25)
Steatosis Score: 0.72 — ABNORMAL HIGH (ref 0.00–0.40)
Triglycerides: 139 mg/dL (ref 0–149)

## 2023-09-29 NOTE — Telephone Encounter (Signed)
Awaiting 09/29/2023 office notes for SS order-Toni

## 2023-09-29 NOTE — Progress Notes (Signed)
[facility] [address] [place], [place] [address]  Internal MEDICINE  Office Visit Note  Patient Name: Jennifer Barron  [id number]  [id number]  Date of Service: 10/10/2023  Chief Complaint  Patient presents with   Follow-up    HPI Pt is here for routine follow up -PCOS---since she was a teenager, would like to add this to health history as she realized it was not listed recently -Following with GI now, has an [place] and endoscopy. She is a little concerned about this, but is trying not to worry too much at this point. -would like to continue zepbound, down another 4 lbs since last visit and showing steady progress -Pt reports long hx of waking herself up in her sleep. Unsure about snoring. Often wakes groggy but can vary. Does report breathing through mouth at night and has hx of adenoids removal and septum fixed previously. -Never had Sleep study previously ordered and would like to move forward with this now  EPWORTH SLEEPINESS SCALE:  Scale:  (0)= no chance of dozing; (1)= slight chance of dozing; (2)= moderate chance of dozing; (3)= high chance of dozing  Chance  Situtation    Sitting and reading: 1    Watching TV: 2    Sitting Inactive in public: 0    As a passenger in car: 3      Lying down to rest: 3    Sitting and talking: 0    Sitting quielty after lunch: 1    In a car, stopped in traffic: 0   TOTAL SCORE:   10 out of 24   Current Medication: Outpatient Encounter Medications as of 09/29/2023  Medication Sig   amLODipine (NORVASC) 5 MG tablet Take 1 tablet (5 mg total) by mouth daily.   cholecalciferol (VITAMIN D) 1000 units tablet Take 1 tablet (1,000 Units total) by mouth daily.   spironolactone (ALDACTONE) 50 MG tablet    tirzepatide (ZEPBOUND) 5 MG/0.5ML Pen Inject 5 mg into the skin once a week.   No facility-administered encounter medications on file as of 09/29/2023.    Surgical History: Past Surgical History:  Procedure  Laterality Date   abdominal laproscopy     ADENOIDECTOMY     twice   TONSILLECTOMY Bilateral 1987   WISDOM TOOTH EXTRACTION  2009    Medical History: Past Medical History:  Diagnosis Date   Endometriosis    Migraine    PCOS (polycystic ovarian syndrome)     Family History: Family History  Problem Relation Age of Onset   Breast cancer Mother    Cancer Father    Ovarian cancer Paternal Grandmother    Cancer Paternal Grandmother    Prostate cancer Paternal Grandfather    Cancer Paternal Grandfather     Social History   Socioeconomic History   Marital status: Married    Spouse name: Not on file   Number of children: Not on file   Years of education: Not on file   Highest education level: Not on file  Occupational History   Not on file  Tobacco Use   Smoking status: Never   Smokeless tobacco: Never  Vaping Use   Vaping status: Never Used  Substance and Sexual Activity   Alcohol use: Yes    Comment: social   Drug use: No   Sexual activity: Yes  Other Topics Concern   Not on file  Social History Narrative   Not on file   Social Drivers of Corporate investment banker  Strain: Not on file  Food Insecurity: Not on file  Transportation Needs: Not on file  Physical Activity: Not on file  Stress: Not on file  Social Connections: Not on file  Intimate Partner Violence: Not on file      Review of Systems  Constitutional:  Positive for fatigue. Negative for chills and unexpected weight change.  HENT:  Negative for congestion, postnasal drip, rhinorrhea, sneezing and sore throat.   Eyes:  Negative for redness.  Respiratory:  Negative for cough, chest tightness and shortness of breath.   Cardiovascular:  Negative for chest pain and palpitations.  Gastrointestinal:  Negative for abdominal pain, constipation, diarrhea, nausea and vomiting.  Genitourinary:  Negative for dysuria and frequency.  Musculoskeletal:  Negative for arthralgias, back pain, joint swelling and  neck pain.  Skin:  Negative for rash.  Neurological:  Negative for tremors and numbness.  Hematological:  Negative for adenopathy. Does not bruise/bleed easily.  Psychiatric/Behavioral:  Positive for sleep disturbance. Negative for behavioral problems (Depression) and suicidal ideas. The patient is not nervous/anxious.     Vital Signs: BP 136/82 Comment: 145/80  Pulse 94   Temp 98.4 F (36.9 C)   Resp 16   Ht 5\' 5"  (1.651 m)   Wt 165 lb (74.8 kg)   SpO2 95%   BMI 27.46 kg/m    Physical Exam Vitals and nursing note reviewed.  Constitutional:      Appearance: Normal appearance.  HENT:     Head: Normocephalic and atraumatic.     Nose: Nose normal.     Mouth/Throat:     Mouth: Mucous membranes are moist.     Pharynx: No posterior oropharyngeal erythema.  Eyes:     Extraocular Movements: Extraocular movements intact.     Pupils: Pupils are equal, round, and reactive to light.  Cardiovascular:     Rate and Rhythm: Normal rate and regular rhythm.     Pulses: Normal pulses.     Heart sounds: Normal heart sounds.  Pulmonary:     Effort: Pulmonary effort is normal.     Breath sounds: Normal breath sounds.  Abdominal:     General: Abdomen is flat.  Musculoskeletal:        General: Normal range of motion.     Cervical back: Normal range of motion.  Skin:    General: Skin is warm and dry.  Neurological:     General: No focal deficit present.     Mental Status: She is alert.  Psychiatric:        Mood and Affect: Mood normal.        Behavior: Behavior normal.        Thought Content: Thought content normal.        Judgment: Judgment normal.        Assessment/Plan: 1. Benign hypertension Improved on recheck, will continue to monitor. Could increase amlodipine in future if higher readings  2. Hypersomnia (Primary) Due to long hx of fatigue, gasping in sleep, and elevated BMI will refer for PSG for further evaluation - PSG SLEEP STUDY; Future  3. Hereditary  hemochromatosis (HCC) Followed by hematology and also now seeing GI as well  4. Overweight (BMI 25.0-29.9) Down 4lbs since last visit and may continue on zepbound while working on diet and exercise. Will also order PSG - PSG SLEEP STUDY; Future   General Counseling: Jennifer Barron verbalizes understanding of the findings of todays visit and agrees with plan of treatment. I have discussed any further diagnostic evaluation that  may be needed or ordered today. We also reviewed her medications today. she has been encouraged to call the office with any questions or concerns that should arise related to todays visit.    Orders Placed This Encounter  Procedures   PSG SLEEP STUDY    No orders of the defined types were placed in this encounter.   This patient was seen by Lynn Ito, PA-C in collaboration with Dr. Beverely Risen as a part of collaborative care agreement.   Total time spent:30 Minutes Time spent includes review of chart, medications, test results, and follow up plan with the patient.      Dr Lyndon Code Internal medicine

## 2023-10-03 ENCOUNTER — Ambulatory Visit
Admission: RE | Admit: 2023-10-03 | Discharge: 2023-10-03 | Disposition: A | Discharge status: Home / Self Care | Payer: BC Managed Care – PPO | Source: Ambulatory Visit | Attending: Gastroenterology | Admitting: Gastroenterology

## 2023-10-03 ENCOUNTER — Telehealth: Payer: Self-pay

## 2023-10-03 ENCOUNTER — Other Ambulatory Visit: Payer: Self-pay

## 2023-10-03 DIAGNOSIS — R1011 Right upper quadrant pain: Secondary | ICD-10-CM | POA: Diagnosis not present

## 2023-10-03 DIAGNOSIS — R7989 Other specified abnormal findings of blood chemistry: Secondary | ICD-10-CM | POA: Diagnosis not present

## 2023-10-03 DIAGNOSIS — K760 Fatty (change of) liver, not elsewhere classified: Secondary | ICD-10-CM | POA: Diagnosis not present

## 2023-10-03 DIAGNOSIS — K219 Gastro-esophageal reflux disease without esophagitis: Secondary | ICD-10-CM

## 2023-10-03 NOTE — Telephone Encounter (Signed)
Called patient and patient would like to do 11/08/2023. Mailed out new instructions to patient

## 2023-10-03 NOTE — Telephone Encounter (Signed)
Pt left vm needing to reschedule her EGD. Please return call.

## 2023-10-04 ENCOUNTER — Encounter: Admission: RE | Payer: Self-pay | Source: Ambulatory Visit

## 2023-10-04 ENCOUNTER — Ambulatory Visit
Admission: RE | Admit: 2023-10-04 | Payer: BC Managed Care – PPO | Source: Ambulatory Visit | Admitting: Gastroenterology

## 2023-10-04 ENCOUNTER — Telehealth: Payer: Self-pay

## 2023-10-04 SURGERY — ESOPHAGOGASTRODUODENOSCOPY (EGD) WITH PROPOFOL
Anesthesia: General

## 2023-10-04 NOTE — Telephone Encounter (Signed)
Patient verbalized understanding of results she is okay with starting the medication Rezdiffra. She will come today or tomorrow to sign the application form. She made follow up appointment

## 2023-10-04 NOTE — Telephone Encounter (Signed)
-----   Message from [facility] sent at 10/04/2023  2:05 PM EST ----- Please inform patient that she does have severe fatty liver disease in addition to iron overload syndrome.  I informed her oncologist to arrange for phlebotomies sooner.  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

## 2023-10-05 ENCOUNTER — Telehealth: Payer: Self-pay

## 2023-10-05 DIAGNOSIS — K760 Fatty (change of) liver, not elsewhere classified: Secondary | ICD-10-CM

## 2023-10-05 NOTE — Telephone Encounter (Signed)
[employer] denied the medication through insurance they say the request does not met the definition of medical necessity.  The member must have one of the following for review. Historical Biochemical test for Fibrosis: Pro-C3> 14 ng/ml or ELF >9, Vibration controlled transient elastography with a KPA greath then or equal to 8.5 and controlled attenuation parameter greater then or equal to 280 dB.m-1, Liver biopsy in the past 2 years confining steatosis all the following:  A NAFLD activity score greater than or equal to 4 A score of at least 1 in each NAS component ( I.e steatosis  scored 0 to 3) ballooning degeneration ( scored 0 to 2), and lobular inflammation Fibrosis stage 1,2,3   If you want to do a appeal please write letter and I can fax it to [phone] [employer] of [employer] appeals department level I. Write expedited on it

## 2023-10-05 NOTE — Telephone Encounter (Signed)
Patient came and sign the application form for the patient support form for the Rezdiffra . Faxed form to the patient support and got confirmation form went through. Submitted PA through cover my meds. Waiting on response from insurance company

## 2023-10-05 NOTE — Telephone Encounter (Signed)
Recommend ultrasound-guided elastography DX: Fatty liver disease  RV

## 2023-10-06 NOTE — Telephone Encounter (Signed)
Patient verbalized understanding of instructions. Called and got patient schedule for 10/17/2023 [place]15[place] for a 8:30am scan. Nothing to eat or drink after midnight. Patient states she can not do that day because she will be out of town. Patient states to send the number to reschedule to her mychart.

## 2023-10-06 NOTE — Addendum Note (Signed)
Addended by: Radene Knee L on: 10/06/2023 08:28 AM   Modules accepted: Orders

## 2023-10-10 ENCOUNTER — Telehealth: Payer: Self-pay | Admitting: Physician Assistant

## 2023-10-10 ENCOUNTER — Encounter: Payer: Self-pay | Admitting: Physician Assistant

## 2023-10-10 NOTE — Telephone Encounter (Signed)
SS order faxed to [facility]; [phone]

## 2023-10-12 ENCOUNTER — Ambulatory Visit (HOSPITAL_COMMUNITY): Payer: BC Managed Care – PPO

## 2023-10-17 ENCOUNTER — Ambulatory Visit: Payer: BC Managed Care – PPO

## 2023-10-20 ENCOUNTER — Other Ambulatory Visit: Payer: BC Managed Care – PPO

## 2023-10-24 ENCOUNTER — Ambulatory Visit: Payer: BC Managed Care – PPO | Admitting: Oncology

## 2023-10-29 ENCOUNTER — Telehealth: Payer: BC Managed Care – PPO | Admitting: Physician Assistant

## 2023-10-29 DIAGNOSIS — N3000 Acute cystitis without hematuria: Secondary | ICD-10-CM

## 2023-10-29 MED ORDER — NITROFURANTOIN MONOHYD MACRO 100 MG PO CAPS: 100.0000 mg | ORAL_CAPSULE | Freq: Two times a day (BID) | ORAL | 0 refills | Status: AC

## 2023-10-29 NOTE — Progress Notes (Signed)
E-Visit for Urinary Problems ? ?We are sorry that you are not feeling well.  Here is how we plan to help! ? ?Based on what you shared with me it looks like you most likely have a simple urinary tract infection. ? ?A UTI (Urinary Tract Infection) is a bacterial infection of the bladder. ? ?Most cases of urinary tract infections are simple to treat but a key part of your care is to encourage you to drink plenty of fluids and watch your symptoms carefully. ? ?I have prescribed MacroBid 100 mg twice a day for 5 days.  Your symptoms should gradually improve. Call us if the burning in your urine worsens, you develop worsening fever, back pain or pelvic pain or if your symptoms do not resolve after completing the antibiotic. ? ?Urinary tract infections can be prevented by drinking plenty of water to keep your body hydrated.  Also be sure when you wipe, wipe from front to back and don't hold it in!  If possible, empty your bladder every 4 hours. ? ?HOME CARE ?Drink plenty of fluids ?Compete the full course of the antibiotics even if the symptoms resolve ?Remember, when you need to go?go. Holding in your urine can increase the likelihood of getting a UTI! ?GET HELP RIGHT AWAY IF: ?You cannot urinate ?You get a high fever ?Worsening back pain occurs ?You see blood in your urine ?You feel sick to your stomach or throw up ?You feel like you are going to pass out ? ?MAKE SURE YOU  ?Understand these instructions. ?Will watch your condition. ?Will get help right away if you are not doing well or get worse. ? ? ?Thank you for choosing an e-visit. ? ?Your e-visit answers were reviewed by a board certified advanced clinical practitioner to complete your personal care plan. Depending upon the condition, your plan could have included both over the counter or prescription medications. ? ?Please review your pharmacy choice. Make sure the pharmacy is open so you can pick up prescription now. If there is a problem, you may contact your  provider through MyChart messaging and have the prescription routed to another pharmacy.  Your safety is important to us. If you have drug allergies check your prescription carefully.  ? ?For the next 24 hours you can use MyChart to ask questions about today's visit, request a non-urgent call back, or ask for a work or school excuse. ?You will get an email in the next two days asking about your experience. I hope that your e-visit has been valuable and will speed your recovery. ? ?I have spent 5 minutes in review of e-visit questionnaire, review and updating patient chart, medical decision making and response to patient.  ? ?Zainab Crumrine S Mayers, PA-C ? ? ? ? ?

## 2023-10-31 ENCOUNTER — Telehealth: Payer: BC Managed Care – PPO | Admitting: Nurse Practitioner

## 2023-10-31 DIAGNOSIS — B001 Herpesviral vesicular dermatitis: Secondary | ICD-10-CM

## 2023-10-31 MED ORDER — ACYCLOVIR 800 MG PO TABS: 800.0000 mg | ORAL_TABLET | Freq: Two times a day (BID) | ORAL | 0 refills | Status: AC

## 2023-10-31 NOTE — Progress Notes (Signed)
 E-Visit for [employer]  We are sorry that you are not feeling well.  Here is how we plan to help!  Based on what you have shared with me it does look like you have a viral infection.    Most cold sores or fever blisters are small fluid filled blisters around the mouth caused by herpes simplex virus.  The most common strain of the virus causing cold sores is herpes simplex virus 1.  It can be spread by skin contact, sharing eating utensils, or even sharing towels.  Cold sores are contagious to other people until dry. (Approximately 5-7 days).  Wash your hands. You can spread the virus to your eyes through handling your contact lenses after touching the lesions.  Most people experience pain at the sight or tingling sensations in their lips that may begin before the ulcers erupt.  Herpes simplex is treatable but not curable.  It may lie dormant for a long time and then reappear due to stress or prolonged sun exposure.  Many patients have success in treating their cold sores with an over the counter topical called Abreva.  You may apply the cream up to 5 times daily (maximum 10 days) until healing occurs.  If you would like to use an oral antiviral medication to speed the healing of your cold sore, I have sent a prescription to your local pharmacy Acyclovir 800 mg take one by mouth twice a day for 7 days    HOME CARE:  Wash your hands frequently. Do not pick at or rub the sore. Don't open the blisters. Avoid kissing other people during this time. Avoid sharing drinking glasses, eating utensils, or razors. Do not handle contact lenses unless you have thoroughly washed your hands with soap and warm water! Avoid oral sex during this time.  Herpes from sores on your mouth can spread to your partner's genital area. Avoid contact with anyone who has eczema or a weakened immune system. Cold sores are often triggered by exposure to intense sunlight, use a lip balm containing a sunscreen (SPF 30 or  higher).  GET HELP RIGHT AWAY IF:  Blisters look infected. Blisters occur near or in the eye. Symptoms last longer than 10 days. Your symptoms become worse.  MAKE SURE YOU:  Understand these instructions. Will watch your condition. Will get help right away if you are not doing well or get worse.    Your e-visit answers were reviewed by a board certified advanced clinical practitioner to complete your personal care plan.  Depending upon the condition, your plan could have  Included both over the counter or prescription medications.    Please review your pharmacy choice.  Be sure that the pharmacy you have chosen is open so that you can pick up your prescription now.  If there is a problem you can message your provider in MyChart to have the prescription routed to another pharmacy.    Your safety is important to [place].  If you have drug allergies check our prescription carefully.  For the next 24 hours you can use MyChart to ask questions about today's visit, request a non-urgent call back, or ask for a work or school excuse from your e-visit provider.  You will get an email in the next two days asking about your experience.  I hope that your e-visit has been valuable and will speed your recovery.   Meds ordered this encounter  Medications   acyclovir (ZOVIRAX) 800 MG tablet    Sig:  Take 1 tablet (800 mg total) by mouth 2 (two) times daily for 7 days.    Dispense:  14 tablet    Refill:  0     I spent approximately 5 minutes reviewing the patient's history, current symptoms and coordinating their care today.

## 2023-11-03 ENCOUNTER — Ambulatory Visit (HOSPITAL_COMMUNITY): Payer: BC Managed Care – PPO

## 2023-11-06 ENCOUNTER — Encounter: Payer: Self-pay | Admitting: Physician Assistant

## 2023-11-06 ENCOUNTER — Telehealth: Payer: Self-pay | Admitting: *Deleted

## 2023-11-06 ENCOUNTER — Ambulatory Visit (INDEPENDENT_AMBULATORY_CARE_PROVIDER_SITE_OTHER): Payer: BC Managed Care – PPO | Admitting: Physician Assistant

## 2023-11-06 VITALS — BP 128/80 | HR 95 | Temp 97.8°F | Resp 16 | Ht 63.0 in | Wt 168.0 lb

## 2023-11-06 DIAGNOSIS — R300 Dysuria: Secondary | ICD-10-CM | POA: Diagnosis not present

## 2023-11-06 DIAGNOSIS — R319 Hematuria, unspecified: Secondary | ICD-10-CM | POA: Diagnosis not present

## 2023-11-06 DIAGNOSIS — I10 Essential (primary) hypertension: Secondary | ICD-10-CM | POA: Diagnosis not present

## 2023-11-06 DIAGNOSIS — E663 Overweight: Secondary | ICD-10-CM

## 2023-11-06 DIAGNOSIS — J0100 Acute maxillary sinusitis, unspecified: Secondary | ICD-10-CM | POA: Diagnosis not present

## 2023-11-06 DIAGNOSIS — N390 Urinary tract infection, site not specified: Secondary | ICD-10-CM

## 2023-11-06 LAB — POCT URINALYSIS DIPSTICK
Bilirubin, UA: NEGATIVE
Glucose, UA: NEGATIVE
Ketones, UA: POSITIVE
Nitrite, UA: POSITIVE
Protein, UA: NEGATIVE
Spec Grav, UA: 1.01 (ref 1.010–1.025)
Urobilinogen, UA: 0.2 U/dL
pH, UA: 7.5 (ref 5.0–8.0)

## 2023-11-06 MED ORDER — AMOXICILLIN-POT CLAVULANATE 875-125 MG PO TABS: 1.0000 | ORAL_TABLET | Freq: Two times a day (BID) | ORAL | 0 refills | Status: DC

## 2023-11-06 NOTE — Telephone Encounter (Signed)
 Patient was transferred from endo unit. Patient needs to reschedule because she was started on antibiotic for sinus infection and UTI today.  Requesting to reschedule to 12/06/2023.  New instructions will be sent.

## 2023-11-06 NOTE — Progress Notes (Signed)
 [facility] [address] [place], [place] [address]  Internal MEDICINE  Office Visit Note  Patient Name: Jennifer Barron  [id number]  [id number]  Date of Service: 11/08/2023  Chief Complaint  Patient presents with   Follow-up   Migraine    HPI Pt is here for routine follow up -Having some congestion as well as UTI symptoms since Christmas -Traveled to [place] off of [place], but was feeling a little sick prior to traveling -Has not taken zepbound  recently due to not feeling well -Has tried OTC cold medicine, did have ABX for UTI, but didn't help. Was put on macrobid  but no C/S done.  -lower pelvic pressure and burning with urination, a little right flank pain, though left flank tender on exam -ear pressure, sinus congestion, some cough, no SOB.   Current Medication: Outpatient Encounter Medications as of 11/06/2023  Medication Sig   amoxicillin -clavulanate (AUGMENTIN ) 875-125 MG tablet Take 1 tablet by mouth 2 (two) times daily. Take with food.   [EXPIRED] acyclovir  (ZOVIRAX ) 800 MG tablet Take 1 tablet (800 mg total) by mouth 2 (two) times daily for 7 days.   amLODipine  (NORVASC ) 5 MG tablet Take 1 tablet (5 mg total) by mouth daily.   cholecalciferol (VITAMIN D ) 1000 units tablet Take 1 tablet (1,000 Units total) by mouth daily.   spironolactone (ALDACTONE) 50 MG tablet    tirzepatide  (ZEPBOUND ) 5 MG/0.5ML Pen Inject 5 mg into the skin once a week.   No facility-administered encounter medications on file as of 11/06/2023.    Surgical History: Past Surgical History:  Procedure Laterality Date   abdominal laproscopy     ADENOIDECTOMY     twice   TONSILLECTOMY Bilateral 1987   WISDOM TOOTH EXTRACTION  2009    Medical History: Past Medical History:  Diagnosis Date   Endometriosis    Migraine    PCOS (polycystic ovarian syndrome)     Family History: Family History  Problem Relation Age of Onset   Breast cancer Mother    Cancer Father    Ovarian  cancer Paternal Grandmother    Cancer Paternal Grandmother    Prostate cancer Paternal Grandfather    Cancer Paternal Grandfather     Social History   Socioeconomic History   Marital status: Married    Spouse name: Not on file   Number of children: Not on file   Years of education: Not on file   Highest education level: Not on file  Occupational History   Not on file  Tobacco Use   Smoking status: Never   Smokeless tobacco: Never  Vaping Use   Vaping status: Never Used  Substance and Sexual Activity   Alcohol use: Yes    Comment: social   Drug use: No   Sexual activity: Yes  Other Topics Concern   Not on file  Social History Narrative   Not on file   Social Drivers of Health   Financial Resource Strain: Not on file  Food Insecurity: Not on file  Transportation Needs: Not on file  Physical Activity: Not on file  Stress: Not on file  Social Connections: Not on file  Intimate Partner Violence: Not on file      Review of Systems  Constitutional:  Negative for fatigue and fever.  HENT:  Positive for congestion, ear pain, postnasal drip and sinus pressure. Negative for mouth sores.   Respiratory:  Positive for cough. Negative for shortness of breath and wheezing.   Cardiovascular:  Negative for chest  pain.  Gastrointestinal:  Negative for diarrhea.  Genitourinary:  Positive for dysuria, flank pain and pelvic pain.  Skin:  Negative for rash.  Neurological:  Negative for syncope.  Psychiatric/Behavioral: Negative.  Negative for suicidal ideas.     Vital Signs: BP 128/80   Pulse 95   Temp 97.8 F (36.6 C)   Resp 16   Ht 5' 3 (1.6 m)   Wt 168 lb (76.2 kg)   SpO2 96%   BMI 29.76 kg/m    Physical Exam Vitals and nursing note reviewed.  Constitutional:      Appearance: Normal appearance.  HENT:     Head: Normocephalic and atraumatic.     Nose: Congestion present.     Mouth/Throat:     Mouth: Mucous membranes are moist.     Pharynx: No posterior  oropharyngeal erythema.  Eyes:     Extraocular Movements: Extraocular movements intact.     Pupils: Pupils are equal, round, and reactive to light.  Cardiovascular:     Rate and Rhythm: Normal rate and regular rhythm.     Pulses: Normal pulses.     Heart sounds: Normal heart sounds.  Pulmonary:     Effort: Pulmonary effort is normal.     Breath sounds: Normal breath sounds.  Abdominal:     General: Abdomen is flat.     Tenderness: There is left CVA tenderness. There is no right CVA tenderness.  Musculoskeletal:        General: Normal range of motion.     Cervical back: Normal range of motion.  Skin:    General: Skin is warm and dry.  Neurological:     General: No focal deficit present.     Mental Status: She is alert.  Psychiatric:        Mood and Affect: Mood normal.        Behavior: Behavior normal.        Thought Content: Thought content normal.        Judgment: Judgment normal.        Assessment/Plan: 1. Acute non-recurrent maxillary sinusitis (Primary) Will start on augmentin  to tx sinusitis along with possible coverage for UTI. May use flonase and mucinex. - amoxicillin -clavulanate (AUGMENTIN ) 875-125 MG tablet; Take 1 tablet by mouth 2 (two) times daily. Take with food.  Dispense: 20 tablet; Refill: 0  2. Urinary tract infection with hematuria, site unspecified - CULTURE, URINE COMPREHENSIVE  3. Dysuria Will tx with augmentin  for sinusitis along with possible coverage for UTI. Drink plenty of fluids and will adjust ABX based on C/S - amoxicillin -clavulanate (AUGMENTIN ) 875-125 MG tablet; Take 1 tablet by mouth 2 (two) times daily. Take with food.  Dispense: 20 tablet; Refill: 0 - POCT Urinalysis Dipstick  4. Benign hypertension Stable, continue current medication  5. Overweight (BMI 25.0-29.9) Will restart on zepbound  once feeling better. Continue to work on diet and exercise   General Counseling: Shalonda verbalizes understanding of the findings of todays  visit and agrees with plan of treatment. I have discussed any further diagnostic evaluation that may be needed or ordered today. We also reviewed her medications today. she has been encouraged to call the office with any questions or concerns that should arise related to todays visit.    Orders Placed This Encounter  Procedures   CULTURE, URINE COMPREHENSIVE   POCT Urinalysis Dipstick    Meds ordered this encounter  Medications   amoxicillin -clavulanate (AUGMENTIN ) 875-125 MG tablet    Sig: Take 1 tablet by mouth  2 (two) times daily. Take with food.    Dispense:  20 tablet    Refill:  0    This patient was seen by Tinnie Pro, PA-C in collaboration with Dr. Sigrid Bathe as a part of collaborative care agreement.   Total time spent:30 Minutes Time spent includes review of chart, medications, test results, and follow up plan with the patient.      Dr Fozia M Khan Internal medicine

## 2023-11-07 ENCOUNTER — Inpatient Hospital Stay: Payer: BC Managed Care – PPO

## 2023-11-09 ENCOUNTER — Other Ambulatory Visit: Payer: Self-pay | Admitting: Physician Assistant

## 2023-11-09 ENCOUNTER — Encounter: Payer: Self-pay | Admitting: Physician Assistant

## 2023-11-09 ENCOUNTER — Ambulatory Visit: Payer: BC Managed Care – PPO | Admitting: Oncology

## 2023-11-09 DIAGNOSIS — N390 Urinary tract infection, site not specified: Secondary | ICD-10-CM

## 2023-11-09 LAB — CULTURE, URINE COMPREHENSIVE

## 2023-11-09 MED ORDER — DOXYCYCLINE HYCLATE 100 MG PO TABS: 100.0000 mg | ORAL_TABLET | Freq: Two times a day (BID) | ORAL | 0 refills | Status: DC

## 2023-11-14 ENCOUNTER — Ambulatory Visit (HOSPITAL_COMMUNITY): Admission: RE | Admit: 2023-11-14 | Payer: BC Managed Care – PPO | Source: Ambulatory Visit

## 2023-11-17 ENCOUNTER — Inpatient Hospital Stay: Payer: BC Managed Care – PPO | Attending: Oncology

## 2023-11-17 ENCOUNTER — Telehealth: Payer: Self-pay

## 2023-11-17 ENCOUNTER — Encounter: Payer: Self-pay | Admitting: Oncology

## 2023-11-17 DIAGNOSIS — R7401 Elevation of levels of liver transaminase levels: Secondary | ICD-10-CM | POA: Diagnosis not present

## 2023-11-17 DIAGNOSIS — Z148 Genetic carrier of other disease: Secondary | ICD-10-CM

## 2023-11-17 DIAGNOSIS — R634 Abnormal weight loss: Secondary | ICD-10-CM | POA: Diagnosis not present

## 2023-11-17 DIAGNOSIS — D751 Secondary polycythemia: Secondary | ICD-10-CM

## 2023-11-17 LAB — COMPREHENSIVE METABOLIC PANEL
ALT: 176 U/L — ABNORMAL HIGH (ref 0–44)
AST: 256 U/L (ref 15–41)
Albumin: 4.1 g/dL (ref 3.5–5.0)
Alkaline Phosphatase: 69 U/L (ref 38–126)
Anion gap: 10 (ref 5–15)
BUN: 13 mg/dL (ref 6–20)
CO2: 25 mmol/L (ref 22–32)
Calcium: 9.7 mg/dL (ref 8.9–10.3)
Chloride: 98 mmol/L (ref 98–111)
Creatinine, Ser: 0.68 mg/dL (ref 0.44–1.00)
GFR, Estimated: >60 mL/min (ref >60)
Glucose, Bld: 98 mg/dL (ref 70–99)
Potassium: 4.1 mmol/L (ref 3.5–5.1)
Sodium: 133 mmol/L — ABNORMAL LOW (ref 135–145)
Total Bilirubin: 1.7 mg/dL — ABNORMAL HIGH (ref 0.0–1.2)
Total Protein: 7.2 g/dL (ref 6.5–8.1)

## 2023-11-17 LAB — IRON AND TIBC
Iron: 188 ug/dL — ABNORMAL HIGH (ref 28–170)
Saturation Ratios: 39 % — ABNORMAL HIGH (ref 10.4–31.8)
TIBC: 484 ug/dL — ABNORMAL HIGH (ref 250–450)
UIBC: 296 ug/dL

## 2023-11-17 LAB — CBC WITH DIFFERENTIAL/PLATELET
Abs Immature Granulocytes: 0.03 10*3/uL (ref 0.00–0.07)
Basophils Absolute: 0 10*3/uL (ref 0.0–0.1)
Basophils Relative: 0 %
Eosinophils Absolute: 0.1 10*3/uL (ref 0.0–0.5)
Eosinophils Relative: 1 %
HCT: 41.2 % (ref 36.0–46.0)
Hemoglobin: 14.3 g/dL (ref 12.0–15.0)
Immature Granulocytes: 0 %
Lymphocytes Relative: 25 %
Lymphs Abs: 1.9 10*3/uL (ref 0.7–4.0)
MCH: 34.3 pg — ABNORMAL HIGH (ref 26.0–34.0)
MCHC: 34.7 g/dL (ref 30.0–36.0)
MCV: 98.8 fL (ref 80.0–100.0)
Monocytes Absolute: 0.8 10*3/uL (ref 0.1–1.0)
Monocytes Relative: 10 %
Neutro Abs: 4.8 10*3/uL (ref 1.7–7.7)
Neutrophils Relative %: 64 %
Platelets: 328 10*3/uL (ref 150–400)
RBC: 4.17 MIL/uL (ref 3.87–5.11)
RDW: 12.9 % (ref 11.5–15.5)
WBC: 7.6 10*3/uL (ref 4.0–10.5)
nRBC: 0.5 % — ABNORMAL HIGH (ref 0.0–0.2)

## 2023-11-17 LAB — FERRITIN: Ferritin: 884 ng/mL — ABNORMAL HIGH (ref 11–307)

## 2023-11-17 NOTE — Telephone Encounter (Signed)
Received critical lab value from [facility]  AST [phone]  MD notified.

## 2023-11-20 ENCOUNTER — Encounter: Payer: Self-pay | Admitting: Gastroenterology

## 2023-11-21 ENCOUNTER — Other Ambulatory Visit: Payer: Self-pay | Admitting: Physician Assistant

## 2023-11-21 ENCOUNTER — Telehealth: Payer: Self-pay

## 2023-11-21 ENCOUNTER — Ambulatory Visit (HOSPITAL_COMMUNITY): Payer: BC Managed Care – PPO

## 2023-11-21 DIAGNOSIS — N390 Urinary tract infection, site not specified: Secondary | ICD-10-CM

## 2023-11-21 DIAGNOSIS — R7989 Other specified abnormal findings of blood chemistry: Secondary | ICD-10-CM

## 2023-11-21 DIAGNOSIS — J0100 Acute maxillary sinusitis, unspecified: Secondary | ICD-10-CM

## 2023-11-21 MED ORDER — LEVOFLOXACIN 500 MG PO TABS: 500.0000 mg | ORAL_TABLET | Freq: Every day | ORAL | 0 refills | Status: AC

## 2023-11-21 MED ORDER — PREDNISONE 10 MG PO TABS: ORAL_TABLET | ORAL | 0 refills | Status: DC

## 2023-11-21 NOTE — Telephone Encounter (Signed)
Order the lab work for patient. Informed patient on mychart that labs were order

## 2023-11-21 NOTE — Telephone Encounter (Signed)
-----   Message from [facility] sent at 11/21/2023 12:46 PM EST ----- Regarding: Labs Please order LFTs and acute viral hepatitis panel Dx: Worsening LFTs  RV

## 2023-11-22 ENCOUNTER — Encounter: Payer: Self-pay | Admitting: Oncology

## 2023-11-22 ENCOUNTER — Inpatient Hospital Stay (HOSPITAL_BASED_OUTPATIENT_CLINIC_OR_DEPARTMENT_OTHER): Payer: BC Managed Care – PPO | Admitting: Oncology

## 2023-11-22 ENCOUNTER — Inpatient Hospital Stay: Payer: BC Managed Care – PPO

## 2023-11-22 VITALS — BP 137/90 | HR 95 | Temp 97.0°F | Resp 18 | Wt 176.1 lb

## 2023-11-22 DIAGNOSIS — D751 Secondary polycythemia: Secondary | ICD-10-CM

## 2023-11-22 DIAGNOSIS — Z148 Genetic carrier of other disease: Secondary | ICD-10-CM

## 2023-11-22 DIAGNOSIS — R7401 Elevation of levels of liver transaminase levels: Secondary | ICD-10-CM

## 2023-11-22 DIAGNOSIS — R634 Abnormal weight loss: Secondary | ICD-10-CM | POA: Diagnosis not present

## 2023-11-22 NOTE — Progress Notes (Signed)
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 Lab Results  Component Value Date   HGB 14.3 11/17/2023   TIBC 484 (H) 11/17/2023   IRONPCTSAT 39 (H) 11/17/2023   FERRITIN 884 (H) 11/17/2023   Ferritin has increased  high iron saturation, she has also worse LFT.  High TIBC can be due to liver damage.  Recommend phebotomy 300cc x1 today.  Recent UTI could falsely increase ferritin level.  I will repeat iron panel in 2 weeks   Erythrocytosis Normal epo and carbo monoxide level, negative Jak 2 V617F mutation reflex to other mutations, negaitve BCR ABL1 FISH, Less likely primary erythrocytosis.  Improved erythrocytosis.  Hemoglobin normalized.  Transaminitis Likely due to fatty liver disease US abdomen RUQ - Increased hepatic parenchymal echogenicity suggestive of steatosis. Avoid alcohol.  LFT is worse.  GI has ordered ultrasound elastography. Recommend patient to follow-up with Dr. Allegra Lai for evaluation of need of liver biopsy.  Orders Placed This Encounter  Procedures   CBC with Differential (Cancer Center Only)    Standing Status:   Future    Expected Date:   12/06/2023    Expiration Date:   11/21/2024   Iron and TIBC    Standing Status:   Future    Expected Date:   12/06/2023    Expiration Date:   11/21/2024   Ferritin    Standing Status:   Future    Expected Date:   12/06/2023    Expiration Date:   11/21/2024   Hepatic function panel    Standing Status:   Future    Expected Date:   12/06/2023    Expiration Date:   11/21/2024   Hepatitis panel, acute    Standing Status:   Future    Expected Date:   12/06/2023    Expiration Date:   11/21/2024    Follow up in 4 months.    All questions were answered. The patient knows to call the clinic with any problems, questions or concerns.  Rickard Patience, MD, PhD [facility] Hematology Oncology 11/22/2023   REASON FOR VISIT Follow up for treatment of hemochromatosis, elevated iron panel   HISTORY OF PRESENTING ILLNESS:  Jennifer Barron is a  43 y.o.  female present for follow up of hemachromatosis carrier, high iron level Patient follows up with GYN for IUD placement, and was fine to have abnormal liver function test, mainly elevated transaminitis.  Patient went to primary care physician Dr. Welton Flakes And had additional testing.  She was found to have elevated ferritin level at 547, iron saturation 33, ultrasound of abdomen was done on January 12, 2018, which showed upper normal limits spleen size and increased echogenicity of liver questionable fatty liver. Patient was referred to [facility] for further evaluation of elevated ferritin. Currently patient takes spironolactone for acne and this was started by dermatologist a week ago.  She also takes phentermine for obesity, recently started. Patient reports feeling tired fatigue.  Denies any joint pain shortness of breath, lower extremity swelling.  She denies any family history of hemochromatosis.  INTERVAL HISTORY Jennifer Barron is a 43 y.o. female who has above history reviewed by me today presents for follow-up visit for heterozygous hemochromatosis, elevated iron saturation.  She was previously on  zepbound for weight loss, she has stopped this medication. Drinks alcohol socially Patient has UTI and has been prescribed antibiotics.  She has not started yet.  She complains having pain 7 out of 10 to the right flank/back area.   Review of Systems  Constitutional:  Positive for malaise/fatigue. Negative for chills, fever and weight loss.  HENT:  Negative for sore throat.   Eyes:  Negative for redness.  Respiratory:  Negative for cough,  shortness of breath and wheezing.   Cardiovascular:  Negative for chest pain, palpitations and leg swelling.  Gastrointestinal:  Negative for abdominal pain, blood in stool, nausea and vomiting.  Genitourinary:  Negative for dysuria.  Musculoskeletal:  Negative for myalgias.       Right flank/back pain  Skin:  Negative for rash.  Neurological:  Negative for dizziness, tingling, tremors and headaches.  Endo/Heme/Allergies:  Does not bruise/bleed easily.  Psychiatric/Behavioral:  Negative for hallucinations.     MEDICAL HISTORY:  Past Medical History:  Diagnosis Date   Endometriosis    Migraine    PCOS (polycystic ovarian syndrome)     SURGICAL HISTORY: Past Surgical History:  Procedure Laterality Date   abdominal laproscopy     ADENOIDECTOMY     twice   TONSILLECTOMY Bilateral 1987   WISDOM TOOTH EXTRACTION  2009    SOCIAL HISTORY: Social History   Socioeconomic History   Marital status: Married    Spouse name: Not on file   Number of children: Not on file   Years of education: Not on file   Highest education level: Not on file  Occupational History   Not on file  Tobacco Use   Smoking status: Never   Smokeless tobacco: Never  Vaping Use   Vaping status: Never Used  Substance and Sexual Activity   Alcohol use: Yes    Comment: social   Drug use: No   Sexual activity: Yes  Other Topics Concern   Not on file  Social History Narrative   Not on file   Social Drivers of Health   Financial Resource Strain: Not on file  Food Insecurity: Not on file  Transportation Needs: Not on file  Physical Activity: Not on file  Stress: Not on file  Social Connections: Not on file  Intimate Partner Violence: Not on file    FAMILY HISTORY: Family History  Problem Relation Age of Onset   Breast cancer Mother    Cancer Father    Ovarian cancer Paternal Grandmother    Cancer Paternal Grandmother    Prostate cancer Paternal Grandfather    Cancer Paternal Grandfather      ALLERGIES:  has no known allergies.  MEDICATIONS:  Current Outpatient Medications  Medication Sig Dispense Refill   amLODipine (NORVASC) 5 MG tablet Take 1 tablet (5 mg total) by mouth daily. 30 tablet 2   cholecalciferol (VITAMIN D) 1000 units tablet Take 1 tablet (1,000 Units total) by mouth daily. 90 tablet 0   levofloxacin (LEVAQUIN) 500 MG tablet Take 1 tablet (500 mg total) by mouth daily for 7 days. 7 tablet 0   predniSONE (DELTASONE) 10 MG tablet Use per dose pack 21 tablet 0   spironolactone (ALDACTONE) 50 MG tablet   4   tirzepatide (ZEPBOUND) 5 MG/0.5ML Pen Inject 5 mg into the skin once a week. 2 mL 2   No current facility-administered medications for this visit.     PHYSICAL EXAMINATION: ECOG PERFORMANCE STATUS: 1 - Symptomatic but completely ambulatory Vitals:   11/22/23 1434  BP: (!) 137/90  Pulse: 95  Resp: 18  Temp: (!) 97 F (36.1 C)  SpO2: 100%   Filed Weights   11/22/23 1434  Weight: 176 lb 1.6 oz (79.9 kg)     Physical Exam Constitutional:      General: She is not in acute distress.    Appearance: She is obese.  HENT:     Head: Normocephalic and atraumatic.  Eyes:     General: No scleral icterus. Cardiovascular:     Rate and Rhythm: Normal rate and regular rhythm.  Pulmonary:     Effort: Pulmonary effort is normal. No respiratory distress.     Breath sounds: Normal breath sounds.  Abdominal:     General: Bowel sounds are normal. There is no distension.     Palpations: Abdomen is soft.  Musculoskeletal:        General: No deformity. Normal range of motion.     Cervical back: Normal range of motion and neck supple.  Lymphadenopathy:     Cervical: No cervical adenopathy.  Skin:    General: Skin is warm and dry.     Findings: No erythema or rash.  Neurological:     Mental Status: She is alert and oriented to person, place, and time. Mental status is at baseline.     Cranial Nerves: No cranial nerve deficit.  Psychiatric:        Mood  and Affect: Mood normal.      LABORATORY DATA:  I have reviewed the data as listed    Latest Ref Rng & Units 11/17/2023    8:06 AM 07/25/2023    1:30 PM 07/19/2022    8:27 AM  CBC  WBC 4.0 - 10.5 K/uL 7.6  7.1  5.9   Hemoglobin 12.0 - 15.0 g/dL 40.9  81.1  91.4   Hematocrit 36.0 - 46.0 % 41.2  49.7  44.4   Platelets 150 - 400 K/uL 328  286  300       Latest Ref Rng & Units 11/17/2023    8:06 AM 07/25/2023    1:30 PM 07/19/2022    8:27 AM  CMP  Glucose 70 - 99 mg/dL 98  782  956   BUN 6 - 20 mg/dL 13  6  10    Creatinine 0.44 - 1.00 mg/dL 2.13  0.86  5.78   Sodium 135 - 145 mmol/L 133  130  134   Potassium 3.5 - 5.1 mmol/L 4.1  3.2  4.8   Chloride 98 - 111 mmol/L 98  92  100   CO2 22 - 32 mmol/L 25  24  28    Calcium 8.9 - 10.3 mg/dL 9.7  9.3  9.4   Total Protein 6.5 - 8.1 g/dL 7.2  7.1  7.6   Total Bilirubin 0.0 - 1.2 mg/dL 1.7  3.4  1.2   Alkaline Phos 38 - 126 U/L 69  63  56   AST 15 - 41 U/L 256  130  68   ALT 0 - 44 U/L 176  152  101     Lab Results  Component Value Date   IRON 188 (H) 11/17/2023   TIBC 484 (H) 11/17/2023   FERRITIN 884 (H) 11/17/2023   Hepatitis B surface antigen negative, hepatitis C RNA nondetectable.  01/12/2018 US abdomen showed upper normal limits spleen size and increased echogenicity of liver questionable fatty liver.

## 2023-11-22 NOTE — Assessment & Plan Note (Addendum)
Heterozygous H63D hemochromatosis/abnormal iron labs/transaminitis Labs are reviewed and discussed with patient. Lab Results  Component Value Date   HGB 14.3 11/17/2023   TIBC 484 (H) 11/17/2023   IRONPCTSAT 39 (H) 11/17/2023   FERRITIN 884 (H) 11/17/2023   Ferritin has increased  high iron saturation, she has also worse LFT.  High TIBC can be due to liver damage.  Recommend phebotomy 300cc x1 today.  Recent UTI could falsely increase ferritin level.  I will repeat iron panel in 2 weeks

## 2023-11-22 NOTE — Progress Notes (Signed)
Pt here for follow up. Reports having pain 7/10 to right flank/back area. Pt is also on antibiotics for UTI

## 2023-11-22 NOTE — Assessment & Plan Note (Signed)
Likely due to fatty liver disease US abdomen RUQ - Increased hepatic parenchymal echogenicity suggestive of steatosis. Avoid alcohol.  LFT is worse.  GI has ordered ultrasound elastography. Recommend patient to follow-up with Jennifer Barron for evaluation of need of liver biopsy.

## 2023-11-22 NOTE — Assessment & Plan Note (Addendum)
Normal epo and carbo monoxide level, negative Jak 2 V617F mutation reflex to other mutations, negaitve BCR ABL1 FISH, Less likely primary erythrocytosis.  Improved erythrocytosis.  Hemoglobin normalized.

## 2023-11-28 ENCOUNTER — Other Ambulatory Visit: Payer: BC Managed Care – PPO

## 2023-11-28 ENCOUNTER — Telehealth: Payer: Self-pay | Admitting: Physician Assistant

## 2023-11-28 NOTE — Telephone Encounter (Signed)
Per [facility], patient declined SS at this time. Want to think about it-Toni

## 2023-11-30 ENCOUNTER — Ambulatory Visit: Payer: BC Managed Care – PPO | Admitting: Oncology

## 2023-12-01 ENCOUNTER — Other Ambulatory Visit: Payer: Self-pay | Admitting: Physician Assistant

## 2023-12-01 DIAGNOSIS — N390 Urinary tract infection, site not specified: Secondary | ICD-10-CM

## 2023-12-04 ENCOUNTER — Telehealth: Payer: Self-pay

## 2023-12-04 NOTE — Telephone Encounter (Signed)
 Patient left on voicemail stating she is needing to cancel her EGD that is schedule for 12/06/2023. Return patient call and she states she has to see another provider before having the EGD done and then after she see them she will get the EGD reschedule. Called ENDO and talk to Sheryle Donning and they will get the patient cancel

## 2023-12-06 ENCOUNTER — Ambulatory Visit
Admission: RE | Admit: 2023-12-06 | Payer: BC Managed Care – PPO | Source: Home / Self Care | Admitting: Gastroenterology

## 2023-12-06 ENCOUNTER — Ambulatory Visit (HOSPITAL_COMMUNITY): Payer: BC Managed Care – PPO

## 2023-12-06 SURGERY — ESOPHAGOGASTRODUODENOSCOPY (EGD) WITH PROPOFOL
Anesthesia: General

## 2023-12-07 ENCOUNTER — Inpatient Hospital Stay: Payer: BC Managed Care – PPO | Attending: Oncology

## 2023-12-18 ENCOUNTER — Ambulatory Visit: Payer: BC Managed Care – PPO | Admitting: Physician Assistant

## 2023-12-20 ENCOUNTER — Telehealth: Payer: Self-pay | Admitting: Physician Assistant

## 2023-12-20 NOTE — Telephone Encounter (Signed)
 Lvm & sent mychart msg to move 03/29/24 appointment-Toni

## 2023-12-22 ENCOUNTER — Telehealth: Payer: Self-pay | Admitting: Physician Assistant

## 2023-12-22 NOTE — Telephone Encounter (Signed)
 SS appointment 01/12/24 @ [facility]-Toni

## 2023-12-27 DIAGNOSIS — H0288B Meibomian gland dysfunction left eye, upper and lower eyelids: Secondary | ICD-10-CM | POA: Diagnosis not present

## 2023-12-27 DIAGNOSIS — H0288A Meibomian gland dysfunction right eye, upper and lower eyelids: Secondary | ICD-10-CM | POA: Diagnosis not present

## 2023-12-27 DIAGNOSIS — L718 Other rosacea: Secondary | ICD-10-CM | POA: Diagnosis not present

## 2023-12-27 DIAGNOSIS — H524 Presbyopia: Secondary | ICD-10-CM | POA: Diagnosis not present

## 2023-12-28 ENCOUNTER — Encounter: Payer: Self-pay | Admitting: Physician Assistant

## 2023-12-28 ENCOUNTER — Telehealth: Payer: Self-pay | Admitting: Physician Assistant

## 2023-12-28 NOTE — Telephone Encounter (Signed)
 I s/w [name] w/ [facility] regarding patient's concern that insurance had denied due to notes being too old. [name] confirmed that insurance has approved SS that was ordered 09/29/23 along with 09/29/23 office notes. Patient is to arrive @ [facility] @ 8:00 pm on 01/12/24 for SS. I lvm notifying patient-Jennifer Barron

## 2024-01-10 ENCOUNTER — Telehealth: Payer: Self-pay | Admitting: Physician Assistant

## 2024-01-10 ENCOUNTER — Inpatient Hospital Stay

## 2024-01-10 NOTE — Telephone Encounter (Addendum)
 Patient called stating [employer] has denied her SS. They stated they have received 2 PA requests. 1st request was already approved, that is why 2nd request was denied. They need someone to call them to update the approved request. I emailed [name] w/ [facility]-Toni  Received call from [place] w/ [facility]. She stated insurance denied lab study, but will approve HST, which is what they explained to patient. Patient stated she did not want HST, she wants done in lab, so [facility] told her to call her insurance to see if they would approve lab study for her, not for her to call us. They will call patient to explain again-Toni

## 2024-01-11 ENCOUNTER — Telehealth: Payer: Self-pay | Admitting: Physician Assistant

## 2024-01-11 ENCOUNTER — Ambulatory Visit: Payer: BC Managed Care – PPO | Admitting: Gastroenterology

## 2024-01-11 ENCOUNTER — Encounter: Payer: Self-pay | Admitting: Physician Assistant

## 2024-01-11 ENCOUNTER — Ambulatory Visit (INDEPENDENT_AMBULATORY_CARE_PROVIDER_SITE_OTHER): Payer: BC Managed Care – PPO | Admitting: Physician Assistant

## 2024-01-11 VITALS — BP 115/70 | HR 89 | Temp 98.4°F | Resp 16 | Ht 65.0 in | Wt 181.4 lb

## 2024-01-11 DIAGNOSIS — E66811 Obesity, class 1: Secondary | ICD-10-CM | POA: Diagnosis not present

## 2024-01-11 DIAGNOSIS — I10 Essential (primary) hypertension: Secondary | ICD-10-CM | POA: Diagnosis not present

## 2024-01-11 DIAGNOSIS — G4710 Hypersomnia, unspecified: Secondary | ICD-10-CM | POA: Diagnosis not present

## 2024-01-11 NOTE — Telephone Encounter (Signed)
 SS order emailed to [facility] w/  [facility]-Toni

## 2024-01-11 NOTE — Progress Notes (Signed)
 [facility] [address] [place], [place] [address]  Internal MEDICINE  Office Visit Note  Patient Name: Jennifer Barron  [id number]  [id number]  Date of Service: 01/11/2024  Chief Complaint  Patient presents with   Follow-up   Weight Loss    HPI Pt is here for routine follow up -Has not taken zepbound recently, because she was feeling poorly. She had several rounds of antibiotics for UTI that was not improving. Has been referred to urology but not seeing them until April. Actually did some acupuncture and helped her feel better. -Now that zepbound stopped realizing how bad she felt on it. Had some hair loss and dry skin in addition to the nausea. Therefore not going to restart. Discussed trying to maintain weight now with diet and exercise, has gained some back because she is eating some again which finds more healthy.   -is taking probiotic now -when feeling poorly did cancel some other visits with GI, was overwhelmed with testing and wasn't sure her concerns were really being addressed with these tests. She will plan to follow up once hematology labs updated and rechecked -Been having an issue with sleep study scheduling, insurance denying the in lab now. She did speak with insurance party about auth and states it could be reordered/submitted to try for in lab study -Pt has hx of snoring, long hx of waking herself up/gasping in her sleep, and waking groggy. She breathes through her mouth at night and has a hx of adenoids removal and septum surgery previously, but sleep disruptions still present.   EPWORTH SLEEPINESS SCALE:  Scale:  (0)= no chance of dozing; (1)= slight chance of dozing; (2)= moderate chance of dozing; (3)= high chance of dozing  Chance  Situtation    Sitting and reading: 1    Watching TV: 2    Sitting Inactive in public: 0    As a passenger in car: 3      Lying down to rest: 3    Sitting and talking: 0    Sitting quielty after lunch: 1     In a car, stopped in traffic: 0   TOTAL SCORE:   10 out of 24  STOP BANG RISK ASSESSMENT S (snore) Have you been told that you snore?     YES   T (tired) Are you often tired, fatigued, or sleepy during the day?   YES  O (obstruction) Do you stop breathing, choke, or gasp during sleep? YES   P (pressure) Do you have or are you being treated for high blood pressure? YES   B (BMI) Is your body index greater than 35 kg/m? NO   A (age) Are you 40 years old or older? NO   N (neck) Do you have a neck circumference greater than 16 inches?   YES/NO   G (gender) Are you a female? NO   TOTAL STOP/BANG "YES" ANSWERS                                                                        For Office Use Only              Procedure Order Form     A STOP-Bang score of 2  or less is considered low risk, and a score of 5 or more is high risk for having either moderate or severe OSA. For people who score 3 or 4, doctors may need to perform further assessment to determine how likely they are to have OSA.         Current Medication: Outpatient Encounter Medications as of 01/11/2024  Medication Sig   amLODipine (NORVASC) 5 MG tablet Take 1 tablet (5 mg total) by mouth daily.   cholecalciferol (VITAMIN D) 1000 units tablet Take 1 tablet (1,000 Units total) by mouth daily.   spironolactone (ALDACTONE) 50 MG tablet    [DISCONTINUED] predniSONE (DELTASONE) 10 MG tablet Use per dose pack   [DISCONTINUED] tirzepatide (ZEPBOUND) 5 MG/0.5ML Pen Inject 5 mg into the skin once a week.   No facility-administered encounter medications on file as of 01/11/2024.    Surgical History: Past Surgical History:  Procedure Laterality Date   abdominal laproscopy     ADENOIDECTOMY     twice   TONSILLECTOMY Bilateral 1987   WISDOM TOOTH EXTRACTION  2009    Medical History: Past Medical History:  Diagnosis Date   Endometriosis    Migraine    PCOS (polycystic ovarian syndrome)     Family History: Family  History  Problem Relation Age of Onset   Breast cancer Mother    Cancer Father    Ovarian cancer Paternal Grandmother    Cancer Paternal Grandmother    Prostate cancer Paternal Grandfather    Cancer Paternal Grandfather     Social History   Socioeconomic History   Marital status: Married    Spouse name: Not on file   Number of children: Not on file   Years of education: Not on file   Highest education level: Not on file  Occupational History   Not on file  Tobacco Use   Smoking status: Never   Smokeless tobacco: Never  Vaping Use   Vaping status: Never Used  Substance and Sexual Activity   Alcohol use: Yes    Comment: social   Drug use: No   Sexual activity: Yes  Other Topics Concern   Not on file  Social History Narrative   Not on file   Social Drivers of Health   Financial Resource Strain: Not on file  Food Insecurity: Not on file  Transportation Needs: Not on file  Physical Activity: Not on file  Stress: Not on file  Social Connections: Not on file  Intimate Partner Violence: Not on file      Review of Systems  Constitutional:  Positive for fatigue. Negative for chills and unexpected weight change.  HENT:  Negative for congestion, postnasal drip, rhinorrhea, sneezing and sore throat.   Eyes:  Negative for redness.  Respiratory:  Negative for cough, chest tightness and shortness of breath.   Cardiovascular:  Negative for chest pain and palpitations.  Gastrointestinal:  Negative for diarrhea, nausea and vomiting.  Genitourinary:  Negative for dysuria and frequency.  Musculoskeletal:  Negative for arthralgias, back pain, joint swelling and neck pain.  Skin:  Negative for rash.  Neurological:  Negative for tremors and numbness.  Hematological:  Negative for adenopathy. Does not bruise/bleed easily.  Psychiatric/Behavioral:  Positive for sleep disturbance. Negative for behavioral problems (Depression) and suicidal ideas. The patient is not nervous/anxious.      Vital Signs: BP 115/70   Pulse 89   Temp 98.4 F (36.9 C)   Resp 16   Ht 5\' 5"  (1.651 m)  Wt 181 lb 6.4 oz (82.3 kg)   SpO2 99%   BMI 30.19 kg/m    Physical Exam Vitals and nursing note reviewed.  Constitutional:      Appearance: Normal appearance.  HENT:     Head: Normocephalic and atraumatic.     Nose: Nose normal.     Mouth/Throat:     Mouth: Mucous membranes are moist.     Pharynx: No posterior oropharyngeal erythema.  Eyes:     Extraocular Movements: Extraocular movements intact.     Pupils: Pupils are equal, round, and reactive to light.  Cardiovascular:     Rate and Rhythm: Normal rate and regular rhythm.     Pulses: Normal pulses.     Heart sounds: Normal heart sounds.  Pulmonary:     Effort: Pulmonary effort is normal.     Breath sounds: Normal breath sounds.  Abdominal:     General: Abdomen is flat.  Musculoskeletal:        General: Normal range of motion.     Cervical back: Normal range of motion.  Skin:    General: Skin is warm and dry.  Neurological:     General: No focal deficit present.     Mental Status: She is alert.  Psychiatric:        Mood and Affect: Mood normal.        Behavior: Behavior normal.        Thought Content: Thought content normal.        Judgment: Judgment normal.        Assessment/Plan: 1. Hypersomnia (Primary) Will reorder PSG for in lab sleep study due to snoring, gasping, frequent nighttime awakenings, poorly refreshing sleep, and elevated BMI. Would benefit from in-lab sleep study for most accurate testing results  2. Benign hypertension Continue current medication  3. Hereditary hemochromatosis (HCC) Followed by hematology and GI  4. Obesity (BMI 30.0-34.9) Will order in lab PSG Obesity Counseling: Had a lengthy discussion regarding patients BMI and weight issues. Patient was instructed on portion control as well as increased activity. Also discussed caloric restrictions with trying to maintain intake  less than 2000 Kcal. Discussions were made in accordance with the 5As of weight management. Simple actions such as not eating late and if able to, taking a walk is suggested.    General Counseling: Analei verbalizes understanding of the findings of todays visit and agrees with plan of treatment. I have discussed any further diagnostic evaluation that may be needed or ordered today. We also reviewed her medications today. she has been encouraged to call the office with any questions or concerns that should arise related to todays visit.    No orders of the defined types were placed in this encounter.   No orders of the defined types were placed in this encounter.   This patient was seen by Lynn Ito, PA-C in collaboration with Dr. Beverely Risen as a part of collaborative care agreement.   Total time spent:30 Minutes Time spent includes review of chart, medications, test results, and follow up plan with the patient.      Dr Lyndon Code Internal medicine

## 2024-01-16 ENCOUNTER — Telehealth: Payer: Self-pay | Admitting: Oncology

## 2024-01-16 ENCOUNTER — Ambulatory Visit (HOSPITAL_BASED_OUTPATIENT_CLINIC_OR_DEPARTMENT_OTHER)

## 2024-01-16 NOTE — Telephone Encounter (Signed)
 Patient called to reschedule lab only visit due to being out of town. Appointment rescheduled as requested

## 2024-01-17 ENCOUNTER — Inpatient Hospital Stay

## 2024-01-24 ENCOUNTER — Ambulatory Visit (HOSPITAL_BASED_OUTPATIENT_CLINIC_OR_DEPARTMENT_OTHER)

## 2024-01-30 ENCOUNTER — Ambulatory Visit (HOSPITAL_BASED_OUTPATIENT_CLINIC_OR_DEPARTMENT_OTHER)

## 2024-01-30 ENCOUNTER — Other Ambulatory Visit: Payer: BC Managed Care – PPO

## 2024-01-31 ENCOUNTER — Inpatient Hospital Stay

## 2024-02-01 ENCOUNTER — Ambulatory Visit: Payer: BC Managed Care – PPO | Admitting: Oncology

## 2024-02-06 ENCOUNTER — Ambulatory Visit (HOSPITAL_BASED_OUTPATIENT_CLINIC_OR_DEPARTMENT_OTHER)

## 2024-02-07 ENCOUNTER — Ambulatory Visit: Admitting: Urology

## 2024-02-07 ENCOUNTER — Encounter: Payer: Self-pay | Admitting: Urology

## 2024-02-07 VITALS — BP 148/95 | HR 112 | Ht 65.0 in | Wt 180.0 lb

## 2024-02-07 DIAGNOSIS — N390 Urinary tract infection, site not specified: Secondary | ICD-10-CM

## 2024-02-07 DIAGNOSIS — K5909 Other constipation: Secondary | ICD-10-CM

## 2024-02-07 DIAGNOSIS — N3010 Interstitial cystitis (chronic) without hematuria: Secondary | ICD-10-CM

## 2024-02-07 DIAGNOSIS — N3289 Other specified disorders of bladder: Secondary | ICD-10-CM

## 2024-02-07 DIAGNOSIS — Z8742 Personal history of other diseases of the female genital tract: Secondary | ICD-10-CM | POA: Diagnosis not present

## 2024-02-07 LAB — URINALYSIS, COMPLETE
Bilirubin, UA: NEGATIVE
Leukocytes,UA: NEGATIVE
Nitrite, UA: NEGATIVE
RBC, UA: NEGATIVE
Specific Gravity, UA: 1.015 (ref 1.005–1.030)
Urobilinogen, Ur: 2 mg/dL — ABNORMAL HIGH (ref 0.2–1.0)
pH, UA: 7 (ref 5.0–7.5)

## 2024-02-07 LAB — MICROSCOPIC EXAMINATION: Epithelial Cells (non renal): >10 /HPF — AB (ref 0–10)

## 2024-02-07 LAB — BLADDER SCAN AMB NON-IMAGING: Scan Result: 0

## 2024-02-07 NOTE — Patient Instructions (Signed)
 For UTI prevention take cranberry tablets, probiotic, D-Mannose daily. Eating Plan for Interstitial Cystitis Interstitial cystitis (IC) is a long-term (chronic) condition that can cause pain and pressure near your bladder. It can also make you have to pee urgently and often. Symptoms may come and go. Certain foods may trigger your symptoms. Learning what foods bother you can help you come up with an eating plan to manage IC. What are tips for following this plan? You may want to work with an expert in healthy eating called a dietitian. They can help you make an eating plan by doing an elimination diet. This diet involves: Making a list of foods that you think trigger your symptoms. You may also want to include foods that often cause symptoms in other people with IC. It may take a few months to find out which foods bother you. Taking those foods out of your diet for about a month. After that month, you can try to have the foods again one at a time to see which ones cause symptoms. Reading food labels Once you know which foods trigger your symptoms, you can avoid them. But it's still a good idea to read food labels. Some foods that cause your symptoms may be ingredients in other foods. These foods may include: Soy. Worcestershire sauce. Vinegar. Alcohol. Artificial sweeteners. Monosodium glutamate. Other triggers may include: Chili peppers. Tomato products. Citrus fruits, flavors, or juices. Shopping Shopping can be hard if many foods trigger your IC. Bring a list of the foods you can't eat with you when you go to the store. You can get an app for your phone that lets you know which foods are safer and which ones you may want to avoid. You can find the app at the [employer] website: ic-network.com Meal planning Plan your meals based on the results of your elimination diet. If you haven't done the diet yet, plan meals based on IC food lists. These lists may be given to you by your health care  provider or dietitian. The lists tell you which foods are least and most likely to cause symptoms. Avoid certain types of food when you go out to eat. These may include: Pizza. [place] food. [place] food. [place] food. General information Do not eat large portions. Drink lots of fluids with your meals. Do not eat foods that are high in sugar, salt, or saturated fat. Choose whole fruits instead of juice. Eat a colorful variety of vegetables. Find ways to manage stress. Get enough exercise. What foods should I eat? For people with IC, the best diet is a balanced one. This means it includes things from all the food groups. Even if you have to avoid certain foods, there are still lots of healthy choices in each group. Below are some foods that may be safest for you to eat: Fruits Bananas. Blueberries and blueberry juice. Melons. Pears. Apples. Dates. Prunes. Raisins. Apricots. Vegetables Asparagus. Avocado. Celery. Beets. Bell peppers. Black olives. Broccoli. Brussels sprouts. Cabbage. Carrots. Cauliflower. Cucumber. Eggplant. Green beans. Potatoes. Radishes. Spinach. Squash. Turnips. Zucchini. Mushrooms. Peas. Grains Oats. Rice. Bran. Oatmeal. Whole wheat bread. Meats and other proteins Beef. Fish and other seafood. Eggs. Nuts. Peanut butter. Pork. Poultry. Lamb. Garbanzo beans. Pinto beans. Dairy Whole or low-fat milk. American, mozzarella, mild cheddar, feta, ricotta, and cream cheeses. The items listed above may not be all the foods and drinks you can have. Talk to a dietitian to learn more. What foods should I avoid? You should avoid any foods that  cause symptoms. It's also a good idea to avoid foods that often cause symptoms in many people with IC. These foods include: Fruits Citrus fruits, such as lemons, limes, oranges, and grapefruit. Cranberries. Strawberries. Pineapple. Kiwi. Vegetables Chili peppers. Onions. Sauerkraut. Tomato and tomato products. Vanessa General. Grains You don't need  to avoid any type of grain unless it causes symptoms. Meats and other proteins Precooked or cured meats, such as sausages or meat loaves. Soy products. Dairy Chocolate ice cream. Processed cheese. Yogurt. Drinks Alcohol. Chocolate drinks. Coffee. Cranberry juice. Fizzy drinks. Black, green, or herbal tea. Tomato juice. Sports drinks. The items listed above may not be all the foods and drinks you should avoid. Talk to a dietitian to learn more. This information is not intended to replace advice given to you by your health care provider. Make sure you discuss any questions you have with your health care provider. Document Revised: 01/19/2023 Document Reviewed: 01/19/2023 Elsevier Patient Education  2024 [employer].

## 2024-02-07 NOTE — Progress Notes (Signed)
 Marcelle Overlie Plume,acting as a scribe for Vanna Scotland, MD.,have documented all relevant documentation on the behalf of Vanna Scotland, MD,as directed by  Vanna Scotland, MD while in the presence of Vanna Scotland, MD.  02/07/24 1:34 PM   Jennifer Barron 09-06-1981 [id number]  Referring provider: Carlean Jews, PA-C [address] [place],  [place] [address]  Chief Complaint  Patient presents with   Establish Care   Recurrent UTI    HPI:  43 year old female presents for further evaluation of recurrent UTIs. Notably, she has a personal history of hemochromatosis and is followed by Dr. Cathie Hoops in oncology.   In January, she had a positive urine dip showing Serratia, which was resistant to Keflex, Augmentin, and Nitrofurantoin.   She reports having UTI symptoms that did not improve with antibiotics. She was treated with Macrobid and Augmentin, to which the bacteria was resistant. She has been experiencing more frequent UTIs despite following recommended hygiene practices. She has been using online visits for treatment due to difficulty in getting appointments.   She describes current symptoms of burning and pressure, even when not urinating.  She is having this currently even though her urine is negative.    She has a history of PCOS with irregular menstrual cycles and has been on birth control and had an IUD in the past.   She reports constipation, worsened over the past year, particularly while on a GLP-1 medication, which she discontinued due to side effects.  Results for orders placed or performed in visit on 02/07/24  Microscopic Examination   Urine  Result Value Ref Range   WBC, UA 0-5 0 - 5 /hpf   RBC, Urine 0-2 0 - 2 /hpf   Epithelial Cells (non renal) >10 (A) 0 - 10 /hpf   Mucus, UA Present (A) Not Estab.   Bacteria, UA Many (A) None seen/Few  Urinalysis, Complete  Result Value Ref Range   Specific Gravity, UA 1.015 1.005 - 1.030   pH, UA 7.0 5.0 - 7.5   Color,  UA Yellow Yellow   Appearance Ur Clear Clear   Leukocytes,UA Negative Negative   Protein,UA 2+ (A) Negative/Trace   Glucose, UA Trace (A) Negative   Ketones, UA Trace (A) Negative   RBC, UA Negative Negative   Bilirubin, UA Negative Negative   Urobilinogen, Ur 2.0 (H) 0.2 - 1.0 mg/dL   Nitrite, UA Negative Negative   Microscopic Examination See below:   Bladder Scan (Post Void Residual) in office  Result Value Ref Range   Scan Result 0     PMH: Past Medical History:  Diagnosis Date   Endometriosis    Migraine    PCOS (polycystic ovarian syndrome)     Surgical History: Past Surgical History:  Procedure Laterality Date   abdominal laproscopy     ADENOIDECTOMY     twice   TONSILLECTOMY Bilateral 1987   WISDOM TOOTH EXTRACTION  2009    Home Medications:  Allergies as of 02/07/2024   No Known Allergies      Medication List        Accurate as of February 07, 2024  1:34 PM. If you have any questions, ask your nurse or doctor.          amLODipine 5 MG tablet Commonly known as: NORVASC Take 1 tablet (5 mg total) by mouth daily.   cholecalciferol 1000 units tablet Commonly known as: VITAMIN D Take 1 tablet (1,000 Units total) by mouth daily.   spironolactone 50  MG tablet Commonly known as: ALDACTONE        Family History: Family History  Problem Relation Age of Onset   Breast cancer Mother    Cancer Father    Ovarian cancer Paternal Grandmother    Cancer Paternal Grandmother    Prostate cancer Paternal Grandfather    Cancer Paternal Grandfather     Social History:  reports that she has never smoked. She has never used smokeless tobacco. She reports current alcohol use. She reports that she does not use drugs.   Physical Exam: BP (!) 148/95   Pulse (!) 112   Ht 5\' 5"  (1.651 m)   Wt 180 lb (81.6 kg)   BMI 29.95 kg/m   Constitutional:  Alert and oriented, No acute distress. HEENT: [place] AT, moist mucus membranes.  Trachea midline, no  masses. Neurologic: Grossly intact, no focal deficits, moving all 4 extremities. Psychiatric: Normal mood and affect.   Assessment & Plan:    1. Bladder pain syndrome - She has a history of 1 documented UTI, with an infection in January caused by Serratia, resistant to Keflex, Augmentin, and Nitrofurantoin.  - Current urinalysis shows no infection, suggesting possible misdiagnosis or inappropriate treatment in the past - Consideration of interstitial cystitis (IC) as a differential diagnosis due to symptoms of burning and pressure without infection. -  - Patient education on IC provided, including potential dietary triggers and stress as exacerbating factors.  - Recommended follow-up for accurate diagnosis during symptomatic episodes.  2. Constipation - She reports chronic constipation, worsened by previous GLP-1 use.  - Advised to increase water intake and consider a stool softener like Colace for maintenance.  - Suggested use of Miralax during episodes of constipation.  - Discussed the potential impact of constipation on bladder symptoms, possibly contributing to bladder pain syndrome.  3. Polycystic Ovary Syndrome (PCOS) - She has a history of PCOS with irregular menstrual cycles.  - No current treatment changes discussed, but noted as a potential factor in hormonal fluctuations affecting bladder symptoms.  4. Possible Interstitial Cystitis (IC) - Symptoms of burning and pressure without infection, and potential cyclic nature related to menstrual cycle, suggest IC.  - Provided patient with information on IC, including lifestyle modifications and dietary considerations. - Recommended monitoring symptoms and follow-up for further evaluation if symptoms persist or worsen.   Return if symptoms worsen or fail to improve.  I have reviewed the above documentation for accuracy and completeness, and I agree with the above.   Dustin Gimenez, MD   [facility] [address], [address] [place], [place] [address] [phone]  I spent 48 total minutes on the day of the encounter including pre-visit review of the medical record, face-to-face time with the patient, and post visit ordering of labs/imaging/tests.

## 2024-02-14 ENCOUNTER — Inpatient Hospital Stay

## 2024-02-15 ENCOUNTER — Ambulatory Visit: Admitting: Physician Assistant

## 2024-02-21 ENCOUNTER — Inpatient Hospital Stay

## 2024-02-28 ENCOUNTER — Telehealth: Payer: Self-pay | Admitting: Physician Assistant

## 2024-02-28 NOTE — Telephone Encounter (Signed)
 Patient scheduled 04/04/24 to p/u equipment from [facility] for home sleep study-Toni

## 2024-02-29 ENCOUNTER — Inpatient Hospital Stay

## 2024-03-01 ENCOUNTER — Other Ambulatory Visit: Payer: Self-pay | Admitting: Physician Assistant

## 2024-03-07 ENCOUNTER — Other Ambulatory Visit: Payer: Self-pay | Admitting: Physician Assistant

## 2024-03-07 ENCOUNTER — Ambulatory Visit: Admitting: Physician Assistant

## 2024-03-07 ENCOUNTER — Other Ambulatory Visit

## 2024-03-07 DIAGNOSIS — Z1231 Encounter for screening mammogram for malignant neoplasm of breast: Secondary | ICD-10-CM

## 2024-03-15 ENCOUNTER — Telehealth: Payer: Self-pay

## 2024-03-15 NOTE — Telephone Encounter (Signed)
 Pt last saw you on 11/22/23 and per LOS :   Labs in 2 weeks  TBD  Pt has r/s the 2-week lab appt 6 times, do we need to get her scheduled for Lab/MD/ Phleb? she has no follow up currently

## 2024-03-15 NOTE — Telephone Encounter (Signed)
 Please schedule Labs prior to MD/ phleb and notify pt of appt details .

## 2024-03-19 ENCOUNTER — Encounter: Payer: Self-pay | Admitting: Oncology

## 2024-03-19 ENCOUNTER — Inpatient Hospital Stay

## 2024-03-20 ENCOUNTER — Ambulatory Visit
Admission: RE | Admit: 2024-03-20 | Discharge: 2024-03-20 | Disposition: A | Discharge status: Home / Self Care | Source: Ambulatory Visit | Attending: Physician Assistant | Admitting: Physician Assistant

## 2024-03-20 DIAGNOSIS — Z1231 Encounter for screening mammogram for malignant neoplasm of breast: Secondary | ICD-10-CM | POA: Insufficient documentation

## 2024-03-21 ENCOUNTER — Inpatient Hospital Stay

## 2024-03-25 ENCOUNTER — Inpatient Hospital Stay

## 2024-03-25 ENCOUNTER — Other Ambulatory Visit: Payer: Self-pay | Admitting: Physician Assistant

## 2024-03-25 ENCOUNTER — Encounter: Payer: Self-pay | Admitting: Physician Assistant

## 2024-03-25 DIAGNOSIS — R928 Other abnormal and inconclusive findings on diagnostic imaging of breast: Secondary | ICD-10-CM

## 2024-03-26 ENCOUNTER — Inpatient Hospital Stay: Admitting: Oncology

## 2024-03-26 ENCOUNTER — Inpatient Hospital Stay

## 2024-03-27 ENCOUNTER — Ambulatory Visit
Admission: RE | Admit: 2024-03-27 | Discharge: 2024-03-27 | Disposition: A | Discharge status: Home / Self Care | Source: Ambulatory Visit | Attending: Physician Assistant | Admitting: Physician Assistant

## 2024-03-27 DIAGNOSIS — R928 Other abnormal and inconclusive findings on diagnostic imaging of breast: Secondary | ICD-10-CM | POA: Diagnosis not present

## 2024-03-27 DIAGNOSIS — R92321 Mammographic fibroglandular density, right breast: Secondary | ICD-10-CM | POA: Diagnosis not present

## 2024-03-27 DIAGNOSIS — R921 Mammographic calcification found on diagnostic imaging of breast: Secondary | ICD-10-CM | POA: Diagnosis not present

## 2024-03-28 ENCOUNTER — Other Ambulatory Visit: Payer: Self-pay | Admitting: Student

## 2024-03-28 DIAGNOSIS — R928 Other abnormal and inconclusive findings on diagnostic imaging of breast: Secondary | ICD-10-CM

## 2024-03-29 ENCOUNTER — Encounter: Payer: BC Managed Care – PPO | Admitting: Physician Assistant

## 2024-03-29 ENCOUNTER — Encounter

## 2024-04-02 ENCOUNTER — Ambulatory Visit
Admission: RE | Admit: 2024-04-02 | Discharge: 2024-04-02 | Disposition: A | Discharge status: Home / Self Care | Source: Ambulatory Visit | Attending: Physician Assistant | Admitting: Physician Assistant

## 2024-04-02 DIAGNOSIS — R928 Other abnormal and inconclusive findings on diagnostic imaging of breast: Secondary | ICD-10-CM | POA: Insufficient documentation

## 2024-04-02 DIAGNOSIS — R921 Mammographic calcification found on diagnostic imaging of breast: Secondary | ICD-10-CM | POA: Diagnosis not present

## 2024-04-02 DIAGNOSIS — N6489 Other specified disorders of breast: Secondary | ICD-10-CM | POA: Insufficient documentation

## 2024-04-02 HISTORY — PX: BREAST BIOPSY: SHX20

## 2024-04-02 MED ORDER — LIDOCAINE-EPINEPHRINE 1 %-1:100000 IJ SOLN
20.0000 mL | Freq: Once | INTRAMUSCULAR | Status: AC
Start: 2024-04-02 — End: 2024-04-02
  Administered 2024-04-02: 20 mL
  Filled 2024-04-02: qty 20

## 2024-04-02 MED ORDER — LIDOCAINE 1 % OPTIME INJ - NO CHARGE
5.0000 mL | Freq: Once | INTRAMUSCULAR | Status: AC
Start: 2024-04-02 — End: 2024-04-02
  Administered 2024-04-02: 5 mL
  Filled 2024-04-02: qty 6

## 2024-04-03 LAB — SURGICAL PATHOLOGY

## 2024-04-04 DIAGNOSIS — D225 Melanocytic nevi of trunk: Secondary | ICD-10-CM | POA: Diagnosis not present

## 2024-04-04 DIAGNOSIS — D2261 Melanocytic nevi of right upper limb, including shoulder: Secondary | ICD-10-CM | POA: Diagnosis not present

## 2024-04-04 DIAGNOSIS — D2262 Melanocytic nevi of left upper limb, including shoulder: Secondary | ICD-10-CM | POA: Diagnosis not present

## 2024-04-04 DIAGNOSIS — D2272 Melanocytic nevi of left lower limb, including hip: Secondary | ICD-10-CM | POA: Diagnosis not present

## 2024-04-05 ENCOUNTER — Ambulatory Visit (INDEPENDENT_AMBULATORY_CARE_PROVIDER_SITE_OTHER): Payer: BC Managed Care – PPO | Admitting: Physician Assistant

## 2024-04-05 ENCOUNTER — Encounter: Payer: Self-pay | Admitting: Physician Assistant

## 2024-04-05 VITALS — BP 128/80 | HR 85 | Temp 97.8°F | Resp 16 | Ht 65.0 in | Wt 192.0 lb

## 2024-04-05 DIAGNOSIS — R300 Dysuria: Secondary | ICD-10-CM

## 2024-04-05 DIAGNOSIS — Z0001 Encounter for general adult medical examination with abnormal findings: Secondary | ICD-10-CM

## 2024-04-05 DIAGNOSIS — I10 Essential (primary) hypertension: Secondary | ICD-10-CM | POA: Diagnosis not present

## 2024-04-05 DIAGNOSIS — E66811 Obesity, class 1: Secondary | ICD-10-CM

## 2024-04-05 DIAGNOSIS — K219 Gastro-esophageal reflux disease without esophagitis: Secondary | ICD-10-CM

## 2024-04-05 DIAGNOSIS — Z1329 Encounter for screening for other suspected endocrine disorder: Secondary | ICD-10-CM

## 2024-04-05 DIAGNOSIS — Z566 Other physical and mental strain related to work: Secondary | ICD-10-CM

## 2024-04-05 DIAGNOSIS — E559 Vitamin D deficiency, unspecified: Secondary | ICD-10-CM

## 2024-04-05 DIAGNOSIS — K760 Fatty (change of) liver, not elsewhere classified: Secondary | ICD-10-CM

## 2024-04-05 DIAGNOSIS — E538 Deficiency of other specified B group vitamins: Secondary | ICD-10-CM

## 2024-04-05 DIAGNOSIS — R5383 Other fatigue: Secondary | ICD-10-CM

## 2024-04-05 MED ORDER — BUPROPION HCL ER (XL) 150 MG PO TB24: 150.0000 mg | ORAL_TABLET | Freq: Every day | ORAL | 2 refills | Status: AC

## 2024-04-05 NOTE — Progress Notes (Signed)
 [facility] [address] [place], [place] [address]  Internal MEDICINE  Office Visit Note  Patient Name: Jennifer Barron  [id number]  [id number]  Date of Service: 04/18/2024  Chief Complaint  Patient presents with   Annual Exam   Hypertension     HPI Pt is here for routine health maintenance examination -recently had breast biopsy negative, back to annual screenings -BP stable -some work stress, but is managing. Some sleep interruptions with puppies -HST in a few weeks -did see urology and was told to call anytime she has UTI symptoms so they can track this -went to dermatology for skin check yesterday -due for labs -would like to try wellbutrin  to help wt since rising again off of zepbound  now and may help work stress as well -needs new GI referral  Current Medication: Outpatient Encounter Medications as of 04/05/2024  Medication Sig   amLODipine  (NORVASC ) 5 MG tablet TAKE ONE TABLET BY MOUTH EVERY DAY   buPROPion  (WELLBUTRIN  XL) 150 MG 24 hr tablet Take 1 tablet (150 mg total) by mouth daily.   cholecalciferol (VITAMIN D ) 1000 units tablet Take 1 tablet (1,000 Units total) by mouth daily.   spironolactone (ALDACTONE) 50 MG tablet    No facility-administered encounter medications on file as of 04/05/2024.    Surgical History: Past Surgical History:  Procedure Laterality Date   abdominal laproscopy     ADENOIDECTOMY     twice   BREAST BIOPSY Right 04/02/2024   MM RT BREAST BX W LOC DEV 1ST LESION IMAGE BX SPEC STEREO GUIDE 04/02/2024 [facility]   TONSILLECTOMY Bilateral 1987   WISDOM TOOTH EXTRACTION  2009    Medical History: Past Medical History:  Diagnosis Date   Endometriosis    Hypertension    Migraine    PCOS (polycystic ovarian syndrome)     Family History: Family History  Problem Relation Age of Onset   Breast cancer Mother    Cancer Father    Ovarian cancer Paternal Grandmother    Cancer Paternal Grandmother    Prostate  cancer Paternal Grandfather    Cancer Paternal Grandfather       Review of Systems  Constitutional:  Positive for fatigue. Negative for chills and unexpected weight change.  HENT:  Negative for congestion, postnasal drip, rhinorrhea, sneezing and sore throat.   Eyes:  Negative for redness.  Respiratory:  Negative for cough, chest tightness and shortness of breath.   Cardiovascular:  Negative for chest pain and palpitations.  Gastrointestinal:  Negative for diarrhea, nausea and vomiting.  Genitourinary:  Negative for dysuria and frequency.  Musculoskeletal:  Negative for arthralgias, back pain, joint swelling and neck pain.  Skin:  Negative for rash.  Neurological:  Negative for tremors and numbness.  Hematological:  Negative for adenopathy. Does not bruise/bleed easily.  Psychiatric/Behavioral:  Positive for sleep disturbance. Negative for behavioral problems (Depression) and suicidal ideas. The patient is nervous/anxious.      Vital Signs: BP 128/80   Pulse 85   Temp 97.8 F (36.6 C)   Resp 16   Ht 5' 5 (1.651 m)   Wt 192 lb (87.1 kg)   LMP 03/13/2024   SpO2 97%   BMI 31.95 kg/m    Physical Exam Vitals and nursing note reviewed.  Constitutional:      Appearance: Normal appearance.  HENT:     Head: Normocephalic and atraumatic.     Nose: Nose normal.     Mouth/Throat:     Mouth: Mucous membranes  are moist.     Pharynx: No posterior oropharyngeal erythema.   Eyes:     Extraocular Movements: Extraocular movements intact.     Pupils: Pupils are equal, round, and reactive to light.    Cardiovascular:     Rate and Rhythm: Normal rate and regular rhythm.     Pulses: Normal pulses.     Heart sounds: Normal heart sounds.  Pulmonary:     Effort: Pulmonary effort is normal.     Breath sounds: Normal breath sounds.  Abdominal:     General: Abdomen is flat.     Tenderness: There is no abdominal tenderness.   Musculoskeletal:        General: Normal range of motion.      Cervical back: Normal range of motion.   Skin:    General: Skin is warm and dry.   Neurological:     General: No focal deficit present.     Mental Status: She is alert.   Psychiatric:        Mood and Affect: Mood normal.        Behavior: Behavior normal.        Thought Content: Thought content normal.        Judgment: Judgment normal.      LABS: Recent Results (from the past 2160 hours)  Bladder Scan (Post Void Residual) in office     Status: None   Collection Time: 02/07/24  8:25 AM  Result Value Ref Range   Scan Result 0   Urinalysis, Complete     Status: Abnormal   Collection Time: 02/07/24  8:39 AM  Result Value Ref Range   Specific Gravity, UA 1.015 1.005 - 1.030   pH, UA 7.0 5.0 - 7.5   Color, UA Yellow Yellow   Appearance Ur Clear Clear   Leukocytes,UA Negative Negative   Protein,UA 2+ (A) Negative/Trace   Glucose, UA Trace (A) Negative   Ketones, UA Trace (A) Negative   RBC, UA Negative Negative   Bilirubin, UA Negative Negative   Urobilinogen, Ur 2.0 (H) 0.2 - 1.0 mg/dL   Nitrite, UA Negative Negative   Microscopic Examination See below:   Microscopic Examination     Status: Abnormal   Collection Time: 02/07/24  8:39 AM   Urine  Result Value Ref Range   WBC, UA 0-5 0 - 5 /hpf   RBC, Urine 0-2 0 - 2 /hpf   Epithelial Cells (non renal) >10 (A) 0 - 10 /hpf   Mucus, UA Present (A) Not Estab.   Bacteria, UA Many (A) None seen/Few  Surgical pathology     Status: None   Collection Time: 04/02/24 12:00 AM  Result Value Ref Range   SURGICAL PATHOLOGY      SURGICAL PATHOLOGY [facility] [address], [address] [place], [place] [address] Telephone [phone] or [phone] Fax [phone]  REPORT OF SURGICAL PATHOLOGY   Accession #: [id number] Patient Name: Jennifer Barron Visit # : [id number]  MRN: [id number] Physician: Correne Krabbe DOB/Age [birth year] (Age: 43) Gender: F Collected Date: 04/02/2024 Received  Date: 04/02/2024  FINAL DIAGNOSIS       1. 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 Reed on 04/03/2024 at 8:49 AM.      DATE SIGNED OUT: 04/03/2024 ELECTRONIC SIGNATURE : Picklesimer Md, Fred , Sports administrator, Electronic Signature  MICROSCOPIC DESCRIPTION  CASE COMMENTS STAINS USED IN  DIAGNOSIS: H&E-2 H&E-3 H&E-4 H&E H&E-2 H&E-3 H&E-4 H&E    CLINICAL HISTORY  SPECIMEN(S) OBTAINED 1. Breast, right, needle core biopsy, Upper Outer Posterior Depth, Ribbon Clip  SPECIMEN COMMENTS: 1. TIF: 8:03 AM, CIT less than 3 mins; indeterminate calcifications, somewhat rod like in appearance SPECIMEN CLINICAL INFORMATION: 1. Fibroadenomatoid, vascular, secretory, malignancy    Gross Description 1. Received in formalin labeled with the patient's name and right breast stereo biopsy calcs upper outer posterior depth are multiple yellow-tan needle core biopsy fragments measuring 2.4 x 2.0 x 0.5 cm in aggregate. The cores containing microcalcifications are submitted in 1A. The remaining cores are submitted in 1B.      CIT: Less than 3 minutes      TIF: 0803 on 04/02/2024      (WC 04/02/2024)        Report signed out from the following location(s) [place] [address] [place], [place] [address] CLIA #: [id number]  [facility] [address] [place], [place] [address] CLIA #: [id number]   UA/M w/rflx Culture, Routine     Status: Abnormal   Collection Time: 04/05/24 12:03 PM   Specimen: Urine   Urine Please run cultu  Result Value Ref Range   Specific Gravity, UA      <=1.005 (A) 1.005 - 1.030   pH, UA 7.0 5.0 - 7.5   Color, UA Yellow Yellow    Appearance Ur Clear Clear   Leukocytes,UA Negative Negative   Protein,UA Negative Negative/Trace   Glucose, UA Negative Negative   Ketones, UA Negative Negative   RBC, UA Negative Negative   Bilirubin, UA Negative Negative   Urobilinogen, Ur 0.2 0.2 - 1.0 mg/dL   Nitrite, UA Negative Negative   Microscopic Examination Comment     Comment: Microscopic follows if indicated.   Microscopic Examination See below:     Comment: Microscopic was indicated and was performed.   Urinalysis Reflex Comment     Comment: This specimen will not reflex to a Urine Culture.  Microscopic Examination     Status: None   Collection Time: 04/05/24 12:03 PM   Urine Please run cultu  Result Value Ref Range   WBC, UA None seen 0 - 5 /hpf   RBC, Urine None seen 0 - 2 /hpf   Epithelial Cells (non renal) None seen 0 - 10 /hpf   Casts None seen None seen /lpf   Bacteria, UA None seen None seen/Few       Assessment/Plan: 1. Encounter for general adult medical examination with abnormal findings CPE performed, Labs ordered, mammo and PAP UTD  2. Benign hypertension Stable, continue current medication  3. Stress at work Will start wellbutrin  - buPROPion  (WELLBUTRIN  XL) 150 MG 24 hr tablet; Take 1 tablet (150 mg total) by mouth daily.  Dispense: 30 tablet; Refill: 2  4. Hereditary hemochromatosis (HCC) - Ambulatory referral to Gastroenterology  5. Fatty liver - Ambulatory referral to Gastroenterology - Lipid panel  6. Gastroesophageal reflux disease, unspecified whether esophagitis present - Ambulatory referral to Gastroenterology  7. Thyroid  disorder screen - TSH + free T4  8. B12 deficiency - B12 and Folate Panel  9. Vitamin D  deficiency - VITAMIN D  25 Hydroxy (Vit-D Deficiency,  Fractures)  10. Other fatigue - B12 and Folate Panel - TSH + free T4 - Lipid panel - Comprehensive metabolic panel with GFR - VITAMIN D  25 Hydroxy (Vit-D Deficiency, Fractures)  11. Obesity (BMI  30.0-34.9) - buPROPion  (WELLBUTRIN  XL) 150 MG 24 hr tablet; Take 1 tablet (150 mg total) by mouth daily.  Dispense: 30 tablet; Refill: 2  12. Dysuria (Primary) - UA/M w/rflx Culture, Routine   General Counseling: Carlyann verbalizes understanding of the findings of todays visit and agrees with plan of treatment. I have discussed any further diagnostic evaluation that may be needed or ordered today. We also reviewed her medications today. she has been encouraged to call the office with any questions or concerns that should arise related to todays visit.    Counseling:    Orders Placed This Encounter  Procedures   Microscopic Examination   UA/M w/rflx Culture, Routine   B12 and Folate Panel   TSH + free T4   Lipid panel   Comprehensive metabolic panel with GFR   VITAMIN D  25 Hydroxy (Vit-D Deficiency, Fractures)   Ambulatory referral to Gastroenterology    Meds ordered this encounter  Medications   buPROPion  (WELLBUTRIN  XL) 150 MG 24 hr tablet    Sig: Take 1 tablet (150 mg total) by mouth daily.    Dispense:  30 tablet    Refill:  2    This patient was seen by Tinnie Pro, PA-C in collaboration with Dr. Sigrid Bathe as a part of collaborative care agreement.  Total time spent:35 Minutes  Time spent includes review of chart, medications, test results, and follow up plan with the patient.     Sigrid CHRISTELLA Bathe, MD  Internal Medicine

## 2024-04-06 LAB — MICROSCOPIC EXAMINATION
Bacteria, UA: NONE SEEN
Casts: NONE SEEN /LPF
Epithelial Cells (non renal): NONE SEEN /HPF (ref 0–10)
RBC, Urine: NONE SEEN /HPF (ref 0–2)
WBC, UA: NONE SEEN /HPF (ref 0–5)

## 2024-04-06 LAB — UA/M W/RFLX CULTURE, ROUTINE
Bilirubin, UA: NEGATIVE
Glucose, UA: NEGATIVE
Ketones, UA: NEGATIVE
Leukocytes,UA: NEGATIVE
Nitrite, UA: NEGATIVE
Protein,UA: NEGATIVE
RBC, UA: NEGATIVE
Specific Gravity, UA: <=1.005 — AB (ref 1.005–1.030)
Urobilinogen, Ur: 0.2 mg/dL (ref 0.2–1.0)
pH, UA: 7 (ref 5.0–7.5)

## 2024-04-08 ENCOUNTER — Encounter

## 2024-04-09 ENCOUNTER — Telehealth: Payer: Self-pay | Admitting: Physician Assistant

## 2024-04-09 NOTE — Telephone Encounter (Signed)
 Awaiting 04/05/24 office notes for GI referral-Toni

## 2024-04-22 ENCOUNTER — Inpatient Hospital Stay

## 2024-04-23 ENCOUNTER — Inpatient Hospital Stay: Admitting: Oncology

## 2024-04-23 ENCOUNTER — Inpatient Hospital Stay

## 2024-04-25 ENCOUNTER — Telehealth: Payer: Self-pay | Admitting: Physician Assistant

## 2024-04-25 NOTE — Telephone Encounter (Signed)
 GI referral faxed to [facility] per patient request; [phone]. Notified patient. Gave pt telephone [phone]

## 2024-05-10 ENCOUNTER — Ambulatory Visit: Admitting: Physician Assistant

## 2024-05-22 ENCOUNTER — Inpatient Hospital Stay

## 2024-05-23 ENCOUNTER — Inpatient Hospital Stay

## 2024-05-23 ENCOUNTER — Inpatient Hospital Stay: Admitting: Oncology

## 2024-05-28 ENCOUNTER — Other Ambulatory Visit: Payer: Self-pay | Admitting: Physician Assistant

## 2024-06-27 ENCOUNTER — Ambulatory Visit: Admitting: Physician Assistant

## 2024-07-05 ENCOUNTER — Inpatient Hospital Stay

## 2024-07-08 ENCOUNTER — Inpatient Hospital Stay

## 2024-07-08 ENCOUNTER — Telehealth: Payer: Self-pay | Admitting: Oncology

## 2024-07-08 NOTE — Telephone Encounter (Signed)
 Pt called to r/s appst (lab today/MD/phleb tomorrow) she stated that her husband has covid.  Appts have been r/s to October. These appts have already been r/s as shown below.   r/s from 9/15_AE r/s from 9/12- BC  r/s from 6/30- BC  r/s from 6/2_AE r/s from 5/29--MKW r/s from 5/27--MKW r/s from 5/15_AE r/s from 5/8-AE r/s from 4/29-AE r/s from 4/9-AE r/s from 3/26- BC  pt r/s appt from 3/19-AE pt called to r/s appt-AE 2 weeks lab ( cbc iron tibc ferritin LFT hepatitis) YU-AE

## 2024-07-09 ENCOUNTER — Inpatient Hospital Stay

## 2024-07-09 ENCOUNTER — Inpatient Hospital Stay: Admitting: Oncology

## 2024-07-24 DIAGNOSIS — H16223 Keratoconjunctivitis sicca, not specified as Sjogren's, bilateral: Secondary | ICD-10-CM | POA: Diagnosis not present

## 2024-07-24 DIAGNOSIS — H0288B Meibomian gland dysfunction left eye, upper and lower eyelids: Secondary | ICD-10-CM | POA: Diagnosis not present

## 2024-07-24 DIAGNOSIS — H0288A Meibomian gland dysfunction right eye, upper and lower eyelids: Secondary | ICD-10-CM | POA: Diagnosis not present

## 2024-07-24 DIAGNOSIS — L718 Other rosacea: Secondary | ICD-10-CM | POA: Diagnosis not present

## 2024-07-30 ENCOUNTER — Other Ambulatory Visit

## 2024-07-31 ENCOUNTER — Ambulatory Visit: Admitting: Oncology

## 2024-07-31 ENCOUNTER — Encounter

## 2024-08-19 ENCOUNTER — Ambulatory Visit: Admitting: Physician Assistant

## 2024-08-21 ENCOUNTER — Telehealth: Payer: Self-pay

## 2024-08-21 ENCOUNTER — Other Ambulatory Visit: Payer: Self-pay

## 2024-08-21 DIAGNOSIS — R112 Nausea with vomiting, unspecified: Secondary | ICD-10-CM | POA: Diagnosis not present

## 2024-08-21 DIAGNOSIS — U071 COVID-19: Secondary | ICD-10-CM | POA: Diagnosis not present

## 2024-08-21 DIAGNOSIS — J029 Acute pharyngitis, unspecified: Secondary | ICD-10-CM | POA: Diagnosis not present

## 2024-08-21 DIAGNOSIS — R509 Fever, unspecified: Secondary | ICD-10-CM | POA: Diagnosis not present

## 2024-08-21 MED ORDER — AZITHROMYCIN 250 MG PO TABS: ORAL_TABLET | ORAL | 0 refills | Status: DC

## 2024-08-21 NOTE — Telephone Encounter (Signed)
 Pt advised that as per lauren sent zpak and take otc and rest and fluids and if not feeling better need appt

## 2024-08-22 ENCOUNTER — Ambulatory Visit: Admitting: Physician Assistant

## 2024-08-28 ENCOUNTER — Other Ambulatory Visit: Payer: Self-pay

## 2024-08-28 ENCOUNTER — Emergency Department

## 2024-08-28 ENCOUNTER — Emergency Department
Admission: EM | Admit: 2024-08-28 | Discharge: 2024-08-28 | Disposition: A | Discharge status: Home / Self Care | Attending: Emergency Medicine | Admitting: Emergency Medicine

## 2024-08-28 ENCOUNTER — Encounter: Payer: Self-pay | Admitting: Emergency Medicine

## 2024-08-28 DIAGNOSIS — R109 Unspecified abdominal pain: Secondary | ICD-10-CM

## 2024-08-28 DIAGNOSIS — E871 Hypo-osmolality and hyponatremia: Secondary | ICD-10-CM | POA: Insufficient documentation

## 2024-08-28 DIAGNOSIS — R10A1 Flank pain, right side: Secondary | ICD-10-CM | POA: Diagnosis not present

## 2024-08-28 DIAGNOSIS — R300 Dysuria: Secondary | ICD-10-CM | POA: Insufficient documentation

## 2024-08-28 DIAGNOSIS — K429 Umbilical hernia without obstruction or gangrene: Secondary | ICD-10-CM | POA: Diagnosis not present

## 2024-08-28 DIAGNOSIS — I10 Essential (primary) hypertension: Secondary | ICD-10-CM | POA: Diagnosis not present

## 2024-08-28 DIAGNOSIS — R1031 Right lower quadrant pain: Secondary | ICD-10-CM | POA: Diagnosis not present

## 2024-08-28 DIAGNOSIS — E876 Hypokalemia: Secondary | ICD-10-CM | POA: Insufficient documentation

## 2024-08-28 LAB — CBC WITH DIFFERENTIAL/PLATELET
Abs Immature Granulocytes: 0.02 K/uL (ref 0.00–0.07)
Basophils Absolute: 0 K/uL (ref 0.0–0.1)
Basophils Relative: 0 %
Eosinophils Absolute: 0 K/uL (ref 0.0–0.5)
Eosinophils Relative: 0 %
HCT: 49.4 % — ABNORMAL HIGH (ref 36.0–46.0)
Hemoglobin: 17.2 g/dL — ABNORMAL HIGH (ref 12.0–15.0)
Immature Granulocytes: 0 %
Lymphocytes Relative: 18 %
Lymphs Abs: 0.9 K/uL (ref 0.7–4.0)
MCH: 32.8 pg (ref 26.0–34.0)
MCHC: 34.8 g/dL (ref 30.0–36.0)
MCV: 94.1 fL (ref 80.0–100.0)
Monocytes Absolute: 0.5 K/uL (ref 0.1–1.0)
Monocytes Relative: 9 %
Neutro Abs: 3.5 K/uL (ref 1.7–7.7)
Neutrophils Relative %: 73 %
Platelets: 273 K/uL (ref 150–400)
RBC: 5.25 MIL/uL — ABNORMAL HIGH (ref 3.87–5.11)
RDW: 14.4 % (ref 11.5–15.5)
WBC: 4.9 K/uL (ref 4.0–10.5)
nRBC: 0 % (ref 0.0–0.2)

## 2024-08-28 LAB — URINALYSIS, ROUTINE W REFLEX MICROSCOPIC
Bilirubin Urine: NEGATIVE
Glucose, UA: NEGATIVE mg/dL
Hgb urine dipstick: NEGATIVE
Ketones, ur: NEGATIVE mg/dL
Leukocytes,Ua: NEGATIVE
Nitrite: NEGATIVE
Protein, ur: 100 mg/dL — AB
Specific Gravity, Urine: 1.01 (ref 1.005–1.030)
pH: 8 (ref 5.0–8.0)

## 2024-08-28 LAB — BASIC METABOLIC PANEL WITH GFR
Anion gap: 12 (ref 5–15)
BUN: 5 mg/dL — ABNORMAL LOW (ref 6–20)
CO2: 23 mmol/L (ref 22–32)
Calcium: 8 mg/dL — ABNORMAL LOW (ref 8.9–10.3)
Chloride: 100 mmol/L (ref 98–111)
Creatinine, Ser: 0.79 mg/dL (ref 0.44–1.00)
GFR, Estimated: >60 mL/min (ref >60)
Glucose, Bld: 94 mg/dL (ref 70–99)
Potassium: 3.6 mmol/L (ref 3.5–5.1)
Sodium: 135 mmol/L (ref 135–145)

## 2024-08-28 LAB — COMPREHENSIVE METABOLIC PANEL WITH GFR
ALT: 153 U/L — ABNORMAL HIGH (ref 0–44)
AST: 279 U/L — ABNORMAL HIGH (ref 15–41)
Albumin: 4.1 g/dL (ref 3.5–5.0)
Alkaline Phosphatase: 114 U/L (ref 38–126)
Anion gap: 19 — ABNORMAL HIGH (ref 5–15)
BUN: 6 mg/dL (ref 6–20)
CO2: 24 mmol/L (ref 22–32)
Calcium: 10 mg/dL (ref 8.9–10.3)
Chloride: 91 mmol/L — ABNORMAL LOW (ref 98–111)
Creatinine, Ser: 0.89 mg/dL (ref 0.44–1.00)
GFR, Estimated: >60 mL/min (ref >60)
Glucose, Bld: 115 mg/dL — ABNORMAL HIGH (ref 70–99)
Potassium: 3.3 mmol/L — ABNORMAL LOW (ref 3.5–5.1)
Sodium: 134 mmol/L — ABNORMAL LOW (ref 135–145)
Total Bilirubin: 5 mg/dL — ABNORMAL HIGH (ref 0.0–1.2)
Total Protein: 8.4 g/dL — ABNORMAL HIGH (ref 6.5–8.1)

## 2024-08-28 LAB — PREGNANCY, URINE: Preg Test, Ur: NEGATIVE

## 2024-08-28 LAB — LIPASE, BLOOD: Lipase: 33 U/L (ref 11–51)

## 2024-08-28 MED ORDER — SODIUM CHLORIDE 0.9 % IV BOLUS
1000.0000 mL | Freq: Once | INTRAVENOUS | Status: AC
  Administered 2024-08-28: 1000 mL via INTRAVENOUS

## 2024-08-28 MED ORDER — METOCLOPRAMIDE HCL 10 MG PO TABS: 10.0000 mg | ORAL_TABLET | Freq: Three times a day (TID) | ORAL | 0 refills | Status: DC | PRN

## 2024-08-28 MED ORDER — KETOROLAC TROMETHAMINE 15 MG/ML IJ SOLN
15.0000 mg | Freq: Once | INTRAMUSCULAR | Status: DC | PRN
  Filled 2024-08-28: qty 1

## 2024-08-28 MED ORDER — ACETAMINOPHEN 500 MG PO TABS
1000.0000 mg | ORAL_TABLET | Freq: Once | ORAL | Status: AC
  Administered 2024-08-28: 1000 mg via ORAL
  Filled 2024-08-28: qty 2

## 2024-08-28 MED ORDER — LORAZEPAM 2 MG/ML IJ SOLN
1.0000 mg | Freq: Once | INTRAMUSCULAR | Status: AC
  Administered 2024-08-28: 1 mg via INTRAVENOUS
  Filled 2024-08-28: qty 1

## 2024-08-28 MED ORDER — CEPHALEXIN 500 MG PO CAPS: 500.0000 mg | ORAL_CAPSULE | Freq: Three times a day (TID) | ORAL | 0 refills | Status: AC

## 2024-08-28 MED ORDER — SODIUM CHLORIDE 0.9 % IV SOLN
1.0000 g | Freq: Once | INTRAVENOUS | Status: AC
  Administered 2024-08-28: 1 g via INTRAVENOUS
  Filled 2024-08-28: qty 10

## 2024-08-28 NOTE — Discharge Instructions (Addendum)
 Please take your antibiotic as prescribed for its entire course.  Please take your nausea medication as needed as written.  As we discussed please drink plenty of fluids.  Return to the emergency department for any symptom concerning to yourself.

## 2024-08-28 NOTE — ED Provider Notes (Signed)
-----------------------------------------   11:07 AM on 08/28/2024 ----------------------------------------- Patient care assumed from Dr. Cyrena.  Patient had an anion gap elevation as well as chronic LFT elevation.  Patient admits to daily alcohol use however has not been drinking over the last several days due to nausea and vomiting.  Possibly indicating more alcoholic ketoacidosis.  On my examination patient has no right upper quadrant tenderness.  Her urinalysis did result showing a urinary tract infection.  Urine culture has been sent and the patient received IV Rocephin.  Given the patient's anion gap elevation I ordered an additional liter of fluid for total of 2 L.  Repeat chemistry after 2 L of fluids is much improved anion gap is closed, now 12. CT negative for acute issue.  Patient is feeling much better.  We will discharge on antibiotics, have the patient follow-up with her doctor.   Dorothyann Drivers, MD 08/28/24 1110

## 2024-08-28 NOTE — ED Provider Notes (Signed)
 [facility] Provider Note    Event Date/Time   First MD Initiated Contact with Patient 08/28/24 [phone]     (approximate)   History   Abdominal Pain   HPI  Jennifer Barron is a 43 y.o. female   Past medical history of endometriosis, PCOS, hypertension and migraines who presents emergency department with right flank pain radiating to the right lower quadrant starting last night.  Mild dysuria as well.  Was diagnosed with COVID infection last week and has residual viral URI symptoms ongoing, unchanged.  No chest pain or shortness of breath.  No fever.  No history of kidney stones.   External Medical Documents Reviewed: Prior outpatient notes      Physical Exam   Triage Vital Signs: ED Triage Vitals  Encounter Vitals Group     BP 08/28/24 0600 (!) 143/103     Girls Systolic BP Percentile --      Girls Diastolic BP Percentile --      Boys Systolic BP Percentile --      Boys Diastolic BP Percentile --      Pulse Rate 08/28/24 0600 (!) 112     Resp 08/28/24 0600 18     Temp 08/28/24 0600 98.4 F (36.9 C)     Temp Source 08/28/24 0600 Oral     SpO2 08/28/24 0600 100 %     Weight 08/28/24 0556 185 lb (83.9 kg)     Height 08/28/24 0556 5' 3 (1.6 m)     Head Circumference --      Peak Flow --      Pain Score 08/28/24 0556 8     Pain Loc --      Pain Education --      Exclude from Growth Chart --     Most recent vital signs: Vitals:   08/28/24 0600  BP: (!) 143/103  Pulse: (!) 112  Resp: 18  Temp: 98.4 F (36.9 C)  SpO2: 100%    General: Awake, no distress.  CV:  Good peripheral perfusion.  Resp:  Normal effort.  Abd:  No distention.  Other:  Awake alert pleasant woman in no acute distress overall nontoxic appearance.  Mildly tachycardic and high blood pressure, otherwise vital signs within normal limits, afebrile.  Breathing comfortably on room air no hypoxemia no respiratory distress.  Right CVA tenderness, but otherwise soft  benign abdominal exam deep palpation all quadrants nonfocal nonperitoneal.   ED Results / Procedures / Treatments   Labs (all labs ordered are listed, but only abnormal results are displayed) Labs Reviewed  CBC WITH DIFFERENTIAL/PLATELET - Abnormal; Notable for the following components:      Result Value   RBC 5.25 (*)    Hemoglobin 17.2 (*)    HCT 49.4 (*)    All other components within normal limits  COMPREHENSIVE METABOLIC PANEL WITH GFR  LIPASE, BLOOD  URINALYSIS, ROUTINE W REFLEX MICROSCOPIC  POC URINE PREG, ED     I ordered and reviewed the above labs they are notable for no leukocytosis, high H&H may be related to hemoconcentration in the setting of dehydration   PROCEDURES:  Critical Care performed: No  Procedures   MEDICATIONS ORDERED IN ED: Medications  ketorolac (TORADOL) 15 MG/ML injection 15 mg (has no administration in time range)  acetaminophen (TYLENOL) tablet 1,000 mg (has no administration in time range)  sodium chloride 0.9 % bolus 1,000 mL (1,000 mLs Intravenous New Bag/Given 08/28/24 0636)     IMPRESSION /  MDM / ASSESSMENT AND PLAN / ED COURSE  I reviewed the triage vital signs and the nursing notes.                                Patient's presentation is most consistent with acute presentation with potential threat to life or bodily function.  Differential diagnosis includes, but is not limited to, renal colic, urolithiasis, urinary tract infection or pyelonephritis, lumbar strain, intra-abdominal infection like appendicitis, ovarian cyst or torsion, considered but less likely vascular emergencies, cardiopulmonary emergencies   The patient is on the cardiac monitor to evaluate for evidence of arrhythmia and/or significant heart rate changes.  MDM:     New onset right flank pain with radiation to the lower abdomen concerning for renal colic, CVA tenderness, some dysuria as well may represent urinary tract infection.  Has had poor p.o.  intake recently in the setting of her ongoing COVID symptoms, dark urine, likely some element of dehydration will give IV fluid.  CT of the abdomen pelvis given tenderness to the right flank and abdomen to assess for kidney stones, other intra-abdominal infections or obstructions.  Has been dealing with ongoing COVID symptoms.  These are not acutely worsened and she does not appear in respiratory distress, no hypoxemia, no worsening productive cough, so I doubt sepsis or worsening respiratory infection.  No shortness of breath or chest pain to suggest cardiopulmonary emergencies like ACS or PE.  Disposition pending the results of evaluation as above and reassessment.       FINAL CLINICAL IMPRESSION(S) / ED DIAGNOSES   Final diagnoses:  Right flank pain  Right sided abdominal pain  Dysuria     Rx / DC Orders   ED Discharge Orders     None        Note:  This document was prepared using Dragon voice recognition software and may include unintentional dictation errors.    Cyrena Mylar, MD 08/28/24 [phone]

## 2024-08-28 NOTE — ED Triage Notes (Signed)
 Patient ambulatory to triage with steady gait, without difficulty or distress noted; +COVID last Wed; st since yesterday having dark urine and rt lower abd pain radiating into flank

## 2024-08-29 LAB — URINE CULTURE: Culture: <10000 — AB

## 2024-08-30 ENCOUNTER — Ambulatory Visit (INDEPENDENT_AMBULATORY_CARE_PROVIDER_SITE_OTHER): Admitting: Physician Assistant

## 2024-08-30 ENCOUNTER — Encounter: Payer: Self-pay | Admitting: Physician Assistant

## 2024-08-30 VITALS — BP 132/96 | HR 87 | Temp 98.3°F | Resp 16 | Ht 65.0 in | Wt 179.8 lb

## 2024-08-30 DIAGNOSIS — I10 Essential (primary) hypertension: Secondary | ICD-10-CM | POA: Diagnosis not present

## 2024-08-30 DIAGNOSIS — K760 Fatty (change of) liver, not elsewhere classified: Secondary | ICD-10-CM

## 2024-08-30 DIAGNOSIS — E559 Vitamin D deficiency, unspecified: Secondary | ICD-10-CM

## 2024-08-30 DIAGNOSIS — R5383 Other fatigue: Secondary | ICD-10-CM

## 2024-08-30 DIAGNOSIS — Z1329 Encounter for screening for other suspected endocrine disorder: Secondary | ICD-10-CM | POA: Diagnosis not present

## 2024-08-30 DIAGNOSIS — E538 Deficiency of other specified B group vitamins: Secondary | ICD-10-CM

## 2024-08-30 DIAGNOSIS — E785 Hyperlipidemia, unspecified: Secondary | ICD-10-CM | POA: Diagnosis not present

## 2024-08-30 NOTE — Progress Notes (Signed)
 [facility] [address] [place], [place] [address]  Internal MEDICINE  Office Visit Note  Patient Name: Jennifer Barron  [id number]  [id number]  Date of Service: 08/30/2024  Chief Complaint  Patient presents with   Follow-up    ED F/U   Hypertension    HPI Pt is here for ED follow up -last week had razor throat and dx with covid. Ended up goin to ED on Wed when she started having right flank pain and SOB. -ED dx UTI and treated with IV rocephin  and then oral keflex  TID for 10 days -still hasn't been able to get HST due to timing of appts  -has been trying to get records transferred from [facility] to [facility] is who she is going to be seeing. No appt yet while waiting on records. She is going to call to check on this -Some blood in stool since ED visit, had not really been eating or having BM for awhile and wonders if irritations with straining due to this. Bright red on TP no dark tarry stools -Never a true smoker -not on BP meds right now due to recent illness  -will check labs  Current Medication: Outpatient Encounter Medications as of 08/30/2024  Medication Sig   amLODipine  (NORVASC ) 5 MG tablet TAKE 1 TABLET BY MOUTH DAILY   buPROPion  (WELLBUTRIN  XL) 150 MG 24 hr tablet Take 1 tablet (150 mg total) by mouth daily.   cephALEXin  (KEFLEX ) 500 MG capsule Take 1 capsule (500 mg total) by mouth 3 (three) times daily.   cholecalciferol (VITAMIN D ) 1000 units tablet Take 1 tablet (1,000 Units total) by mouth daily.   metoCLOPramide  (REGLAN ) 10 MG tablet Take 1 tablet (10 mg total) by mouth every 8 (eight) hours as needed for nausea.   spironolactone (ALDACTONE) 50 MG tablet    [DISCONTINUED] azithromycin  (ZITHROMAX ) 250 MG tablet Use as directed for 5 days   No facility-administered encounter medications on file as of 08/30/2024.    Surgical History: Past Surgical History:  Procedure Laterality Date   abdominal laproscopy     ADENOIDECTOMY     twice    BREAST BIOPSY Right 04/02/2024   MM RT BREAST BX W LOC DEV 1ST LESION IMAGE BX SPEC STEREO GUIDE 04/02/2024 [facility]   TONSILLECTOMY Bilateral 1987   WISDOM TOOTH EXTRACTION  2009    Medical History: Past Medical History:  Diagnosis Date   Endometriosis    Hypertension    Migraine    PCOS (polycystic ovarian syndrome)     Family History: Family History  Problem Relation Age of Onset   Breast cancer Mother    Cancer Father    Ovarian cancer Paternal Grandmother    Cancer Paternal Grandmother    Prostate cancer Paternal Grandfather    Cancer Paternal Grandfather     Social History   Socioeconomic History   Marital status: Married    Spouse name: Not on file   Number of children: Not on file   Years of education: Not on file   Highest education level: Not on file  Occupational History   Not on file  Tobacco Use   Smoking status: Never   Smokeless tobacco: Never  Vaping Use   Vaping status: Never Used  Substance and Sexual Activity   Alcohol use: Yes    Comment: social   Drug use: No   Sexual activity: Yes  Other Topics Concern   Not on file  Social History Narrative   Not  on file   Social Drivers of Health   Financial Resource Strain: Not on file  Food Insecurity: Not on file  Transportation Needs: Not on file  Physical Activity: Not on file  Stress: Not on file  Social Connections: Not on file  Intimate Partner Violence: Not on file      Review of Systems  Constitutional:  Positive for fatigue. Negative for chills and unexpected weight change.  HENT:  Negative for congestion, postnasal drip, rhinorrhea, sneezing and sore throat.   Eyes:  Negative for redness.  Respiratory:  Positive for cough. Negative for chest tightness and shortness of breath.   Cardiovascular:  Negative for chest pain and palpitations.  Gastrointestinal:  Positive for anal bleeding and constipation. Negative for blood in stool, diarrhea, nausea and vomiting.   Genitourinary:  Negative for frequency.  Musculoskeletal:  Positive for myalgias. Negative for arthralgias, back pain, joint swelling and neck pain.  Skin:  Negative for rash.  Neurological:  Negative for tremors and numbness.  Hematological:  Negative for adenopathy. Does not bruise/bleed easily.  Psychiatric/Behavioral:  Positive for sleep disturbance. Negative for behavioral problems (Depression) and suicidal ideas. The patient is nervous/anxious.     Vital Signs: BP (!) 132/96   Pulse 87   Temp 98.3 F (36.8 C)   Resp 16   Ht 5' 5 (1.651 m)   Wt 179 lb 12.8 oz (81.6 kg)   LMP 08/17/2024 (Exact Date)   SpO2 97%   BMI 29.92 kg/m    Physical Exam Vitals and nursing note reviewed.  Constitutional:      Appearance: Normal appearance.  HENT:     Head: Normocephalic and atraumatic.  Eyes:     Extraocular Movements: Extraocular movements intact.  Cardiovascular:     Rate and Rhythm: Normal rate and regular rhythm.     Pulses: Normal pulses.     Heart sounds: Normal heart sounds.  Pulmonary:     Effort: Pulmonary effort is normal.     Breath sounds: Normal breath sounds.  Skin:    General: Skin is warm and dry.  Neurological:     General: No focal deficit present.     Mental Status: She is alert.  Psychiatric:        Behavior: Behavior normal.        Thought Content: Thought content normal.        Judgment: Judgment normal.        Assessment/Plan: 1. Benign hypertension (Primary) Will restart medications and monitor  2. Hereditary hemochromatosis Will be seeing hematology for labs in a few weeks, will also be seeing GI soon once records transferred  3. Fatty liver will be seeing GI soon once records transferred, will recheck LFTs in meantime  4. Hyperlipidemia, unspecified hyperlipidemia type - Lipid Panel With LDL/HDL Ratio  5. Vitamin D  deficiency - VITAMIN D  25 Hydroxy (Vit-D Deficiency, Fractures)  6. B12 deficiency - B12 and Folate Panel  7.  Thyroid  disorder screen - TSH + free T4  8. Other fatigue - Comprehensive metabolic panel with GFR - CBC w/Diff/Platelet - Lipid Panel With LDL/HDL Ratio - TSH + free T4 - B12 and Folate Panel - VITAMIN D  25 Hydroxy (Vit-D Deficiency, Fractures)   General Counseling: Jennifer Barron verbalizes understanding of the findings of todays visit and agrees with plan of treatment. I have discussed any further diagnostic evaluation that may be needed or ordered today. We also reviewed her medications today. she has been encouraged to call the office with any  questions or concerns that should arise related to todays visit.    No orders of the defined types were placed in this encounter.   No orders of the defined types were placed in this encounter.   This patient was seen by Tinnie Pro, PA-C in collaboration with Dr. Sigrid Bathe as a part of collaborative care agreement.   Total time spent:30 Minutes Time spent includes review of chart, medications, test results, and follow up plan with the patient.      Dr Fozia M Khan Internal medicine

## 2024-08-31 ENCOUNTER — Encounter: Payer: Self-pay | Admitting: Physician Assistant

## 2024-08-31 LAB — COMPREHENSIVE METABOLIC PANEL WITH GFR
ALT: 261 IU/L — ABNORMAL HIGH (ref 0–32)
AST: 539 IU/L (ref 0–40)
Albumin: 4.4 g/dL (ref 3.9–4.9)
Alkaline Phosphatase: 118 IU/L — ABNORMAL HIGH (ref 41–116)
BUN/Creatinine Ratio: 9 (ref 9–23)
BUN: 8 mg/dL (ref 6–24)
Bilirubin Total: 2.8 mg/dL — ABNORMAL HIGH (ref 0.0–1.2)
CO2: 23 mmol/L (ref 20–29)
Calcium: 9.9 mg/dL (ref 8.7–10.2)
Chloride: 96 mmol/L (ref 96–106)
Creatinine, Ser: 0.86 mg/dL (ref 0.57–1.00)
Globulin, Total: 2.6 g/dL (ref 1.5–4.5)
Glucose: 84 mg/dL (ref 70–99)
Potassium: 3.8 mmol/L (ref 3.5–5.2)
Sodium: 136 mmol/L (ref 134–144)
Total Protein: 7 g/dL (ref 6.0–8.5)
eGFR: 86 mL/min/1.73 (ref >59)

## 2024-08-31 LAB — LIPID PANEL WITH LDL/HDL RATIO
Cholesterol, Total: 238 mg/dL — ABNORMAL HIGH (ref 100–199)
HDL: 40 mg/dL (ref >39)
LDL Chol Calc (NIH): 164 mg/dL — ABNORMAL HIGH (ref 0–99)
LDL/HDL Ratio: 4.1 ratio — ABNORMAL HIGH (ref 0.0–3.2)
Triglycerides: 186 mg/dL — ABNORMAL HIGH (ref 0–149)
VLDL Cholesterol Cal: 34 mg/dL (ref 5–40)

## 2024-08-31 LAB — CBC WITH DIFFERENTIAL/PLATELET
Basophils Absolute: 0 x10E3/uL (ref 0.0–0.2)
Basos: 1 %
EOS (ABSOLUTE): 0.1 x10E3/uL (ref 0.0–0.4)
Eos: 1 %
Hematocrit: 44.3 % (ref 34.0–46.6)
Hemoglobin: 14.7 g/dL (ref 11.1–15.9)
Immature Grans (Abs): 0 x10E3/uL (ref 0.0–0.1)
Immature Granulocytes: 0 %
Lymphocytes Absolute: 0.9 x10E3/uL (ref 0.7–3.1)
Lymphs: 17 %
MCH: 33.2 pg — ABNORMAL HIGH (ref 26.6–33.0)
MCHC: 33.2 g/dL (ref 31.5–35.7)
MCV: 100 fL — ABNORMAL HIGH (ref 79–97)
Monocytes Absolute: 0.6 x10E3/uL (ref 0.1–0.9)
Monocytes: 11 %
Neutrophils Absolute: 3.9 x10E3/uL (ref 1.4–7.0)
Neutrophils: 70 %
Platelets: 263 x10E3/uL (ref 150–450)
RBC: 4.43 x10E6/uL (ref 3.77–5.28)
RDW: 14.7 % (ref 11.7–15.4)
WBC: 5.5 x10E3/uL (ref 3.4–10.8)

## 2024-08-31 LAB — TSH+FREE T4
Free T4: 1.98 ng/dL — ABNORMAL HIGH (ref 0.82–1.77)
TSH: 1.28 u[IU]/mL (ref 0.450–4.500)

## 2024-08-31 LAB — B12 AND FOLATE PANEL
Folate: 4.2 ng/mL (ref >3.0)
Vitamin B-12: >2000 pg/mL — ABNORMAL HIGH (ref 232–1245)

## 2024-08-31 LAB — VITAMIN D 25 HYDROXY (VIT D DEFICIENCY, FRACTURES): Vit D, 25-Hydroxy: 42.5 ng/mL (ref 30.0–100.0)

## 2024-09-04 ENCOUNTER — Other Ambulatory Visit: Payer: Self-pay

## 2024-09-04 MED ORDER — LACTULOSE 10 GM/15ML PO SOLN: 10.0000 g | Freq: Two times a day (BID) | ORAL | 0 refills | Status: DC

## 2024-09-05 ENCOUNTER — Encounter: Payer: Self-pay | Admitting: Physician Assistant

## 2024-09-12 ENCOUNTER — Encounter: Payer: Self-pay | Admitting: Physician Assistant

## 2024-09-12 ENCOUNTER — Other Ambulatory Visit: Payer: Self-pay | Admitting: Physician Assistant

## 2024-09-13 ENCOUNTER — Inpatient Hospital Stay: Attending: Physician Assistant

## 2024-09-13 ENCOUNTER — Other Ambulatory Visit: Payer: Self-pay | Admitting: Physician Assistant

## 2024-09-13 MED ORDER — METOCLOPRAMIDE HCL 10 MG PO TABS: 10.0000 mg | ORAL_TABLET | Freq: Three times a day (TID) | ORAL | 0 refills | Status: DC | PRN

## 2024-09-16 ENCOUNTER — Other Ambulatory Visit: Payer: Self-pay

## 2024-09-17 ENCOUNTER — Inpatient Hospital Stay: Admitting: Oncology

## 2024-09-17 ENCOUNTER — Inpatient Hospital Stay

## 2024-09-24 ENCOUNTER — Other Ambulatory Visit: Payer: Self-pay | Admitting: Physician Assistant

## 2024-10-04 ENCOUNTER — Emergency Department

## 2024-10-04 ENCOUNTER — Emergency Department
Admission: EM | Admit: 2024-10-04 | Discharge: 2024-10-04 | Disposition: A | Discharge status: Home / Self Care | Attending: Emergency Medicine | Admitting: Emergency Medicine

## 2024-10-04 ENCOUNTER — Other Ambulatory Visit: Payer: Self-pay

## 2024-10-04 DIAGNOSIS — R7989 Other specified abnormal findings of blood chemistry: Secondary | ICD-10-CM | POA: Insufficient documentation

## 2024-10-04 DIAGNOSIS — R0602 Shortness of breath: Secondary | ICD-10-CM | POA: Diagnosis not present

## 2024-10-04 DIAGNOSIS — F439 Reaction to severe stress, unspecified: Secondary | ICD-10-CM | POA: Insufficient documentation

## 2024-10-04 DIAGNOSIS — R42 Dizziness and giddiness: Secondary | ICD-10-CM | POA: Diagnosis not present

## 2024-10-04 DIAGNOSIS — R1110 Vomiting, unspecified: Secondary | ICD-10-CM | POA: Diagnosis not present

## 2024-10-04 DIAGNOSIS — K760 Fatty (change of) liver, not elsewhere classified: Secondary | ICD-10-CM | POA: Diagnosis not present

## 2024-10-04 DIAGNOSIS — R002 Palpitations: Secondary | ICD-10-CM | POA: Insufficient documentation

## 2024-10-04 DIAGNOSIS — R197 Diarrhea, unspecified: Secondary | ICD-10-CM | POA: Diagnosis not present

## 2024-10-04 DIAGNOSIS — R918 Other nonspecific abnormal finding of lung field: Secondary | ICD-10-CM | POA: Diagnosis not present

## 2024-10-04 DIAGNOSIS — R112 Nausea with vomiting, unspecified: Secondary | ICD-10-CM

## 2024-10-04 LAB — URINALYSIS, ROUTINE W REFLEX MICROSCOPIC
Bilirubin Urine: NEGATIVE
Glucose, UA: NEGATIVE mg/dL
Hgb urine dipstick: NEGATIVE
Ketones, ur: NEGATIVE mg/dL
Leukocytes,Ua: NEGATIVE
Nitrite: NEGATIVE
Protein, ur: NEGATIVE mg/dL
Specific Gravity, Urine: 1.003 — ABNORMAL LOW (ref 1.005–1.030)
pH: 7 (ref 5.0–8.0)

## 2024-10-04 LAB — COMPREHENSIVE METABOLIC PANEL WITH GFR
ALT: 130 U/L — ABNORMAL HIGH (ref 0–44)
AST: 311 U/L — ABNORMAL HIGH (ref 15–41)
Albumin: 4.4 g/dL (ref 3.5–5.0)
Alkaline Phosphatase: 137 U/L — ABNORMAL HIGH (ref 38–126)
Anion gap: 18 — ABNORMAL HIGH (ref 5–15)
BUN: <5 mg/dL — ABNORMAL LOW (ref 6–20)
CO2: 24 mmol/L (ref 22–32)
Calcium: 9.8 mg/dL (ref 8.9–10.3)
Chloride: 96 mmol/L — ABNORMAL LOW (ref 98–111)
Creatinine, Ser: 0.59 mg/dL (ref 0.44–1.00)
GFR, Estimated: >60 mL/min (ref >60)
Glucose, Bld: 95 mg/dL (ref 70–99)
Potassium: 4.1 mmol/L (ref 3.5–5.1)
Sodium: 138 mmol/L (ref 135–145)
Total Bilirubin: 1 mg/dL (ref 0.0–1.2)
Total Protein: 7.5 g/dL (ref 6.5–8.1)

## 2024-10-04 LAB — D-DIMER, QUANTITATIVE: D-Dimer, Quant: 1.7 ug{FEU}/mL — ABNORMAL HIGH (ref 0.00–0.50)

## 2024-10-04 LAB — CBC
HCT: 43.4 % (ref 36.0–46.0)
Hemoglobin: 15.1 g/dL — ABNORMAL HIGH (ref 12.0–15.0)
MCH: 33.4 pg (ref 26.0–34.0)
MCHC: 34.8 g/dL (ref 30.0–36.0)
MCV: 96 fL (ref 80.0–100.0)
Platelets: 274 K/uL (ref 150–400)
RBC: 4.52 MIL/uL (ref 3.87–5.11)
RDW: 14 % (ref 11.5–15.5)
WBC: 5.1 K/uL (ref 4.0–10.5)
nRBC: 0 % (ref 0.0–0.2)

## 2024-10-04 LAB — TROPONIN T, HIGH SENSITIVITY: Troponin T High Sensitivity: <15 ng/L (ref 0–19)

## 2024-10-04 LAB — RESP PANEL BY RT-PCR (RSV, FLU A&B, COVID)  RVPGX2
Influenza A by PCR: NEGATIVE
Influenza B by PCR: NEGATIVE
Resp Syncytial Virus by PCR: NEGATIVE
SARS Coronavirus 2 by RT PCR: NEGATIVE

## 2024-10-04 LAB — POC URINE PREG, ED: Preg Test, Ur: NEGATIVE

## 2024-10-04 LAB — LIPASE, BLOOD: Lipase: 40 U/L (ref 11–51)

## 2024-10-04 MED ORDER — IOHEXOL 350 MG/ML SOLN
75.0000 mL | Freq: Once | INTRAVENOUS | Status: AC | PRN
  Administered 2024-10-04: 75 mL via INTRAVENOUS

## 2024-10-04 MED ORDER — SODIUM CHLORIDE 0.9 % IV BOLUS
1000.0000 mL | Freq: Once | INTRAVENOUS | Status: AC
  Administered 2024-10-04: 1000 mL via INTRAVENOUS

## 2024-10-04 MED ORDER — ONDANSETRON HCL 4 MG/2ML IJ SOLN
4.0000 mg | Freq: Once | INTRAMUSCULAR | Status: AC
  Administered 2024-10-04: 4 mg via INTRAVENOUS
  Filled 2024-10-04: qty 2

## 2024-10-04 NOTE — Discharge Instructions (Addendum)
 Please try to decrease your alcohol intake, I have also prescribing you some Zofran as needed for nausea and vomiting, also starting you on Protonix for possible acid reflux.  Please be sure to follow-up with your primary care doctor next week to get reassessed and to repeat your liver function testing to make sure that your elevated liver function labs are improving.

## 2024-10-04 NOTE — ED Triage Notes (Signed)
 C/O vomiting x 2 days, SOB, dizziness, and heart palpitations.  AAOx3. Skin warm and dry. NAD

## 2024-10-04 NOTE — ED Provider Notes (Signed)
 Jennifer Barron Provider Note    Event Date/Time   First MD Initiated Contact with Patient 10/04/24 [phone]     (approximate)   History   No chief complaint on file.   HPI  Jennifer Barron is a 43 y.o. female with history of endometriosis, presenting with palpitations.  States has been ongoing for about a week.  Associated with shortness of breath.  No chest pain.  No cough.  Has also been having nausea vomiting diarrhea.  States that she has been unable to tolerate p.o.  Also lightheadedness.  Denies any recent trauma or falls, denies recent travel surgeries, no history of blood clots, no history of malignancies, denies unilateral calf swelling or tenderness, no hormone use.  Denies prior cardiac history.  Does note some dysuria.  No back pain.  No flank pain or abdominal pain.  Per independent history from husband, patient does drink alcohol daily, may have stopped recently due to the nausea vomiting.  Has had increased stressors, her uncle recently passed away.  He denies any tremors or shakes or seizures when patient stops drinking.  On independent chart review, she was seen by primary care in early November, at that time she was following up after she was diagnosed with a UTI.     Physical Exam   Triage Vital Signs: ED Triage Vitals  Encounter Vitals Group     BP 10/04/24 0716 (!) 143/102     Girls Systolic BP Percentile --      Girls Diastolic BP Percentile --      Boys Systolic BP Percentile --      Boys Diastolic BP Percentile --      Pulse Rate 10/04/24 0716 (!) 101     Resp 10/04/24 0716 18     Temp 10/04/24 0716 97.8 F (36.6 C)     Temp Source 10/04/24 0716 Oral     SpO2 10/04/24 0716 96 %     Weight 10/04/24 0717 179 lb 14.3 oz (81.6 kg)     Height 10/04/24 0717 5' 5 (1.651 m)     Head Circumference --      Peak Flow --      Pain Score 10/04/24 0717 0     Pain Loc --      Pain Education --      Exclude from Growth Chart --      Most recent vital signs: Vitals:   10/04/24 0738 10/04/24 0740  BP:  (!) 135/100  Pulse:  (!) 102  Resp:  20  Temp:    SpO2: 98% 96%     General: Awake, no distress.  CV:  Good peripheral perfusion.  Resp:  Normal effort.  No tachypnea or respiratory distress Abd:  No distention.  Soft nontender Other:  Dry mucous membranes, moving 4 extremities without focal weakness, no unilateral calf swelling or tenderness   ED Results / Procedures / Treatments   Labs (all labs ordered are listed, but only abnormal results are displayed) Labs Reviewed  COMPREHENSIVE METABOLIC PANEL WITH GFR - Abnormal; Notable for the following components:      Result Value   Chloride 96 (*)    BUN <5 (*)    AST 311 (*)    ALT 130 (*)    Alkaline Phosphatase 137 (*)    Anion gap 18 (*)    All other components within normal limits  CBC - Abnormal; Notable for the following components:   Hemoglobin 15.1 (*)  All other components within normal limits  URINALYSIS, ROUTINE W REFLEX MICROSCOPIC - Abnormal; Notable for the following components:   Color, Urine YELLOW (*)    APPearance CLEAR (*)    Specific Gravity, Urine 1.003 (*)    All other components within normal limits  D-DIMER, QUANTITATIVE - Abnormal; Notable for the following components:   D-Dimer, Quant 1.70 (*)    All other components within normal limits  RESP PANEL BY RT-PCR (RSV, FLU A&B, COVID)  RVPGX2  LIPASE, BLOOD  POC URINE PREG, ED  TROPONIN T, HIGH SENSITIVITY     EKG  EKG shows, sinus tachycardia, rate 105, normal QS, normal QTc, no obvious ischemic ST elevation, T wave flattening in V2, T wave inversion to V3, no prior to compare   RADIOLOGY On my independent interpretation, chest x-ray without obvious consolidation   PROCEDURES:  Critical Care performed: No  Procedures   MEDICATIONS ORDERED IN ED: Medications  sodium chloride  0.9 % bolus 1,000 mL (1,000 mLs Intravenous New Bag/Given 10/04/24 0759)   ondansetron (ZOFRAN) injection 4 mg (4 mg Intravenous Given 10/04/24 0759)  iohexol (OMNIPAQUE) 350 MG/ML injection 75 mL (75 mLs Intravenous Contrast Given 10/04/24 0919)     IMPRESSION / MDM / ASSESSMENT AND PLAN / ED COURSE  I reviewed the triage vital signs and the nursing notes.                              Differential diagnosis includes, but is not limited to, viral illness, gastroenteritis, UTI, dehydration, electrolyte derangements, atypical ACS, arrhythmia, did consider PE but patient has no other risk factors current.  Will get labs, D-dimer, EKG, troponin, chest x-ray.  Viral swab, UA.  Give her some IV fluids and Zofran.  Patient's presentation is most consistent with acute presentation with potential threat to life or bodily function.  Independent interpretation of labs and imaging below.  On reassessment patient is tolerating p.o., no longer having palpitations.  Did discuss with patient and family about imaging lab results including incidental findings.  She states that she has been drinking heavily for the last several days, last drink was yesterday.  Denies any history of withdrawal symptoms.  Did discuss with her about reducing her alcohol intake, will give her some Zofran that she can take as needed for nausea vomiting.  Will also start her on Protonix since she says the Pepcid does not help with acid reflux symptoms.  Otherwise considered but no indication for inpatient admission at this time, she is safe for outpatient management.  Instructed her to follow-up with primary care next week to get reassessed and to repeat her LFTs.  Strict return precautions given.  Discharge.  The patient is on the cardiac monitor to evaluate for evidence of arrhythmia and/or significant heart rate changes.   Clinical Course as of 10/04/24 1002  Fri Oct 04, 2024  0807 Independent review of labs, pregnancy test is negative, electrolytes not severely deranged, her LFTs are elevated, T. bili is  normal, lipase is normal, no leukocytosis.  She has no abdominal pain at this time.  On review of labs, she has had elevated LFTs in the past, this is downtrending compared to LFTs from a month ago.  Prior CT done a month ago showed acute fatty liver [TT]  0830 Resp panel by RT-PCR (RSV, Flu A&B, Covid) Anterior Nasal Swab Negative [TT]  0830 Urinalysis, Routine w reflex microscopic -Urine, Clean Catch(!) Not consistent  with UTI [TT]  0830 DG Chest 1 View IMPRESSION: Low volume film with focal opacity in the retrocardiac left base. This may be a confluence of shadows. Dedicated upright PA and lateral chest x-ray recommended to further evaluate.  Will get an upright two-view chest x-ray [TT]  0849 Troponin T High Sensitivity: <15 Negative [TT]  0858 D-Dimer, Quant(!): 1.70 Elevated, will get CT PE study. [TT]  0912 DG Chest 2 View No active cardiopulmonary disease.  [TT]  9048 CT Angio Chest PE W/Cm &/Or Wo Cm IMPRESSION: 1. No definite evidence of pulmonary embolus. 2. 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 3. Hepatic steatosis.  [TT]    Clinical Course User Index [TT] Waymond Lorelle Cummins, MD     FINAL CLINICAL IMPRESSION(S) / ED DIAGNOSES   Final diagnoses:  Elevated LFTs  Nausea and vomiting, unspecified vomiting type  Palpitations  Shortness of breath  Hepatic steatosis     Rx / DC Orders   ED Discharge Orders     None        Note:  This document was prepared using Dragon voice recognition software and may include unintentional dictation errors.    Waymond Lorelle Cummins, MD 10/04/24 [phone]

## 2024-10-11 ENCOUNTER — Telehealth: Admitting: Nurse Practitioner

## 2024-10-11 ENCOUNTER — Encounter: Payer: Self-pay | Admitting: Nurse Practitioner

## 2024-10-11 VITALS — Resp 16 | Ht 65.0 in | Wt 180.0 lb

## 2024-10-11 DIAGNOSIS — F411 Generalized anxiety disorder: Secondary | ICD-10-CM | POA: Diagnosis not present

## 2024-10-11 DIAGNOSIS — N951 Menopausal and female climacteric states: Secondary | ICD-10-CM

## 2024-10-11 DIAGNOSIS — G4709 Other insomnia: Secondary | ICD-10-CM | POA: Diagnosis not present

## 2024-10-11 DIAGNOSIS — K7581 Nonalcoholic steatohepatitis (NASH): Secondary | ICD-10-CM

## 2024-10-11 DIAGNOSIS — Z566 Other physical and mental strain related to work: Secondary | ICD-10-CM

## 2024-10-11 DIAGNOSIS — F321 Major depressive disorder, single episode, moderate: Secondary | ICD-10-CM

## 2024-10-11 MED ORDER — BELSOMRA 10 MG PO TABS: 10.0000 mg | ORAL_TABLET | Freq: Every evening | ORAL | 2 refills | Status: AC | PRN

## 2024-10-11 MED ORDER — VENLAFAXINE HCL ER 75 MG PO CP24: 75.0000 mg | ORAL_CAPSULE | Freq: Every day | ORAL | 2 refills | Status: AC

## 2024-10-11 NOTE — Progress Notes (Signed)
 [facility] [address] [place], [place] [address]  Internal MEDICINE  Telephone Visit  Patient Name: Jennifer Barron  [id number]  [id number]  Date of Service: 10/11/2024  I connected with the patient at 0945 by telephone and verified the patients identity using two identifiers.   I discussed the limitations, risks, security and privacy concerns of performing an evaluation and management service by telephone and the availability of in person appointments. I also discussed with the patient that there may be a patient responsible charge related to the service.  The patient expressed understanding and agrees to proceed.    Chief Complaint  Patient presents with   Telephone Screen    E/d f/u    Telephone Assessment    HPI Farah presents for a telehealth virtual visit for recent ED visit.  Recent death in family  Severe anxiety, wellbutrin  is not helping, stopped taking it.  Sick recently  Using alcohol as crutch Recent death in family Increased stress at work   Current Medication: Outpatient Encounter Medications as of 10/11/2024  Medication Sig   Suvorexant  (BELSOMRA ) 10 MG TABS Take 1 tablet (10 mg total) by mouth at bedtime as needed (insomnia).   venlafaxine  XR (EFFEXOR -XR) 75 MG 24 hr capsule Take 1 capsule (75 mg total) by mouth daily with breakfast.   amLODipine  (NORVASC ) 5 MG tablet TAKE 1 TABLET BY MOUTH DAILY   buPROPion  (WELLBUTRIN  XL) 150 MG 24 hr tablet Take 1 tablet (150 mg total) by mouth daily.   cephALEXin  (KEFLEX ) 500 MG capsule Take 1 capsule (500 mg total) by mouth 3 (three) times daily.   cholecalciferol (VITAMIN D ) 1000 units tablet Take 1 tablet (1,000 Units total) by mouth daily.   lactulose  (CHRONULAC ) 10 GM/15ML solution TAKE 15 MLS (10 G TOTAL) BY MOUTH 2 (TWO) TIMES DAILY.   metoCLOPramide  (REGLAN ) 10 MG tablet Take 1 tablet (10 mg total) by mouth every 8 (eight) hours as needed for nausea.   spironolactone (ALDACTONE) 50 MG tablet     No facility-administered encounter medications on file as of 10/11/2024.    Surgical History: Past Surgical History:  Procedure Laterality Date   abdominal laproscopy     ADENOIDECTOMY     twice   BREAST BIOPSY Right 04/02/2024   MM RT BREAST BX W LOC DEV 1ST LESION IMAGE BX SPEC STEREO GUIDE 04/02/2024 [facility]   TONSILLECTOMY Bilateral 1987   WISDOM TOOTH EXTRACTION  2009    Medical History: Past Medical History:  Diagnosis Date   Endometriosis    Hypertension    Migraine    PCOS (polycystic ovarian syndrome)     Family History: Family History  Problem Relation Age of Onset   Breast cancer Mother    Cancer Father    Ovarian cancer Paternal Grandmother    Cancer Paternal Grandmother    Prostate cancer Paternal Grandfather    Cancer Paternal Grandfather     Social History   Socioeconomic History   Marital status: Married    Spouse name: Not on file   Number of children: Not on file   Years of education: Not on file   Highest education level: Not on file  Occupational History   Not on file  Tobacco Use   Smoking status: Never   Smokeless tobacco: Never  Vaping Use   Vaping status: Never Used  Substance and Sexual Activity   Alcohol use: Yes    Comment: social   Drug use: No   Sexual activity: Yes  Other Topics Concern   Not on file  Social History Narrative   Not on file   Social Drivers of Health   Tobacco Use: Low Risk (10/11/2024)   Patient History    Smoking Tobacco Use: Never    Smokeless Tobacco Use: Never    Passive Exposure: Not on file  Financial Resource Strain: Not on file  Food Insecurity: Not on file  Transportation Needs: Not on file  Physical Activity: Not on file  Stress: Not on file  Social Connections: Not on file  Intimate Partner Violence: Not on file  Depression (PHQ2-9): Low Risk (04/05/2024)   Depression (PHQ2-9)    PHQ-2 Score: 0  Alcohol Screen: Low Risk (04/05/2024)   Alcohol Screen    Last Alcohol  Screening Score (AUDIT): 1  Housing: Not on file  Utilities: Not on file  Health Literacy: Not on file      Review of Systems  Constitutional:  Positive for fatigue. Negative for chills and unexpected weight change.  HENT:  Negative for congestion, postnasal drip, rhinorrhea, sneezing and sore throat.   Eyes:  Negative for redness.  Respiratory:  Positive for cough. Negative for chest tightness and shortness of breath.   Cardiovascular:  Negative for chest pain and palpitations.  Gastrointestinal:  Negative for anal bleeding, blood in stool, constipation, diarrhea, nausea and vomiting.  Genitourinary:  Negative for frequency.  Musculoskeletal:  Positive for myalgias. Negative for arthralgias, back pain, joint swelling and neck pain.  Skin:  Negative for rash.  Neurological:  Negative for tremors and numbness.  Hematological:  Negative for adenopathy. Does not bruise/bleed easily.  Psychiatric/Behavioral:  Positive for behavioral problems (Depression), decreased concentration and sleep disturbance. Negative for self-injury and suicidal ideas. The patient is nervous/anxious.     Vital Signs: Resp 16   Ht 5' 5 (1.651 m)   Wt 180 lb (81.6 kg)   LMP  (LMP Unknown)   BMI 29.95 kg/m    Observation/Objective: She is alert and oriented. No acute distress noted.     Assessment/Plan: 1. NASH (nonalcoholic steatohepatitis) (Primary) Noted, has repeat labs to get drawn.   2. Perimenopause Start venlafaxine  as prescribed.   3. Other insomnia Try belsomra  as prescribed.  - Suvorexant  (BELSOMRA ) 10 MG TABS; Take 1 tablet (10 mg total) by mouth at bedtime as needed (insomnia).  Dispense: 30 tablet; Refill: 2  4. Stress at work Noted.   5. GAD (generalized anxiety disorder) Start venlafaxine  as prescribed. Discontinue bupropion .  - venlafaxine  XR (EFFEXOR -XR) 75 MG 24 hr capsule; Take 1 capsule (75 mg total) by mouth daily with breakfast.  Dispense: 30 capsule; Refill: 2  6.  Depression, major, single episode, moderate (HCC) Start venlafaxine  as prescribed. Discontinue bupropion     General Counseling: Liah verbalizes understanding of the findings of today's phone visit and agrees with plan of treatment. I have discussed any further diagnostic evaluation that may be needed or ordered today. We also reviewed her medications today. she has been encouraged to call the office with any questions or concerns that should arise related to todays visit.  Return in about 4 weeks (around 11/08/2024) for F/U, eval new med, Aarionna Germer PCP.   No orders of the defined types were placed in this encounter.   Meds ordered this encounter  Medications   venlafaxine  XR (EFFEXOR -XR) 75 MG 24 hr capsule    Sig: Take 1 capsule (75 mg total) by mouth daily with breakfast.    Dispense:  30 capsule    Refill:  2    Fill new script today, discontinue bupropion    Suvorexant  (BELSOMRA ) 10 MG TABS    Sig: Take 1 tablet (10 mg total) by mouth at bedtime as needed (insomnia).    Dispense:  30 tablet    Refill:  2    Fill new script today, please send prior auth request if required.    Time spent:30 Minutes Time spent with patient included reviewing progress notes, labs, imaging studies, and discussing plan for follow up.  [place] Controlled Substance Database was reviewed by me for overdose risk score (ORS) if appropriate.  This patient was seen by Mardy Maxin, FNP-C in collaboration with Dr. Sigrid Bathe as a part of collaborative care agreement.  Jaspreet Hollings R. Maxin, MSN, FNP-C Internal medicine

## 2024-10-25 ENCOUNTER — Encounter: Payer: Self-pay | Admitting: Nurse Practitioner

## 2024-10-29 ENCOUNTER — Other Ambulatory Visit: Payer: Self-pay

## 2024-10-29 MED ORDER — METOCLOPRAMIDE HCL 10 MG PO TABS: 10.0000 mg | ORAL_TABLET | Freq: Three times a day (TID) | ORAL | 0 refills | Status: AC | PRN

## 2024-11-01 ENCOUNTER — Ambulatory Visit: Admitting: Physician Assistant

## 2024-11-08 ENCOUNTER — Inpatient Hospital Stay

## 2024-11-12 ENCOUNTER — Inpatient Hospital Stay

## 2024-11-12 ENCOUNTER — Inpatient Hospital Stay: Attending: Physician Assistant | Admitting: Oncology

## 2024-11-13 ENCOUNTER — Emergency Department (HOSPITAL_COMMUNITY)

## 2024-11-13 ENCOUNTER — Emergency Department (HOSPITAL_COMMUNITY)
Admission: EM | Admit: 2024-11-13 | Discharge: 2024-11-13 | Disposition: A | Discharge status: Home / Self Care | Attending: Emergency Medicine | Admitting: Emergency Medicine

## 2024-11-13 ENCOUNTER — Other Ambulatory Visit: Payer: Self-pay

## 2024-11-13 DIAGNOSIS — I10 Essential (primary) hypertension: Secondary | ICD-10-CM | POA: Diagnosis not present

## 2024-11-13 DIAGNOSIS — Z79899 Other long term (current) drug therapy: Secondary | ICD-10-CM | POA: Insufficient documentation

## 2024-11-13 DIAGNOSIS — R748 Abnormal levels of other serum enzymes: Secondary | ICD-10-CM | POA: Insufficient documentation

## 2024-11-13 DIAGNOSIS — E871 Hypo-osmolality and hyponatremia: Secondary | ICD-10-CM | POA: Insufficient documentation

## 2024-11-13 DIAGNOSIS — R799 Abnormal finding of blood chemistry, unspecified: Secondary | ICD-10-CM | POA: Diagnosis present

## 2024-11-13 DIAGNOSIS — K769 Liver disease, unspecified: Secondary | ICD-10-CM | POA: Diagnosis not present

## 2024-11-13 DIAGNOSIS — H1589 Other disorders of sclera: Secondary | ICD-10-CM | POA: Diagnosis not present

## 2024-11-13 LAB — COMPREHENSIVE METABOLIC PANEL WITH GFR
ALT: 109 U/L — ABNORMAL HIGH (ref 0–44)
AST: 307 U/L — ABNORMAL HIGH (ref 15–41)
Albumin: 3.5 g/dL (ref 3.5–5.0)
Alkaline Phosphatase: 165 U/L — ABNORMAL HIGH (ref 38–126)
Anion gap: 10 (ref 5–15)
BUN: <5 mg/dL — ABNORMAL LOW (ref 6–20)
CO2: 26 mmol/L (ref 22–32)
Calcium: 8.9 mg/dL (ref 8.9–10.3)
Chloride: 93 mmol/L — ABNORMAL LOW (ref 98–111)
Creatinine, Ser: 0.43 mg/dL — ABNORMAL LOW (ref 0.44–1.00)
GFR, Estimated: >60 mL/min
Glucose, Bld: 104 mg/dL — ABNORMAL HIGH (ref 70–99)
Potassium: 3.7 mmol/L (ref 3.5–5.1)
Sodium: 128 mmol/L — ABNORMAL LOW (ref 135–145)
Total Bilirubin: 4.4 mg/dL — ABNORMAL HIGH (ref 0.0–1.2)
Total Protein: 6.4 g/dL — ABNORMAL LOW (ref 6.5–8.1)

## 2024-11-13 LAB — CBC WITH DIFFERENTIAL/PLATELET
Abs Immature Granulocytes: 0.04 K/uL (ref 0.00–0.07)
Basophils Absolute: 0 K/uL (ref 0.0–0.1)
Basophils Relative: 0 %
Eosinophils Absolute: 0 K/uL (ref 0.0–0.5)
Eosinophils Relative: 0 %
HCT: 30.9 % — ABNORMAL LOW (ref 36.0–46.0)
Hemoglobin: 10.6 g/dL — ABNORMAL LOW (ref 12.0–15.0)
Immature Granulocytes: 1 %
Lymphocytes Relative: 11 %
Lymphs Abs: 0.8 K/uL (ref 0.7–4.0)
MCH: 34.3 pg — ABNORMAL HIGH (ref 26.0–34.0)
MCHC: 34.3 g/dL (ref 30.0–36.0)
MCV: 100 fL (ref 80.0–100.0)
Monocytes Absolute: 1.1 K/uL — ABNORMAL HIGH (ref 0.1–1.0)
Monocytes Relative: 16 %
Neutro Abs: 4.9 K/uL (ref 1.7–7.7)
Neutrophils Relative %: 72 %
Platelets: 473 K/uL — ABNORMAL HIGH (ref 150–400)
RBC: 3.09 MIL/uL — ABNORMAL LOW (ref 3.87–5.11)
RDW: 14.6 % (ref 11.5–15.5)
WBC: 6.9 K/uL (ref 4.0–10.5)
nRBC: 0 % (ref 0.0–0.2)

## 2024-11-13 LAB — HCG, SERUM, QUALITATIVE: Preg, Serum: NEGATIVE

## 2024-11-13 LAB — URINALYSIS, ROUTINE W REFLEX MICROSCOPIC
Bilirubin Urine: NEGATIVE
Glucose, UA: NEGATIVE mg/dL
Hgb urine dipstick: NEGATIVE
Ketones, ur: NEGATIVE mg/dL
Leukocytes,Ua: NEGATIVE
Nitrite: NEGATIVE
Protein, ur: NEGATIVE mg/dL
Specific Gravity, Urine: 1.002 — ABNORMAL LOW (ref 1.005–1.030)
pH: 7 (ref 5.0–8.0)

## 2024-11-13 LAB — RESP PANEL BY RT-PCR (RSV, FLU A&B, COVID)  RVPGX2
Influenza A by PCR: NEGATIVE
Influenza B by PCR: NEGATIVE
Resp Syncytial Virus by PCR: NEGATIVE
SARS Coronavirus 2 by RT PCR: NEGATIVE

## 2024-11-13 LAB — AMMONIA: Ammonia: 34 umol/L (ref 9–35)

## 2024-11-13 MED ORDER — PANTOPRAZOLE SODIUM 40 MG IV SOLR
40.0000 mg | Freq: Once | INTRAVENOUS | Status: AC
  Administered 2024-11-13: 40 mg via INTRAVENOUS
  Filled 2024-11-13: qty 10

## 2024-11-13 MED ORDER — SODIUM CHLORIDE 1 G PO TABS: 1.0000 g | ORAL_TABLET | Freq: Every day | ORAL | 0 refills | Status: AC

## 2024-11-13 MED ORDER — IOHEXOL 300 MG/ML  SOLN
100.0000 mL | Freq: Once | INTRAMUSCULAR | Status: AC | PRN
  Administered 2024-11-13: 100 mL via INTRAVENOUS

## 2024-11-13 MED ORDER — PANTOPRAZOLE SODIUM 20 MG PO TBEC: 20.0000 mg | DELAYED_RELEASE_TABLET | Freq: Every day | ORAL | 0 refills | Status: AC

## 2024-11-13 MED ORDER — SODIUM CHLORIDE 0.9 % IV BOLUS
1000.0000 mL | Freq: Once | INTRAVENOUS | Status: AC
  Administered 2024-11-13: 1000 mL via INTRAVENOUS

## 2024-11-13 MED ORDER — IBUPROFEN 200 MG PO TABS
600.0000 mg | ORAL_TABLET | Freq: Once | ORAL | Status: AC
  Administered 2024-11-13: 600 mg via ORAL
  Filled 2024-11-13: qty 3

## 2024-11-13 NOTE — ED Provider Notes (Signed)
 " [place] EMERGENCY DEPARTMENT AT [facility] Provider Note  CSN: [id number] Arrival date & time: 11/13/24 1349  Chief Complaint(s) Abnormal Lab  HPI Jennifer Barron is a 44 y.o. female with past medical history as below, significant for hypertension, migraine, PCOS, GAD, hepatic steatosis, alcohol abuse who presents to the ED with complaint of fever, abnormal labs  Patient is currently at [facility] for alcohol abuse, she has been sober for around 10 days.  She was sent by care team there for hyponatremia.  Patient reports intermittent fevers over the past few days.  No abdominal pain.  No vomiting or nausea.  No change in bowel or bladder function.  She was recent treated for UTI and the symptoms have since improved.  No cough, congestion, rhinorrhea.  No chest pain or dyspnea.  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 Authorizing Provider  pantoprazole  (PROTONIX ) 20 MG tablet Take 1 tablet (20 mg total) by mouth daily. 11/13/24 11/27/24 Yes Elnor Jayson LABOR, DO  sodium chloride  1 g tablet Take 1 tablet (1 g total) by mouth daily with breakfast for 4 doses. 11/14/24  11/18/24 Yes Elnor Jayson A, DO  amLODipine  (NORVASC ) 5 MG tablet TAKE 1 TABLET BY MOUTH DAILY 05/28/24   McDonough, Lauren K, PA-C  buPROPion  (WELLBUTRIN  XL) 150 MG 24 hr tablet Take 1 tablet (150 mg total) by mouth daily. 04/05/24   McDonough, Tinnie POUR, PA-C  cephALEXin  (KEFLEX ) 500 MG capsule Take 1 capsule (500 mg total) by mouth 3 (three) times daily. 08/28/24   Paduchowski, Kevin, MD  cholecalciferol (VITAMIN D ) 1000 units tablet Take 1 tablet (1,000 Units total) by mouth daily. 02/01/18   Babara Call, MD  lactulose  (CHRONULAC ) 10 GM/15ML solution TAKE 15 MLS (10 G TOTAL) BY MOUTH 2 (TWO) TIMES DAILY. 09/13/24   Khan, Fozia M, MD  metoCLOPramide  (REGLAN ) 10 MG tablet Take 1 tablet (10 mg total) by mouth every 8 (eight) hours as needed for nausea. 10/29/24 10/29/25  McDonough, Lauren K, PA-C  spironolactone (ALDACTONE) 50 MG tablet  01/24/18   [provider]  Suvorexant  (BELSOMRA ) 10 MG TABS Take 1 tablet (10 mg total) by mouth at bedtime as needed (insomnia). 10/11/24   Liana Fish, NP  venlafaxine  XR (EFFEXOR -XR) 75 MG 24 hr capsule Take 1 capsule (75 mg total) by mouth daily with breakfast. 10/11/24   Liana Fish, NP  Past Surgical History Past Surgical History:  Procedure Laterality Date   abdominal laproscopy     ADENOIDECTOMY     twice   BREAST BIOPSY Right 04/02/2024   MM RT BREAST BX W LOC DEV 1ST LESION IMAGE BX SPEC STEREO GUIDE 04/02/2024 [facility]   TONSILLECTOMY Bilateral 1987   WISDOM TOOTH EXTRACTION  2009   Family History Family History  Problem Relation Age of Onset   Breast cancer Mother    Cancer Father    Ovarian cancer Paternal Grandmother    Cancer Paternal Grandmother    Prostate cancer Paternal Grandfather    Cancer Paternal Grandfather     Social History Social History[1] Allergies Patient has no known  allergies.  Review of Systems A thorough review of systems was obtained and all systems are negative except as noted in the HPI and PMH.   Physical Exam Vital Signs  I have reviewed the triage vital signs BP 111/73 (BP Location: Right Arm)   Pulse 92   Temp 98.9 F (37.2 C)   Resp 16   SpO2 96%  Physical Exam Vitals and nursing note reviewed.  Constitutional:      General: She is not in acute distress.    Appearance: Normal appearance. She is well-developed. She is not ill-appearing.  HENT:     Head: Normocephalic and atraumatic.     Right Ear: External ear normal.     Left Ear: External ear normal.     Nose: Nose normal.     Mouth/Throat:     Mouth: Mucous membranes are moist.  Eyes:     General: Scleral icterus present.        Right eye: No discharge.        Left eye: No discharge.  Cardiovascular:     Rate and Rhythm: Normal rate.  Pulmonary:     Effort: Pulmonary effort is normal. No respiratory distress.     Breath sounds: No stridor.  Abdominal:     General: Abdomen is flat. There is no distension.     Palpations: Abdomen is soft.     Tenderness: There is abdominal tenderness. There is no guarding.   Musculoskeletal:        General: No deformity.     Cervical back: No rigidity.  Skin:    General: Skin is warm and dry.     Coloration: Skin is not cyanotic, jaundiced or pale.  Neurological:     Mental Status: She is alert.  Psychiatric:        Speech: Speech normal.        Behavior: Behavior normal. Behavior is cooperative.     ED Results and Treatments Labs (all labs ordered are listed, but only abnormal results are displayed) Labs Reviewed  CBC WITH DIFFERENTIAL/PLATELET - Abnormal; Notable for the following components:      Result Value   RBC 3.09 (*)    Hemoglobin 10.6 (*)    HCT 30.9 (*)    MCH 34.3 (*)    Platelets 473 (*)    Monocytes Absolute 1.1 (*)    All other components within normal limits  COMPREHENSIVE METABOLIC PANEL WITH GFR -  Abnormal; Notable for the following components:   Sodium 128 (*)    Chloride 93 (*)    Glucose, Bld 104 (*)    BUN <5 (*)    Creatinine, Ser 0.43 (*)    Total Protein 6.4 (*)    AST 307 (*)    ALT 109 (*)  Alkaline Phosphatase 165 (*)    Total Bilirubin 4.4 (*)    All other components within normal limits  URINALYSIS, ROUTINE W REFLEX MICROSCOPIC - Abnormal; Notable for the following components:   Specific Gravity, Urine 1.002 (*)    All other components within normal limits  RESP PANEL BY RT-PCR (RSV, FLU A&B, COVID)  RVPGX2  CULTURE, BLOOD (ROUTINE X 2)  CULTURE, BLOOD (ROUTINE X 2)  HCG, SERUM, QUALITATIVE  AMMONIA                                                                                                                          Radiology US  Abdomen Limited RUQ (LIVER/GB) Result Date: 11/13/2024 EXAM: Right Upper Quadrant Abdominal Ultrasound 11/13/2024 07:41:44 PM TECHNIQUE: Real-time ultrasonography of the right upper quadrant of the abdomen was performed. COMPARISON: CT today. CLINICAL HISTORY: RUQ abdominal pain. FINDINGS: LIVER: Diffusely increased echotexture throughout the liver is compatible with fatty infiltration. No intrahepatic biliary ductal dilatation. No evidence of mass. Hepatopetal flow in the portal vein. BILIARY SYSTEM: Sludge is seen within the gallbladder with a small amount of pericholecystic fluid and gallbladder wall thickening measuring up to 5 mm. No visible stones or sonographic Murphy sign. Common bile duct is within normal limits measuring 2 mm. RIGHT KIDNEY: No hydronephrosis. No echogenic calculi. No mass. PANCREAS: Visualized portions of the pancreas are unremarkable. OTHER: No right upper quadrant ascites. IMPRESSION: 1. Gallbladder sludge with wall thickening and a small amount of pericholecystic fluid, equivocal for acute cholecystitis. No visible gallstones. 2. Hepatic steatosis. Electronically signed by: Franky Crease MD 11/13/2024 07:44 PM EST RP  Workstation: [id number]   CT ABDOMEN PELVIS W CONTRAST Result Date: 11/13/2024 EXAM: CT ABDOMEN AND PELVIS WITH CONTRAST 11/13/2024 06:31:50 PM TECHNIQUE: CT of the abdomen and pelvis was performed with the administration of 100 mL of iohexol  (OMNIPAQUE ) 300 MG/ML solution. Multiplanar reformatted images are provided for review. Automated exposure control, iterative reconstruction, and/or weight-based adjustment of the mA/kV was utilized to reduce the radiation dose to as low as reasonably achievable. COMPARISON: 08/28/2024 CLINICAL HISTORY: Abdominal pain, acute, nonlocalized; abd pain, hyperbili, fever; etoh abuse FINDINGS: LOWER CHEST: No acute abnormality. LIVER: Geographic hypoattenuation within the liver parenchyma, likely reflecting geographic hepatic steatosis. Subtle nodularity along the liver capsule about the gallbladder fundus. Apparent narrowing also noted at the intrahepatic Inferior Vena Cava (IVC) and the hepatic veins at their IVC confluence. GALLBLADDER AND BILE DUCTS: Focal gallbladder wall edema along the hepatic surface of the gallbladder fundus. No radiopaque stones. No biliary ductal dilatation. SPLEEN: Borderline splenomegaly. PANCREAS: No acute abnormality. ADRENAL GLANDS: No acute abnormality. KIDNEYS, URETERS AND BLADDER: No stones in the kidneys or ureters. No hydronephrosis. No perinephric or periureteral stranding. The urinary bladder is distended without focal abnormality. GI AND BOWEL: Stomach demonstrates no acute abnormality. There is no bowel obstruction. PERITONEUM AND RETROPERITONEUM: Small volume free fluid layering in the paracolic gutters bilaterally and the rectouterine pouch. No free air. VASCULATURE: Aorta is normal in  caliber. Apparent narrowing also noted at the intrahepatic Inferior Vena Cava (IVC) and the hepatic veins at their IVC confluence. LYMPH NODES: No lymphadenopathy. REPRODUCTIVE ORGANS: The uterus and ovaries are within normal limits for patient's age.  BONES AND SOFT TISSUES: Multilevel Thoracolumbar osteophytosis. No focal soft tissue abnormality. IMPRESSION: 1. Focal gallbladder wall edema along the hepatic surface of the gallbladder fundus, which the absence of radiopaque gallstones, may be due to acute hepatitis, chronic liver disease, or hypoproteinemia. 2. Subtle imaging changes worrisome for cirrhosis. Small volume ascites in the paracolic gutters and pelvis may be due to portal hypertension. Electronically signed by: Rogelia Myers MD 11/13/2024 06:54 PM EST RP Workstation: [id number]   DG Chest Portable 1 View Result Date: 11/13/2024 CLINICAL DATA:  Fever. EXAM: PORTABLE CHEST 1 VIEW COMPARISON:  October 04, 2024 FINDINGS: The heart size and mediastinal contours are within normal limits. Both lungs are clear. The visualized skeletal structures are unremarkable. IMPRESSION: No acute cardiopulmonary disease. Electronically Signed   By: Suzen Dials M.D.   On: 11/13/2024 18:35    Pertinent labs & imaging results that were available during my care of the patient were reviewed by me and considered in my medical decision making (see MDM for details).  Medications Ordered in ED Medications  sodium chloride  0.9 % bolus 1,000 mL (0 mLs Intravenous Stopped 11/13/24 1951)  ibuprofen  (ADVIL ) tablet 600 mg (600 mg Oral Given 11/13/24 1758)  pantoprazole  (PROTONIX ) injection 40 mg (40 mg Intravenous Given 11/13/24 1757)  iohexol  (OMNIPAQUE ) 300 MG/ML solution 100 mL (100 mLs Intravenous Contrast Given 11/13/24 1825)                                                                                                                                     Procedures Procedures  (including critical care time)  Medical Decision Making / ED Course    Medical Decision Making:    Jennifer Barron is a 44 y.o. female  with past medical history as below, significant for hypertension, migraine, PCOS, GAD, hepatic steatosis, alcohol abuse who presents to  the ED with complaint of fever, abnormal labs. The complaint involves an extensive differential diagnosis and also carries with it a high risk of complications and morbidity.  Serious etiology was considered. Ddx includes but is not limited to: Differential diagnosis includes but is not exclusive to acute cholecystitis, intrathoracic causes for epigastric abdominal pain, gastritis, duodenitis, pancreatitis, small bowel or large bowel obstruction, abdominal aortic aneurysm, hernia, gastritis, etc.   Complete initial physical exam performed, notably the patient was in no acute distress.    Reviewed and confirmed nursing documentation for past medical history, family history, social history.  Vital signs reviewed.    Abdominal pain Scleral icterus Fever> - History of heavy alcohol use, she has been sober for 10 days.  She is in outpatient detox.  Fever intermittently over the past few days, abdominal pain intermittent.  No  vomiting or nausea. - Hyperbilirubinemia at 4.4, LFTs are also elevated - CTAP > concerning for liver disease, abnormality noted gallbladder - Right upper quadrant ultrasound without evidence of cholecystitis.  CBD is not dilated - Patient with imaging finding concerning for cirrhosis likely secondary to chronic alcohol abuse.  She is currently been sober for around 10 days.  She is at [facility]. - She otherwise is stable.  Encourage patient follow-up with GI and general surgery - f/u GI regarding elevated LFTs and bilirubin - She has some mild epigastric burning pain, no blood in stool or hematemesis.  Will start PPI. Bland diet. Possible gastritis. F/u GI  Fever> - Fever over the past few days - RVP > negative - no leukocytosis, she is HDS, well appearing, non-toxic - Unclear etiology of fever, favor viral infection.  Hyponatremia> - Patient reports at facility sodium was 123-126 range, Today 128 - Encourage close monitoring, sodium supplementation - Check sodium  in the next couple days to ensure improvement     9:30 PM:  I have discussed the diagnosis/risks/treatment options with the patient.  Evaluation and diagnostic testing in the emergency department does not suggest an emergent condition requiring admission or immediate intervention beyond what has been performed at this time.  They will follow up with PCP, GI, GEN surge. We also discussed returning to the ED immediately if new or worsening sx occur. We discussed the sx which are most concerning (e.g., sudden worsening pain, fever, inability to tolerate by mouth) that necessitate immediate return.    The patient appears reasonably screened and/or stabilized for discharge and I doubt any other medical condition or other [facility] requiring further screening, evaluation, or treatment in the ED at this time prior to discharge.                 Additional history obtained: -Additional history obtained from na -External records from outside source obtained and reviewed including: Chart review including previous notes, labs, imaging, consultation notes including  Prior labs and imaging   Lab Tests: -I ordered, reviewed, and interpreted labs.   The pertinent results include:   Labs Reviewed  CBC WITH DIFFERENTIAL/PLATELET - Abnormal; Notable for the following components:      Result Value   RBC 3.09 (*)    Hemoglobin 10.6 (*)    HCT 30.9 (*)    MCH 34.3 (*)    Platelets 473 (*)    Monocytes Absolute 1.1 (*)    All other components within normal limits  COMPREHENSIVE METABOLIC PANEL WITH GFR - Abnormal; Notable for the following components:   Sodium 128 (*)    Chloride 93 (*)    Glucose, Bld 104 (*)    BUN <5 (*)    Creatinine, Ser 0.43 (*)    Total Protein 6.4 (*)    AST 307 (*)    ALT 109 (*)    Alkaline Phosphatase 165 (*)    Total Bilirubin 4.4 (*)    All other components within normal limits  URINALYSIS, ROUTINE W REFLEX MICROSCOPIC - Abnormal; Notable for the following  components:   Specific Gravity, Urine 1.002 (*)    All other components within normal limits  RESP PANEL BY RT-PCR (RSV, FLU A&B, COVID)  RVPGX2  CULTURE, BLOOD (ROUTINE X 2)  CULTURE, BLOOD (ROUTINE X 2)  HCG, SERUM, QUALITATIVE  AMMONIA    Notable for as above  EKG   EKG Interpretation Date/Time:  Wednesday November 13 2024 14:03:24 EST Ventricular Rate:  113  PR Interval:  169 QRS Duration:  80 QT Interval:  314 QTC Calculation: 431 R Axis:   128  Text Interpretation: Sinus tachycardia Right axis deviation Low voltage, precordial leads Confirmed by Elnor Savant ([id number]) on 11/13/2024 6:27:11 PM         Imaging Studies ordered: I ordered imaging studies including right upper quadrant ultrasound, CT abdomen pelvis, chest x-ray I independently visualized the following imaging with scope of interpretation limited to determining acute life threatening conditions related to emergency care; findings noted above I agree with the radiologist interpretation If any imaging was obtained with contrast I closely monitored patient for any possible adverse reaction a/w contrast administration in the emergency department   Medicines ordered and prescription drug management: Meds ordered this encounter  Medications   sodium chloride  0.9 % bolus 1,000 mL   ibuprofen  (ADVIL ) tablet 600 mg   pantoprazole  (PROTONIX ) injection 40 mg   iohexol  (OMNIPAQUE ) 300 MG/ML solution 100 mL   pantoprazole  (PROTONIX ) 20 MG tablet    Sig: Take 1 tablet (20 mg total) by mouth daily.    Dispense:  14 tablet    Refill:  0   sodium chloride  1 g tablet    Sig: Take 1 tablet (1 g total) by mouth daily with breakfast for 4 doses.    Dispense:  4 tablet    Refill:  0    -I have reviewed the patients home medicines and have made adjustments as needed   Consultations Obtained: Not applicable  Cardiac Monitoring: Continuous pulse oximetry interpreted by myself, 98% on room air.    Social Determinants of  Health:  Diagnosis or treatment significantly limited by social determinants of health: obesity   Reevaluation: After the interventions noted above, I reevaluated the patient and found that they have improved  Co morbidities that complicate the patient evaluation  Past Medical History:  Diagnosis Date   Endometriosis    Hypertension    Migraine    PCOS (polycystic ovarian syndrome)       Dispostion: Disposition decision including need for hospitalization was considered, and patient discharged from emergency department.    Final Clinical Impression(s) / ED Diagnoses Final diagnoses:  Hyponatremia  Liver disease  Elevated liver enzymes  Hyperbilirubinemia         [1]  Social History Tobacco Use   Smoking status: Never   Smokeless tobacco: Never  Vaping Use   Vaping status: Never Used  Substance Use Topics   Alcohol use: Yes    Comment: social   Drug use: No     Elnor Savant LABOR, DO 11/13/24 2131  "

## 2024-11-13 NOTE — ED Triage Notes (Signed)
 Patient BIBA coming from [facility], day 10 of detox, Na 122, BP 102/60 HR 110 T 99.2 CBG 124. Patient is alert and oriented x 4. Airway patent, respirations even and unlabored. Skin normal, warm and dry.

## 2024-11-13 NOTE — ED Notes (Signed)
 Pt requesting discharge paperwork and stated she was ready to leave. Informed pt we would attempt to arrange contact via [facility]. [facility] stated they would not be able to take pt tonight and would be able to take pt in the a.m.  Attempted to call [facility] with no answer. Pt stated she did not want to wait and requested to leave via cab. [employer] contacted to pick up pt.

## 2024-11-13 NOTE — Discharge Instructions (Addendum)
 It was a pleasure caring for you today in the emergency department.  Please have your sodium rechecked in the next couple days.  Be sure to add extra salt to your diet.  I have prescribed salt tablets to take over the next few days.  I have also started you on antacid medication to take over the next 2 weeks.  Your liver enzymes and bilirubin were elevated, this is a sign of liver disease.  Please continue to remain abstinent from alcohol.  Follow-up with gastroenterology in the office  Your gallbladder was somewhat abnormal, no gallstones were noted.  I have given you referral to general surgery if needed if your symptoms worsen  The source of your fever is unknown at this time, likely a viral infection..  Drink plenty of liquids and get plenty of rest over the next few days.  Please return to the emergency department for any worsening or worrisome symptoms.

## 2024-11-14 ENCOUNTER — Encounter: Payer: Self-pay | Admitting: Oncology

## 2024-11-14 ENCOUNTER — Telehealth: Payer: Self-pay | Admitting: Physician Assistant

## 2024-11-14 NOTE — Telephone Encounter (Signed)
 Left patient vm to schedule ED follow up-Jennifer Barron

## 2024-11-19 ENCOUNTER — Telehealth: Payer: Self-pay | Admitting: Physician Assistant

## 2024-11-19 LAB — CULTURE, BLOOD (ROUTINE X 2)
Culture: NO GROWTH
Special Requests: ADEQUATE

## 2024-11-19 NOTE — Telephone Encounter (Signed)
Left 2nd vm to schedule ED follow up-Toni

## 2024-11-21 ENCOUNTER — Telehealth: Payer: Self-pay | Admitting: Physician Assistant

## 2024-11-21 NOTE — Progress Notes (Signed)
 Jennifer Barron                                          MRN: [id number]   11/21/2024   The [facility] Quality Team Specialist reviewed this patient medical record for the purposes of chart review for care gap closure. The following were reviewed: chart review for care gap closure-controlling blood pressure.    [facility] Quality Team

## 2024-11-21 NOTE — Telephone Encounter (Signed)
Left 2nd vm to schedule ED follow up-Toni

## 2025-04-07 ENCOUNTER — Encounter: Admitting: Physician Assistant
